# Patient Record
Sex: Female | Born: 1985
Health system: Southern US, Community
[De-identification: ages and names within clinical notes are randomized; demographics above are authoritative.]

## PROBLEM LIST (undated history)

## (undated) DIAGNOSIS — Z87448 Personal history of other diseases of urinary system: Secondary | ICD-10-CM

## (undated) DIAGNOSIS — J45909 Unspecified asthma, uncomplicated: Secondary | ICD-10-CM

## (undated) DIAGNOSIS — Z8619 Personal history of other infectious and parasitic diseases: Secondary | ICD-10-CM

## (undated) DIAGNOSIS — Z872 Personal history of diseases of the skin and subcutaneous tissue: Secondary | ICD-10-CM

## (undated) DIAGNOSIS — Z87898 Personal history of other specified conditions: Secondary | ICD-10-CM

## (undated) DIAGNOSIS — G43709 Chronic migraine without aura, not intractable, without status migrainosus: Secondary | ICD-10-CM

## (undated) DIAGNOSIS — Z8719 Personal history of other diseases of the digestive system: Secondary | ICD-10-CM

## (undated) DIAGNOSIS — Q820 Hereditary lymphedema: Secondary | ICD-10-CM

## (undated) DIAGNOSIS — Z973 Presence of spectacles and contact lenses: Secondary | ICD-10-CM

## (undated) DIAGNOSIS — IMO0002 Reserved for concepts with insufficient information to code with codable children: Secondary | ICD-10-CM

## (undated) DIAGNOSIS — D509 Iron deficiency anemia, unspecified: Secondary | ICD-10-CM

## (undated) DIAGNOSIS — N840 Polyp of corpus uteri: Secondary | ICD-10-CM

## (undated) DIAGNOSIS — D259 Leiomyoma of uterus, unspecified: Secondary | ICD-10-CM

## (undated) DIAGNOSIS — K219 Gastro-esophageal reflux disease without esophagitis: Secondary | ICD-10-CM

## (undated) DIAGNOSIS — Z8742 Personal history of other diseases of the female genital tract: Secondary | ICD-10-CM

## (undated) HISTORY — PX: WISDOM TOOTH EXTRACTION: SHX21

---
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
## 2003-04-26 ENCOUNTER — Emergency Department (HOSPITAL_COMMUNITY): Admission: EM | Admit: 2003-04-26 | Discharge: 2003-04-27 | Payer: Self-pay | Admitting: *Deleted

## 2003-08-21 ENCOUNTER — Emergency Department (HOSPITAL_COMMUNITY): Admission: EM | Admit: 2003-08-21 | Discharge: 2003-08-22 | Payer: Self-pay | Admitting: Emergency Medicine

## 2003-12-13 ENCOUNTER — Emergency Department (HOSPITAL_COMMUNITY): Admission: EM | Admit: 2003-12-13 | Discharge: 2003-12-13 | Payer: Self-pay | Admitting: Emergency Medicine

## 2004-02-18 ENCOUNTER — Ambulatory Visit (HOSPITAL_COMMUNITY): Admission: RE | Admit: 2004-02-18 | Discharge: 2004-02-18 | Payer: Self-pay | Admitting: Family Medicine

## 2004-02-18 ENCOUNTER — Ambulatory Visit: Payer: Self-pay | Admitting: Family Medicine

## 2004-02-18 ENCOUNTER — Inpatient Hospital Stay (HOSPITAL_COMMUNITY): Admission: EM | Admit: 2004-02-18 | Discharge: 2004-02-20 | Payer: Self-pay | Admitting: Family Medicine

## 2004-07-10 ENCOUNTER — Emergency Department (HOSPITAL_COMMUNITY): Admission: EM | Admit: 2004-07-10 | Discharge: 2004-07-10 | Payer: Self-pay | Admitting: Family Medicine

## 2004-12-21 ENCOUNTER — Emergency Department (HOSPITAL_COMMUNITY): Admission: EM | Admit: 2004-12-21 | Discharge: 2004-12-21 | Payer: Self-pay | Admitting: Emergency Medicine

## 2004-12-23 ENCOUNTER — Emergency Department (HOSPITAL_COMMUNITY): Admission: EM | Admit: 2004-12-23 | Discharge: 2004-12-23 | Payer: Self-pay | Admitting: Emergency Medicine

## 2004-12-24 ENCOUNTER — Ambulatory Visit (HOSPITAL_COMMUNITY): Admission: RE | Admit: 2004-12-24 | Discharge: 2004-12-24 | Payer: Self-pay | Admitting: Emergency Medicine

## 2005-01-16 ENCOUNTER — Encounter (INDEPENDENT_AMBULATORY_CARE_PROVIDER_SITE_OTHER): Payer: Self-pay | Admitting: Internal Medicine

## 2005-01-19 ENCOUNTER — Inpatient Hospital Stay (HOSPITAL_COMMUNITY): Admission: AD | Admit: 2005-01-19 | Discharge: 2005-01-19 | Payer: Self-pay | Admitting: *Deleted

## 2005-02-02 ENCOUNTER — Emergency Department (HOSPITAL_COMMUNITY): Admission: EM | Admit: 2005-02-02 | Discharge: 2005-02-02 | Payer: Self-pay | Admitting: *Deleted

## 2005-02-04 ENCOUNTER — Emergency Department (HOSPITAL_COMMUNITY): Admission: EM | Admit: 2005-02-04 | Discharge: 2005-02-04 | Payer: Self-pay | Admitting: Family Medicine

## 2005-02-06 ENCOUNTER — Emergency Department (HOSPITAL_COMMUNITY): Admission: EM | Admit: 2005-02-06 | Discharge: 2005-02-06 | Payer: Self-pay | Admitting: Family Medicine

## 2005-04-03 ENCOUNTER — Emergency Department (HOSPITAL_COMMUNITY): Admission: EM | Admit: 2005-04-03 | Discharge: 2005-04-04 | Payer: Self-pay | Admitting: Emergency Medicine

## 2005-06-19 ENCOUNTER — Emergency Department (HOSPITAL_COMMUNITY): Admission: EM | Admit: 2005-06-19 | Discharge: 2005-06-19 | Payer: Self-pay | Admitting: Emergency Medicine

## 2005-07-16 ENCOUNTER — Ambulatory Visit: Payer: Self-pay | Admitting: Internal Medicine

## 2005-08-05 ENCOUNTER — Ambulatory Visit: Payer: Self-pay | Admitting: Internal Medicine

## 2005-08-05 ENCOUNTER — Ambulatory Visit: Payer: Self-pay | Admitting: *Deleted

## 2005-08-06 ENCOUNTER — Ambulatory Visit (HOSPITAL_COMMUNITY): Admission: RE | Admit: 2005-08-06 | Discharge: 2005-08-06 | Payer: Self-pay | Admitting: Internal Medicine

## 2005-08-15 ENCOUNTER — Ambulatory Visit: Payer: Self-pay | Admitting: Internal Medicine

## 2006-02-18 ENCOUNTER — Ambulatory Visit: Payer: Self-pay | Admitting: Internal Medicine

## 2006-03-18 DIAGNOSIS — L03115 Cellulitis of right lower limb: Secondary | ICD-10-CM

## 2006-03-19 ENCOUNTER — Emergency Department (HOSPITAL_COMMUNITY): Admission: EM | Admit: 2006-03-19 | Discharge: 2006-03-19 | Payer: Self-pay | Admitting: Family Medicine

## 2006-03-19 ENCOUNTER — Encounter: Payer: Self-pay | Admitting: Vascular Surgery

## 2006-03-20 ENCOUNTER — Ambulatory Visit: Payer: Self-pay | Admitting: Internal Medicine

## 2006-03-24 ENCOUNTER — Ambulatory Visit: Payer: Self-pay | Admitting: Internal Medicine

## 2006-06-25 ENCOUNTER — Ambulatory Visit: Payer: Self-pay | Admitting: Internal Medicine

## 2006-07-17 ENCOUNTER — Ambulatory Visit: Payer: Self-pay | Admitting: Internal Medicine

## 2006-07-26 ENCOUNTER — Encounter (INDEPENDENT_AMBULATORY_CARE_PROVIDER_SITE_OTHER): Payer: Self-pay | Admitting: Internal Medicine

## 2006-07-26 DIAGNOSIS — Z77011 Contact with and (suspected) exposure to lead: Secondary | ICD-10-CM | POA: Insufficient documentation

## 2006-07-26 DIAGNOSIS — G5600 Carpal tunnel syndrome, unspecified upper limb: Secondary | ICD-10-CM | POA: Insufficient documentation

## 2006-07-26 DIAGNOSIS — G43909 Migraine, unspecified, not intractable, without status migrainosus: Secondary | ICD-10-CM

## 2006-07-26 DIAGNOSIS — J45909 Unspecified asthma, uncomplicated: Secondary | ICD-10-CM | POA: Insufficient documentation

## 2006-08-06 ENCOUNTER — Ambulatory Visit: Payer: Self-pay | Admitting: Internal Medicine

## 2006-08-15 ENCOUNTER — Ambulatory Visit (HOSPITAL_COMMUNITY): Admission: RE | Admit: 2006-08-15 | Discharge: 2006-08-15 | Payer: Self-pay | Admitting: Internal Medicine

## 2006-08-17 DIAGNOSIS — Z8619 Personal history of other infectious and parasitic diseases: Secondary | ICD-10-CM

## 2006-08-17 HISTORY — DX: Personal history of other infectious and parasitic diseases: Z86.19

## 2006-08-22 ENCOUNTER — Emergency Department (HOSPITAL_COMMUNITY): Admission: EM | Admit: 2006-08-22 | Discharge: 2006-08-22 | Payer: Self-pay | Admitting: Emergency Medicine

## 2006-09-16 ENCOUNTER — Encounter (INDEPENDENT_AMBULATORY_CARE_PROVIDER_SITE_OTHER): Payer: Self-pay | Admitting: *Deleted

## 2006-09-16 ENCOUNTER — Inpatient Hospital Stay (HOSPITAL_COMMUNITY): Admission: EM | Admit: 2006-09-16 | Discharge: 2006-09-28 | Payer: Self-pay | Admitting: Family Medicine

## 2006-09-16 ENCOUNTER — Ambulatory Visit: Payer: Self-pay | Admitting: Vascular Surgery

## 2006-09-20 ENCOUNTER — Encounter (INDEPENDENT_AMBULATORY_CARE_PROVIDER_SITE_OTHER): Payer: Self-pay | Admitting: *Deleted

## 2006-10-08 ENCOUNTER — Ambulatory Visit: Payer: Self-pay | Admitting: Internal Medicine

## 2006-10-09 ENCOUNTER — Encounter (INDEPENDENT_AMBULATORY_CARE_PROVIDER_SITE_OTHER): Payer: Self-pay | Admitting: Internal Medicine

## 2006-10-09 LAB — CONVERTED CEMR LAB
ALT: <8 units/L (ref 0–35)
AST: 11 units/L (ref 0–37)
Basophils Absolute: 0.1 10*3/uL (ref 0.0–0.1)
Basophils Relative: 1 % (ref 0–1)
Calcium: 8.7 mg/dL (ref 8.4–10.5)
Chloride: 108 meq/L (ref 96–112)
Creatinine, Ser: 0.89 mg/dL (ref 0.40–1.20)
MCHC: 30 g/dL (ref 30.0–36.0)
Monocytes Absolute: 0.6 10*3/uL (ref 0.2–0.7)
Neutro Abs: 2.7 10*3/uL (ref 1.7–7.7)
Neutrophils Relative %: 44 % (ref 43–77)
Platelets: 425 10*3/uL — ABNORMAL HIGH (ref 150–400)
Potassium: 4.1 meq/L (ref 3.5–5.3)
RDW: 14.1 % — ABNORMAL HIGH (ref 11.5–14.0)

## 2006-10-13 ENCOUNTER — Ambulatory Visit: Payer: Self-pay | Admitting: Internal Medicine

## 2006-10-13 ENCOUNTER — Other Ambulatory Visit: Admission: RE | Admit: 2006-10-13 | Discharge: 2006-10-13 | Payer: Self-pay | Admitting: Family Medicine

## 2006-11-06 ENCOUNTER — Encounter: Payer: Self-pay | Admitting: Internal Medicine

## 2006-12-03 ENCOUNTER — Encounter (INDEPENDENT_AMBULATORY_CARE_PROVIDER_SITE_OTHER): Payer: Self-pay | Admitting: *Deleted

## 2007-01-19 ENCOUNTER — Telehealth (INDEPENDENT_AMBULATORY_CARE_PROVIDER_SITE_OTHER): Payer: Self-pay | Admitting: Internal Medicine

## 2007-03-28 ENCOUNTER — Encounter (INDEPENDENT_AMBULATORY_CARE_PROVIDER_SITE_OTHER): Payer: Self-pay | Admitting: Internal Medicine

## 2007-03-30 ENCOUNTER — Encounter (INDEPENDENT_AMBULATORY_CARE_PROVIDER_SITE_OTHER): Payer: Self-pay | Admitting: Internal Medicine

## 2007-10-23 ENCOUNTER — Emergency Department (HOSPITAL_COMMUNITY): Admission: EM | Admit: 2007-10-23 | Discharge: 2007-10-24 | Payer: Self-pay | Admitting: Emergency Medicine

## 2008-09-28 IMAGING — CR DG FOOT COMPLETE 3+V*L*
3 series · 3 of 3 positions shown · non-contrast
Comparison: none

HISTORY: Left foot pain and swelling

LEFT FOOT 3 VIEWS:
Mineralization normal.
Joint spaces preserved.
Diffuse soft tissue swelling of left foot, greatest at dorsum.
No fracture, dislocation, or bone destruction.
No radiopaque foreign body or soft tissue gas.

[t foot ap left]
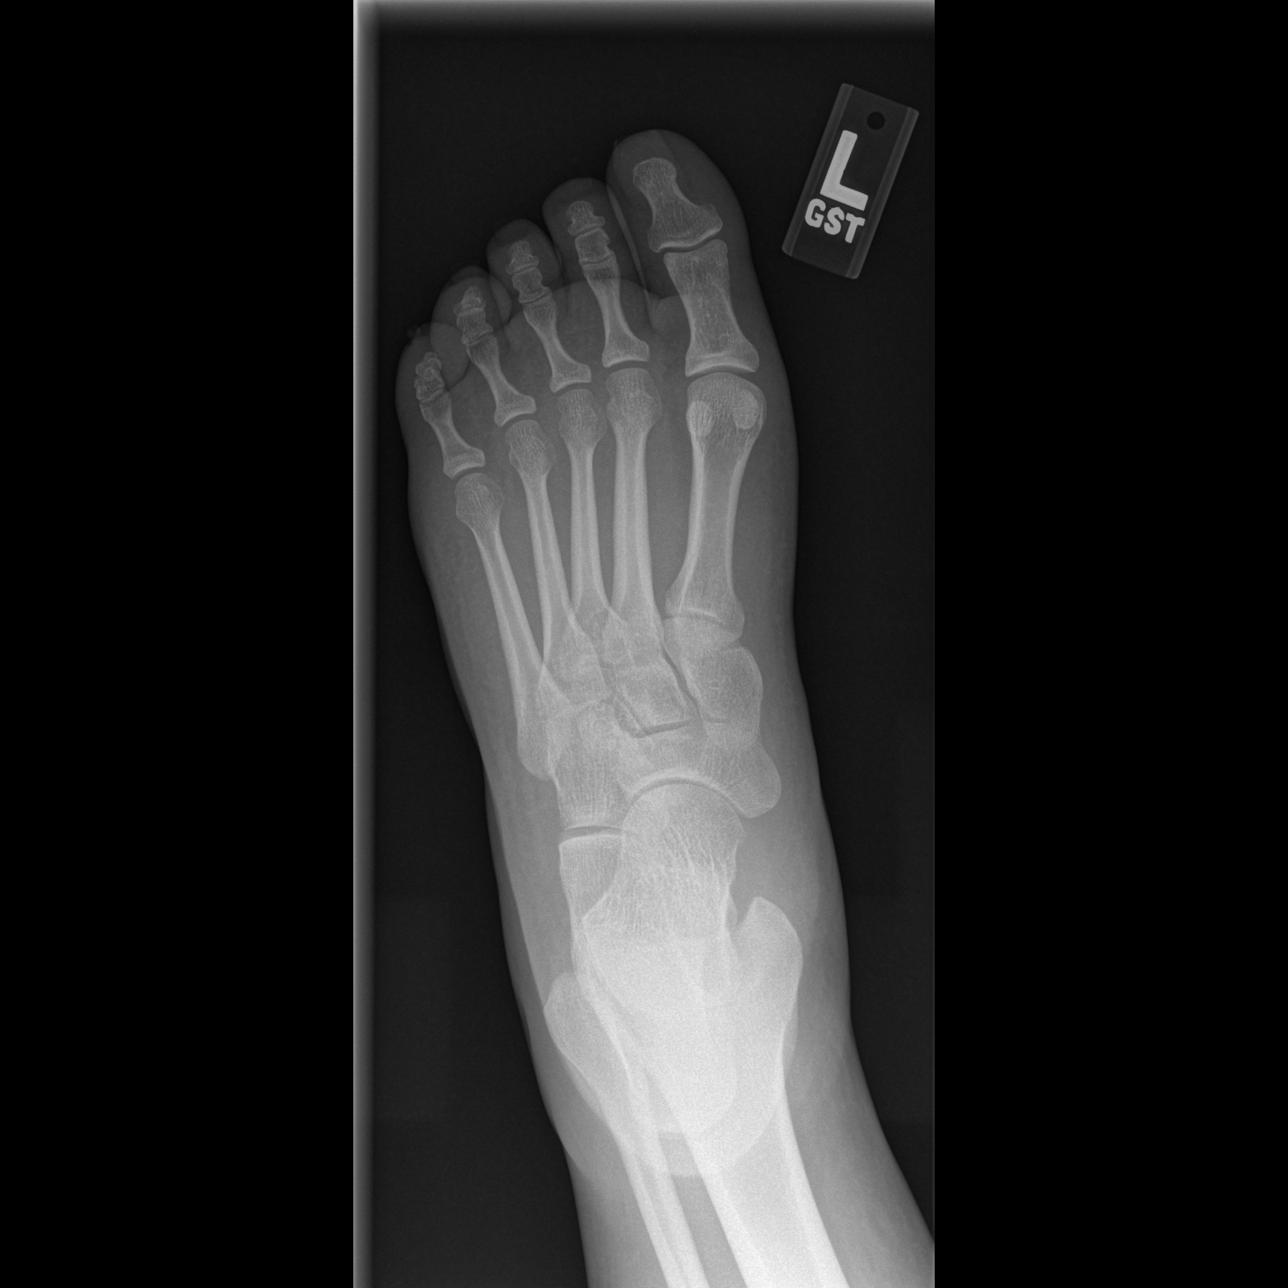

[t foot oblique left]
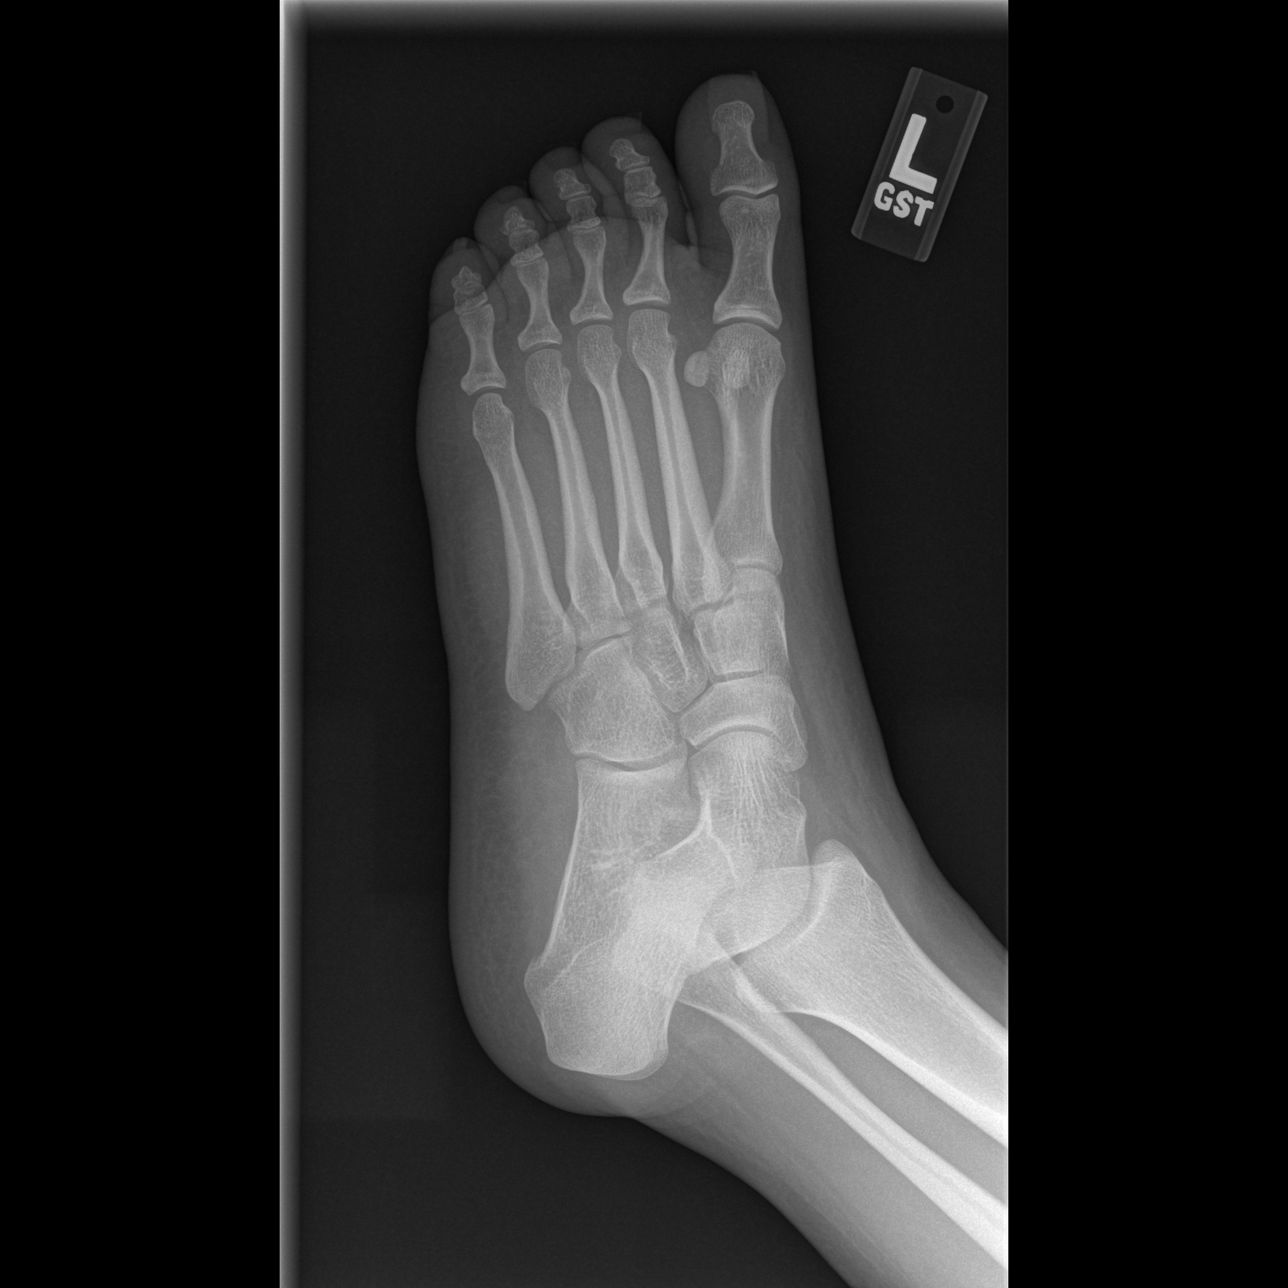

[t foot lat left]
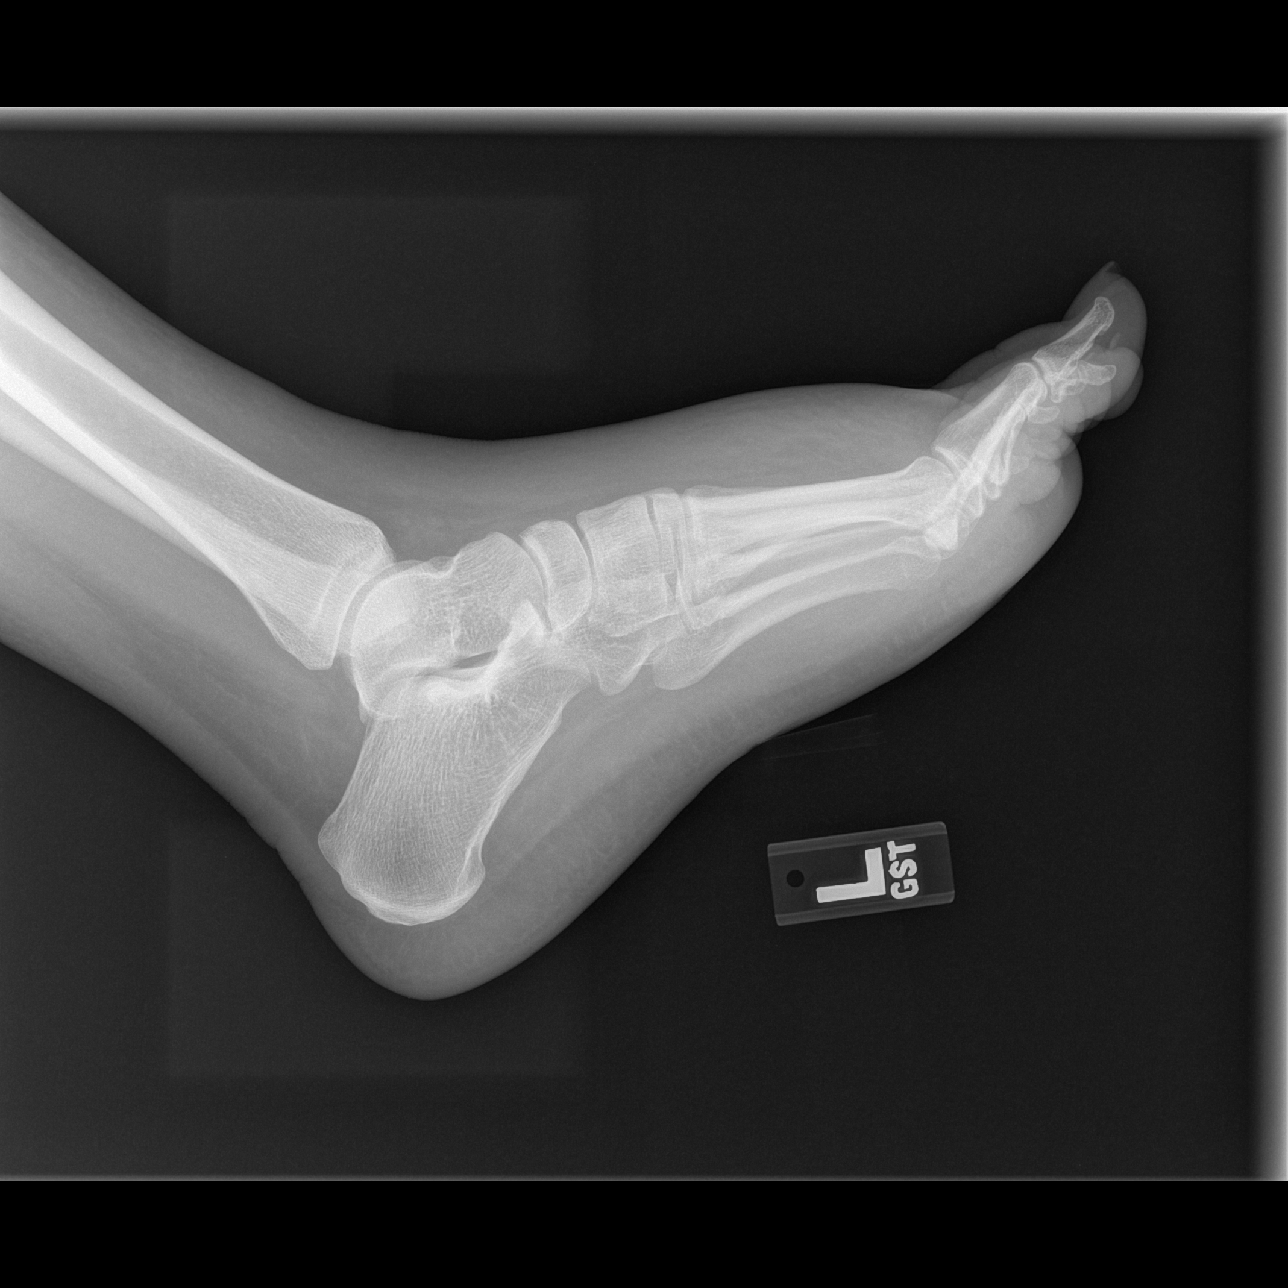

[3 of 3 positions shown; findings below may reference images not displayed]

IMPRESSION: Significant soft tissue swelling without focal bony abnormality.

## 2008-10-24 ENCOUNTER — Emergency Department (HOSPITAL_COMMUNITY): Admission: EM | Admit: 2008-10-24 | Discharge: 2008-10-25 | Payer: Self-pay | Admitting: Emergency Medicine

## 2008-10-30 ENCOUNTER — Emergency Department (HOSPITAL_COMMUNITY): Admission: EM | Admit: 2008-10-30 | Discharge: 2008-10-30 | Payer: Self-pay | Admitting: Emergency Medicine

## 2009-02-25 ENCOUNTER — Emergency Department (HOSPITAL_COMMUNITY): Admission: EM | Admit: 2009-02-25 | Discharge: 2009-02-25 | Payer: Self-pay | Admitting: Emergency Medicine

## 2009-04-26 ENCOUNTER — Emergency Department (HOSPITAL_BASED_OUTPATIENT_CLINIC_OR_DEPARTMENT_OTHER): Admission: EM | Admit: 2009-04-26 | Discharge: 2009-04-27 | Payer: Self-pay | Admitting: Emergency Medicine

## 2009-04-27 ENCOUNTER — Ambulatory Visit: Payer: Self-pay | Admitting: Diagnostic Radiology

## 2009-09-27 ENCOUNTER — Emergency Department (HOSPITAL_BASED_OUTPATIENT_CLINIC_OR_DEPARTMENT_OTHER): Admission: EM | Admit: 2009-09-27 | Discharge: 2009-09-27 | Payer: Self-pay | Admitting: Emergency Medicine

## 2010-03-05 ENCOUNTER — Emergency Department (HOSPITAL_BASED_OUTPATIENT_CLINIC_OR_DEPARTMENT_OTHER)
Admission: EM | Admit: 2010-03-05 | Discharge: 2010-03-05 | Payer: Self-pay | Source: Home / Self Care | Admitting: Emergency Medicine

## 2010-04-21 ENCOUNTER — Inpatient Hospital Stay (HOSPITAL_COMMUNITY)
Admission: AD | Admit: 2010-04-21 | Discharge: 2010-04-21 | Disposition: A | Discharge status: Home / Self Care | Payer: BC Managed Care – PPO | Source: Ambulatory Visit | Attending: Obstetrics and Gynecology | Admitting: Obstetrics and Gynecology

## 2010-04-21 DIAGNOSIS — B9689 Other specified bacterial agents as the cause of diseases classified elsewhere: Secondary | ICD-10-CM | POA: Insufficient documentation

## 2010-04-21 DIAGNOSIS — N760 Acute vaginitis: Secondary | ICD-10-CM | POA: Insufficient documentation

## 2010-04-21 DIAGNOSIS — N938 Other specified abnormal uterine and vaginal bleeding: Secondary | ICD-10-CM | POA: Insufficient documentation

## 2010-04-21 DIAGNOSIS — A499 Bacterial infection, unspecified: Secondary | ICD-10-CM | POA: Insufficient documentation

## 2010-04-21 DIAGNOSIS — N949 Unspecified condition associated with female genital organs and menstrual cycle: Secondary | ICD-10-CM | POA: Insufficient documentation

## 2010-04-21 LAB — CBC
Hemoglobin: 11.5 g/dL — ABNORMAL LOW (ref 12.0–15.0)
MCH: 25.9 pg — ABNORMAL LOW (ref 26.0–34.0)
MCHC: 31.5 g/dL (ref 30.0–36.0)
MCV: 82.2 fL (ref 78.0–100.0)

## 2010-04-21 LAB — WET PREP, GENITAL: Yeast Wet Prep HPF POC: NONE SEEN

## 2010-04-21 LAB — URINALYSIS, ROUTINE W REFLEX MICROSCOPIC
Ketones, ur: NEGATIVE mg/dL
Leukocytes, UA: NEGATIVE
Nitrite: NEGATIVE
Protein, ur: NEGATIVE mg/dL
Urobilinogen, UA: 1 mg/dL (ref 0.0–1.0)
pH: 6.5 (ref 5.0–8.0)

## 2010-04-21 LAB — URINE MICROSCOPIC-ADD ON

## 2010-04-23 LAB — GC/CHLAMYDIA PROBE AMP, GENITAL: GC Probe Amp, Genital: NEGATIVE

## 2010-05-10 ENCOUNTER — Other Ambulatory Visit: Payer: Self-pay | Admitting: Physician Assistant

## 2010-05-10 ENCOUNTER — Encounter: Payer: Self-pay | Admitting: Physician Assistant

## 2010-05-10 ENCOUNTER — Encounter (INDEPENDENT_AMBULATORY_CARE_PROVIDER_SITE_OTHER): Payer: BC Managed Care – PPO | Admitting: Physician Assistant

## 2010-05-10 DIAGNOSIS — N926 Irregular menstruation, unspecified: Secondary | ICD-10-CM

## 2010-05-10 DIAGNOSIS — R102 Pelvic and perineal pain: Secondary | ICD-10-CM

## 2010-05-10 DIAGNOSIS — N939 Abnormal uterine and vaginal bleeding, unspecified: Secondary | ICD-10-CM

## 2010-05-10 LAB — CONVERTED CEMR LAB
Hemoglobin: 11.9 g/dL — ABNORMAL LOW (ref 12.0–15.0)
MCHC: 31.2 g/dL (ref 30.0–36.0)
MCV: 82.5 fL (ref 78.0–100.0)
RDW: 13.7 % (ref 11.5–15.5)

## 2010-05-15 ENCOUNTER — Ambulatory Visit (HOSPITAL_COMMUNITY): Payer: BC Managed Care – PPO

## 2010-05-17 ENCOUNTER — Ambulatory Visit (HOSPITAL_COMMUNITY)
Admission: RE | Admit: 2010-05-17 | Discharge: 2010-05-17 | Disposition: A | Discharge status: Home / Self Care | Payer: BC Managed Care – PPO | Source: Ambulatory Visit | Attending: Physician Assistant | Admitting: Physician Assistant

## 2010-05-17 DIAGNOSIS — R102 Pelvic and perineal pain: Secondary | ICD-10-CM

## 2010-05-17 DIAGNOSIS — N949 Unspecified condition associated with female genital organs and menstrual cycle: Secondary | ICD-10-CM | POA: Insufficient documentation

## 2010-05-31 ENCOUNTER — Ambulatory Visit: Payer: BC Managed Care – PPO | Admitting: Physician Assistant

## 2010-05-31 DIAGNOSIS — N949 Unspecified condition associated with female genital organs and menstrual cycle: Secondary | ICD-10-CM

## 2010-06-08 NOTE — Progress Notes (Signed)
NAME:  Jacqueline Woodard, Jacqueline Woodard NO.:  [id number]  MEDICAL RECORD NO.:  [id number]           PATIENT TYPE:  A  LOCATION:  [facility]                   FACILITY:  [facility]  PHYSICIAN:  Maylon Cos, CNM    DATE OF BIRTH:  10-Dec-1985  DATE OF SERVICE:  05/31/2010                                 CLINIC NOTE  REASON FOR TODAY'S VISIT:  Ultrasound results and followup for previous diagnosis of abnormal uterine bleeding.  HISTORY OF PRESENT ILLNESS:  The patient is a 25 year old nulligravida patient who has been under my care for the past several months for complaints of abnormal uterine bleeding.  At her last visit in February, she was started on oral contraceptive pills, Loestrin 1.5 to control her bleeding.  She returns today and she was also ordered a pelvic ultrasound.  She returns today for the results of this ultrasound and follow up on the effectiveness of her OCPs for abnormal bleeding.  The patient reports that her bleeding has completed resolved.  She is having a regular menstrual withdrawal bleeds on her Loestrin 1.5 and desires to continue.  Furthermore, her pelvic ultrasound was normal with the exception of a questionable clot in the endocervical canal.  This was confirmed by the patient stating that she did in fact passed a large clot within 48 hours following her cultures.  ASSESSMENT: 1. Abnormal uterine bleeding. 2. Overweight.  PLAN:  The patient is given a prescription for refills on her Ortho Tri- Cyclen Lo.  Continue to take them one daily, continue her efforts for weight loss and the patient should follow up in 2-3 months for an annual exam and Pap smear or sooner if need be.          ______________________________ Maylon Cos, CNM    SS/MEDQ  D:  05/31/2010  T:  06/01/2010  Job:  [id number]

## 2010-06-08 NOTE — Progress Notes (Signed)
NAME:  Jacqueline Woodard, Jacqueline Woodard NO.:  [id number]  MEDICAL RECORD NO.:  [id number]           PATIENT TYPE:  A  [facility]ATION:  [facility]                   FACILITY:  [facility]  PHYSICIAN:  Maylon Cos, CNM    DATE OF BIRTH:  [birth year]  DATE OF SERVICE:  05/10/2010                                 CLINIC NOTE  REASON FOR ADMISSION:  The patient is seen in the GYN Clinic at [facility].  The patient presents today for followup from [facility] with diagnosis of abnormal uterine bleeding.  HISTORY OF PRESENT ILLNESS:  The patient is a 25 year old nulligravid patient who had regular monthly cycles up until December 2011.  Her first day of her last menstrual period started on March 02, 2010, and she did not stop with either bleeding or spotting until April 26, 2010.  She was seen in [facility] on April 21, 2010, and at that time, she described that she was bleeding with having to change a pad less than 5 times a day.  She was changing as scantily covered tampon 3-4 times a day and then in the 24 hours prior to her being in [facility], she would only change the tampon twice.  At the time of her visit, the provider noted that she had pink-tinge discharge.  Cultures and wet prep were obtained.  Diagnosis of bacterial vaginosis was made and the patient was treated at that time.  Subsequently that culture results for gonorrhea and Chlamydia have come back negative.  She presents today as follow up.  In the interval, she has been without any bleeding now for approximately 2 weeks.  PHYSICAL EXAMINATION:  VITAL SIGNS:  Within normal limits.  Her blood pressure is 111/78, her pulse is 73, temperature is 98.4, and her weight today is 193.3. GENERAL:  Tifanny is a pleasant 25 year old African American female, who is overweight.  She does appear to be her stated age of 24 and she is in no apparent distress. HEENT:  Grossly normal and there is no palpable enlargement of  her thyroid. ABDOMEN:  Soft and obese, nontender to palpation.  Bimanual exam reveals nonenlarged, nontender uterus with no cervical motion tenderness. Adnexa are not enlarged and nontender.  ASSESSMENT: 1. Abnormal uterine bleeding. 2. Overweight.  PLAN:  I have discussed with the patient that given her previously normal menstrual cycles, this could be an isolated event.  However, we will further evaluate this problem by obtaining a pelvic ultrasound looking for fibroids or polyps as well as assess CBC given the length of her bleeding and a TSH to evaluate if there are any underlying fibroid problems causing her menstrual abnormalities.  The patient is strongly encouraged to help with her weight gain to aid in weight loss, as that could help with her menstrual cycle irregularities.  Shed off has been offered.  She is not currently on any birth control.  She is also being offered oral contraceptive pills to regulate her cycle and she does desire this.  She has been giving two samples of Loestrin 1.5 and we have gone over instructions on proper use of pills and missed pills.  FOLLOWUP:  The patient  should follow up in 3 weeks after her ultrasound to discuss the results of her ultrasound and also her blood work.  Also to assess her next period, which should have occurred by the time we see her again.  She can also return sooner if need be.          ______________________________ Maylon Cos, CNM    SS/MEDQ  D:  05/10/2010  T:  05/11/2010  Job:  [id number]

## 2010-06-19 LAB — WET PREP, GENITAL
Trich, Wet Prep: NONE SEEN
WBC, Wet Prep HPF POC: NONE SEEN

## 2010-06-19 LAB — POCT URINALYSIS DIP (DEVICE)
Glucose, UA: NEGATIVE mg/dL
Hgb urine dipstick: NEGATIVE
Nitrite: NEGATIVE
Protein, ur: NEGATIVE mg/dL
Urobilinogen, UA: 1 mg/dL (ref 0.0–1.0)
pH: 5.5 (ref 5.0–8.0)

## 2010-06-19 LAB — GC/CHLAMYDIA PROBE AMP, GENITAL: GC Probe Amp, Genital: NEGATIVE

## 2010-06-22 ENCOUNTER — Emergency Department (HOSPITAL_BASED_OUTPATIENT_CLINIC_OR_DEPARTMENT_OTHER)
Admission: EM | Admit: 2010-06-22 | Discharge: 2010-06-22 | Disposition: A | Discharge status: Home / Self Care | Payer: BC Managed Care – PPO | Attending: Emergency Medicine | Admitting: Emergency Medicine

## 2010-06-22 DIAGNOSIS — G43909 Migraine, unspecified, not intractable, without status migrainosus: Secondary | ICD-10-CM | POA: Insufficient documentation

## 2010-06-22 DIAGNOSIS — J45909 Unspecified asthma, uncomplicated: Secondary | ICD-10-CM | POA: Insufficient documentation

## 2010-06-23 LAB — BASIC METABOLIC PANEL
Calcium: 8.6 mg/dL (ref 8.4–10.5)
GFR calc Af Amer: >60 mL/min (ref >60)
GFR calc non Af Amer: >60 mL/min (ref >60)
Potassium: 3.8 mEq/L (ref 3.5–5.1)
Sodium: 138 mEq/L (ref 135–145)

## 2010-06-23 LAB — GLUCOSE, CAPILLARY: Glucose-Capillary: 86 mg/dL (ref 70–99)

## 2010-06-23 LAB — POCT I-STAT, CHEM 8
BUN: 10 mg/dL (ref 6–23)
Calcium, Ion: 1.2 mmol/L (ref 1.12–1.32)
Chloride: 108 mEq/L (ref 96–112)
HCT: 38 % (ref 36.0–46.0)
Potassium: 3.9 mEq/L (ref 3.5–5.1)

## 2010-06-23 LAB — DIFFERENTIAL
Basophils Absolute: 0.1 10*3/uL (ref 0.0–0.1)
Eosinophils Relative: 2 % (ref 0–5)
Lymphocytes Relative: 46 % (ref 12–46)
Lymphs Abs: 3.7 10*3/uL (ref 0.7–4.0)
Monocytes Absolute: 0.7 10*3/uL (ref 0.1–1.0)
Neutro Abs: 3.4 10*3/uL (ref 1.7–7.7)

## 2010-06-23 LAB — CBC
HCT: 35.9 % — ABNORMAL LOW (ref 36.0–46.0)
Hemoglobin: 11.9 g/dL — ABNORMAL LOW (ref 12.0–15.0)
Platelets: 241 10*3/uL (ref 150–400)
RDW: 13.6 % (ref 11.5–15.5)
WBC: 8.1 10*3/uL (ref 4.0–10.5)

## 2010-06-23 LAB — RAPID URINE DRUG SCREEN, HOSP PERFORMED: Tetrahydrocannabinol: NOT DETECTED

## 2010-06-23 LAB — POCT PREGNANCY, URINE: Preg Test, Ur: NEGATIVE

## 2010-06-23 LAB — TRICYCLICS SCREEN, URINE: TCA Scrn: NOT DETECTED

## 2010-06-23 LAB — ETHANOL: Alcohol, Ethyl (B): 74 mg/dL — ABNORMAL HIGH (ref 0–10)

## 2010-06-26 ENCOUNTER — Emergency Department (HOSPITAL_BASED_OUTPATIENT_CLINIC_OR_DEPARTMENT_OTHER)
Admission: EM | Admit: 2010-06-26 | Discharge: 2010-06-26 | Disposition: A | Discharge status: Home / Self Care | Payer: BC Managed Care – PPO | Attending: Emergency Medicine | Admitting: Emergency Medicine

## 2010-06-26 DIAGNOSIS — J329 Chronic sinusitis, unspecified: Secondary | ICD-10-CM | POA: Insufficient documentation

## 2010-06-26 DIAGNOSIS — G43909 Migraine, unspecified, not intractable, without status migrainosus: Secondary | ICD-10-CM | POA: Insufficient documentation

## 2010-06-26 DIAGNOSIS — J45909 Unspecified asthma, uncomplicated: Secondary | ICD-10-CM | POA: Insufficient documentation

## 2010-06-26 LAB — RAPID STREP SCREEN (MED CTR MEBANE ONLY): Streptococcus, Group A Screen (Direct): NEGATIVE

## 2010-07-31 NOTE — Discharge Summary (Signed)
NAME:  Jacqueline Woodard, Jacqueline Woodard             ACCOUNT NO.:  [id number]   MEDICAL RECORD NO.:  [id number]          PATIENT TYPE:  INP   LOCATION:  5730                         FACILITY:  [facility]   PHYSICIAN:  Mobolaji B. Bakare, M.D.DATE OF BIRTH:  [birth year]   DATE OF ADMISSION:  09/15/2006  DATE OF DISCHARGE:                               DISCHARGE SUMMARY   PRIMARY CARE PHYSICIAN:  Unassigned.   DATE OF DISCHARGE:  This is an interim dictation.   FINAL DIAGNOSES:  1. Left leg cellulitis.  2. Clostridium difficile colitis.  3. Acute renal failure.  4. Tinea pedis, left foot.  5. Normocytic anemia.  6. Right ovarian cyst.  7. Asthma.  This was quiescent during this hospitalization.  8. Chronic bilateral lymphedema.   PROCEDURES:  1. MRI of the lower extremity done on September 16, 2006, showed soft tissue      swelling along the dorsum of the foot with edema involving      subcutaneous tissue consistent with cellulitis.  There are 2 areas      of focal fluid collection representing microabscesses.  There was      no large fluid collection.  2. CT scan of the abdomen and pelvis done on September 20, 2006, showed      anasarca, no specific fluid in the pelvis.  There was a low-density      about 3.5 mm lesion in the right ovary, which may represent simple      cyst.  A follow-up ultrasound of the pelvis was recommended.  3. Ultrasound of the pelvis showed a 3.0 cm right ovarian cyst.  4. Chest x-ray showed no evidence of acute cardiopulmonary disease.  5. Lower extremity Dopplers were negative for deep venous thrombosis.   CONSULTATIONS:  Orthopedic consult provided by Feliberto Gottron. Turner Daniels, M.D.   BRIEF HISTORY:  Please refer to the admission H&P.  Jacqueline Woodard is a 25-  year-old African-American female with history of chronic bilateral  lymphedema and recurrent cellulitis.  She presented to the emergency  room with left foot pain, fever, and leukocytosis.  She had left lower  extremity swelling and  tenderness on examination, which was consistent  with cellulitis.  She had a lower extremity Doppler done, which was  negative for deep venous thrombosis.  The patient had nausea and  vomiting prior to presentation but there was no diarrhea or abdominal  pain.  At that time the fever was not associated with chills.  She was  admitted for further evaluation and started on IV Rocephin.   HOSPITAL COURSE:  Problem 1.  LEFT LEG CELLULITIS:  The patient's antibiotic was switched  from Rocephin to Zosyn and vancomycin.  She had an MRI of the leg to  rule out deep-seated abscesses.  The results are as noted above.  She  was seen in consultation by Dr. Turner Daniels, who opined that there is no  drainable abscess and recommended continuation of IV antibiotics.  She  completed IV Zosyn for 7 days and will complete IV vancomycin for about  10 days.  IV Zosyn was discontinued when she developed C. difficile  colitis and lower extremity cellulitis was improving at that point.  Currently there is no more redness and swelling in left leg has  subsided.   Problem 2.  CLOSTRIDIUM DIFFICILE COLITIS:  About the third day of  admission the patient developed diarrhea.  About the fourth day of  admission it was noted that after two days of IV antibiotics,  leukocytosis and fever subsided but on the fourth day of admission she  again had low-grade fever and increasing leukocytosis associated with  diarrhea, nausea and vomiting.  We obtained CT scan of the abdomen.  There was no evidence of colitis and no acute finding.  Stool was sent  for culture and C. Difficile.  C. difficile came back positive.  The  patient was started on Flagyl 500 mg t.i.d. and Zosyn was discontinued.  Of note is that she had completed 7 days of treatment with Zosyn and  left leg cellulitis was improving at that point.   Problem 3.  ACUTE RENAL FAILURE:  The patient was admitted with normal  BUN and creatinine, with creatinine less than 1  at the time of  admission.  About the fourth day of admission she had elevated BUN and  creatinine with creatinine of 6.  The patient during the course of  hospitalization had episode of hypotension, nausea, vomiting and  diarrhea.  Additionally, she was on ibuprofen for pain relief of her  left leg cellulitis.  Hence, all this could have contributed to the  acute renal failure.  She was nonoliguric.  Urine output was monitored  closely.  She was given IV hydration and nephrotoxic medications were  discontinued.  BUN and creatinine are improving at the time of  dictation.  I do anticipate normalization of creatinine.  There was no  hydronephrosis on CT scan.  Fractional excretion of sodium was 3.99.   Problem 4.  TINEA PEDIS:  The patient was treated with Lamisil powder.   Problem 5.  NORMOCYTIC ANEMIA:  She does have a history of chronic  anemia.  Anemia panel obtained during this hospitalization indicated low  iron, less than 10.  Ferritin was normal.  However, TIBC was unable to  be calculated.  This is thought to be a combination of iron-deficiency  anemia and hemodilution.  Stool Hemoccult was negative.  The patient was  started on iron sulfate.  She may have menorrhagia.  I have indicated  that she should follow up with a gynecologist upon discharge.   Problem 6.  ASTHMA:  This remained quiescent during this  hospitalization.   Addendum will be made to this dictation at the time of discharge with  discharge medications and laboratory data.      Mobolaji B. Corky Downs, M.D.  Electronically Signed     MBB/MEDQ  D:  09/25/2006  T:  09/25/2006  Job:  [id number]

## 2010-07-31 NOTE — Discharge Summary (Signed)
NAME:  Jacqueline Woodard, Jacqueline Woodard             ACCOUNT NO.:  [id number]   MEDICAL RECORD NO.:  [id number]          PATIENT TYPE:  INP   LOCATION:  5730                         FACILITY:  [facility]   PHYSICIAN:  Mobolaji B. Bakare, M.D.DATE OF BIRTH:  1985-07-26   DATE OF ADMISSION:  09/15/2006  DATE OF DISCHARGE:                               DISCHARGE SUMMARY   PRIMARY CARE PHYSICIAN:  Unassigned.   FINAL DIAGNOSES:  1. Left leg cellulitis.  2. Clostridium difficile colitis.  3. Acute renal failure.  4. Tinea pedis, left foot.  5. Normocytic anemia.  6. Right ovarian cyst.  7. Asthma.  This was quiescent during hospitalization.  8. Chronic lymphadenia.   PROCEDURES:  1. MRI of the lower extremity done on the September 16, 2006, showed soft      tissue swelling along the dorsum of the foot with __________      subcutaneous tissue __________  consistent with cellulitis. There      are two areas of focal fluid collection __________  abscesses.      There was no large __________ collection.  2. CT scan of the abdomen and pelvis done on September 20, 2006, showed      anasarca  __________  3. CT scan without contrast:  Nonspecific  __________  in the pelvis.      Low-density 3.5 mm lesion in the right ovary __________  simple      cyst.  Followup ultrasound of the pelvis recommended.  4. Ultrasound of the pelvis showed __________ ovarian cyst __________      peritoneal fluid.  5. Chest x-ray showed no evidence of acute cardiopulmonary disease.   CONSULTATIONS:  Orthopedic consult provided by Feliberto Gottron. Turner Daniels, M.D.   BRIEF HISTORY:  Please refer to the admission H&P.  [name] is a  25 year old African-American female with __________ left foot pain and  fever __________ She did not __________ . She has a lower extremity  __________ showed DVT.  The patient was __________   Antibiotic was switched to __________ She was seen by orthopedic surgeon  who opined that __________ treatment.  The patient  became afebrile  __________  She completed IV Rocephin 7 days, and she was __________ IV  vancomycin __________   ACUTE RENAL FAILURE:  The patient has developed diarrhea mainly because  of __________  developed temperature, low grade, first postoperative  day.  She __________ pain.  We obtained a CT scan of the __________  There was no evidence of colitis __________ IV contrast.  The CT scan of  the pelvis did show __________ right ovarian cyst which was __________  ultrasound recommended b l   P.o. Flagyl 500 mg b.i.d. which __________   TINEA PEDIS:  The patient was noted to have tinea pedis, and she has  been treated with topical Lamisil.   CHRONIC ANEMIA:  She does have history of chronic anemia, and we  obtained anemia panel which indicated low iron less than 10.  Ferritin  was normal.  TIBC was unable to be calculated.  This is thought to be a  combination of hemodilution and  iron-deficiency anemia.  Stool hemoccult  is negative.  She could have __________ bleed with significant  __________  loss and has been asked to follow up with a gynecologist.   ASTHMA:  Remained quiescent during hospitalization.   Addendum to this dictation will be added at the time of discharge.      Mobolaji B. Corky Downs, M.D.     MBB/MEDQ  D:  09/22/2006  T:  09/22/2006  Job:  [id number]

## 2010-07-31 NOTE — H&P (Signed)
NAME:  Jacqueline Woodard, Jacqueline Woodard NO.:  [id number]   MEDICAL RECORD NO.:  [id number]          PATIENT TYPE:  INP   LOCATION:  5729                         FACILITY:  [facility]   PHYSICIAN:  Hettie Holstein, D.O.    DATE OF BIRTH:  [birth year]   DATE OF ADMISSION:  09/15/2006  DATE OF DISCHARGE:                              HISTORY & PHYSICAL   PRIMARY CARE PHYSICIAN:  [facility].   CHIEF COMPLAINT:  Left foot pain.   HISTORY OF PRESENT ILLNESS:  [name] is a 25 year old morbidly obese  African American female with a previous history of chronic lymphedema of  her right foot for the past couple of years who had been in her usual  state of health until awakening this morning when she developed severe  left foot pain and swelling.  She presented to the emergency department  with high fevers, elevated white count, some nausea and vomiting.  She  did continued to have palpable dorsalis pedis and posterior tibial  pulses.  Her foot was exquisitely tender, otherwise. She was treated in  January for a similar event where she underwent Doppler studies that  revealed no evidence of clot.  She denies any recent trauma.  In the  emergency department she had appearance of tinea pedis on examination.   PAST MEDICAL HISTORY:  Her past medical history is as noted above.  Review of the E-Chart reveals that she has MRI findings for a Chiari  malformation as well as asthma that appears to be controlled according  to the family.   MEDICATIONS:  She is on asthma treatments, inhalers at home.  Otherwise,  no other medications.   ALLERGIES:  NO KNOWN DRUG ALLERGIES.   SOCIAL HISTORY:  She does not drink or smoke.  She works at a [employer].  Her mom is present with her at this time.   FAMILY HISTORY:  Noncontributory.  Prior H&P as dictated by the teaching  service indicated a family history for coronary disease, however.  The  patient's mom states that this was a false alarm and not the  case.  Diabetes runs in the family.   REVIEW OF SYSTEMS:  She has been in her usual state of health.  Otherwise, no other complaints.  Chronic lower extremity edema, with  respect to her right foot.  Otherwise, only nausea, vomiting with her  presenting illness.   PHYSICAL EXAMINATION:  VITAL SIGNS:  In the emergency department her T-  max was 103.3, blood pressure 127/91, heart rate 120s, respirations 22,  O2 saturation 98% on room air.  GENERAL:  The patient appears nontoxic, though she does appear to be  quite uncomfortable and in a considerable amount of pain.  HEENT:  Head  to be normocephalic, atraumatic.  Extraocular muscles intact.  NECK:  Supple, nontender, no palpable thyromegaly or mass.  CARDIOVASCULAR:  Normal S1 and S2.  LUNGS:  Clear bilaterally.  She exhibits normal effort.  There is no  dullness to percussion.  ABDOMEN:  Obese, nontender.  No rebound or guarding.  No suprapubic or  costovertebral angle tenderness.  EXTREMITIES:  Chronic right lower extremity lymphedema as reported by  the family which they state is unchanged.  The left lower extremity is  swollen and diffusely tender.  Difficult to discern a demarcation of  erythema, though her calf on down to her foot is hot to touch and quite  tender.  Her joints are not exquisitely tender to passive range of  motion.  She does have the appearance of the tinea pedis on the plantar  surface of her left foot.  Her interdigital webs were inspected, and no  overt openings or pus drainage.   LABORATORY DATA:  In the emergency department her WBC was 19.1,  hemoglobin 11.7, platelet count 300, MCV of 80.  Sodium 133, potassium  3.1, BUN 12, creatinine 1.0, glucose 176.  Urinalysis was negative, with  the exception of ketones and protein.  ESR was 21.   ASSESSMENT:  1. Cellulitis of the left foot, could be bacterial versus fungal.  2. Tinea pedis.  3. Chronic lymphedema of her right lower extremity.  4.  Hyperglycemia.  5. Obesity.  6. Asthma.  7. Hypokalemia.  8. Proteinuria.   PLAN:  We are going to admit [name] for empiric IV antibiotics.  Blood cultures have been taken.  We will follow her clinical course and  response.  Will treat her tinea pedis topically as well and follow  clinical course.  Administer gentle IV hydration with potassium  supplementation.      Hettie Holstein, D.O.  Electronically Signed     ESS/MEDQ  D:  09/16/2006  T:  09/16/2006  Job:  [id number]

## 2010-07-31 NOTE — Discharge Summary (Signed)
NAMEMarland Kitchen  Jacqueline Woodard, Jacqueline Woodard NO.:  [id number]   MEDICAL RECORD NO.:  [id number]          PATIENT TYPE:  INP   LOCATION:  5730                         FACILITY:  [facility]   PHYSICIAN:  Beckey Rutter, MD  DATE OF BIRTH:  Oct 16, 1985   DATE OF ADMISSION:  09/15/2006  DATE OF DISCHARGE:  09/28/2006                               DISCHARGE SUMMARY   HOSPITAL COURSE IN THE LAST 4 DAYS:  The patient continued to improve in  her renal function.  She had borderline low potassium that was repleted.  Today's creatinine is 2.8.  The patient is eager to leave the hospital  and I think she is stable enough to be released to continue on PO fluids  and to continue on the medications listed below.  Please refer to the  previously dictated discharge summary for the detailed hospital course.   DISCHARGE MEDICATIONS:  1. Flagyl p.o. 500 mg 3 times a day.  2. Flora-A 1 tab daily.  3. Lamisil cream 1% apply b.i.d.  4. Albuterol MDI p.r.n.   DISCHARGE DIAGNOSIS:  Please refer to previously dictated discharge  summary.  Her acute renal failure is improved and continues to improve,  now she is stable for discharge.   DISCHARGE PLAN:  The patient was encouraged to followup with her primary  physician for further assessment and management regarding her acute  renal failure, obesity, asthma and the rest of the medical problems.      Beckey Rutter, MD  Electronically Signed     EME/MEDQ  D:  09/28/2006  T:  09/29/2006  Job:  [id number]

## 2010-08-03 NOTE — Discharge Summary (Signed)
Jacqueline Woodard, AZER NO.:  [id number]   MEDICAL RECORD NO.:  [id number]          PATIENT TYPE:  INP   LOCATION:  5743                         FACILITY:  [facility]   PHYSICIAN:  Leighton Roach McDiarmid, M.D.DATE OF BIRTH:  05-13-1985   DATE OF ADMISSION:  02/18/2004  DATE OF DISCHARGE:  02/20/2004                                 DISCHARGE SUMMARY   PRIMARY CARE PHYSICIAN:  Dr. Pecola Leisure   DISCHARGE DIAGNOSES:  1.  Gastroenteritis, likely viral.  2.  Bacterial vaginosis.  3.  Pneumococcal vaccine given.   DISCHARGE MEDICATIONS:  1.  Flagyl 500 mg one p.o. t.i.d. beginning on February 25, 2004.  2.  Reglan 5 mg one p.o. before meals.   FOLLOWUP:  February 27, 2004 at [facility] at 11 a.m. with Dr.  Pecola Leisure.   PROCEDURES AND DIAGNOSTIC STUDIES:  1.  CT of the abdomen and pelvis.  CT of the abdomen showing small bowel      obstruction versus ileus with probable tiny liver cysts.  CT pelvis:      Small amount of pelvic free fluid.  No convincing CT evidence of      appendicitis.  2.  Chest x-ray:  Poor inspiration with no definitive active process.  3.  Abdominal plain films showing resolution of previously dilated small      bowel loops.   CONSULTS:  None.   ADMISSION HISTORY AND PHYSICAL:  The patient is a 25 year old African-  American female that began having abdominal pain and nausea and vomiting  approximately 2:30 a.m. on February 18, 2004.  She had multiple episodes of  nausea and vomiting with no hematemesis.  Pain was bilateral lower quadrant.  Denies bloody stools or melena.  Denies sick contacts.  Also reports fever  and chills starting later that day.  No dysuria except for after the in-and-  out catheter in the emergency department.  Patient was noted to have an  increased white count at 14.4 and a temperature of 102.3.  She was admitted  for likely viral gastroenteritis.   HOSPITAL COURSE:  #1 - GASTROENTERITIS, LIKELY VIRAL:  The patient  was  admitted, rehydrated with IV fluids, and treated symptomatically with  antiemetics and antipyretics.  During hospitalization the patient's  abdominal pain decreased and her ability to tolerate p.o. increased.  Notably, initial findings on CT with small bowel obstruction versus ileus  was resolved on plain films and at time of discharge patient was stable and  tolerating p.o. and had follow-up arranged with a primary care Hazelyn Kallen.   #2 - BACTERIAL VAGINOSIS:  Patient had moderate clue cells seen on wet prep.  The patient was given a prescription for metronidazole 500 mg one p.o.  t.i.d. x7 days to begin on February 25, 2004.  The patient is to delay  therapy because of possible increase in GI upset.   #3 - PNEUMONIA VACCINE GIVEN:  The patient was inoculated with the  Pneumococcal vaccine.  Patient had a reaction of local soreness Pneumo  vaccine site on the right upper extremity in the region of the deltoid.  Patient  had no signs of a systemic reaction requiring intervention.  The  patient was stable to discharge home with symptomatic treatment consisting  of Tylenol.   DISCHARGE LABORATORIES:  White count 7.1, hemoglobin 10.4, hematocrit 30.6,  platelet count 253.  Sodium 133, potassium 3.1, chloride 106, CO2 20,  glucose 101, BUN 4, creatinine 0.8, calcium 7.5.  GC and Chlamydia both  negative.  RPR nonreactive.  Influenza nasal swabs negative for ANB.  Wet  prep positive only for clue cells.  Urine pregnancy negative.  Lipase 20.  LFTs within normal limits with the exception of albumin 3.4.  Urinalysis  noted for a cloudy appearance with the presence of blood and a small amount  of leukocytes.       GSD/MEDQ  D:  02/20/2004  T:  02/21/2004  Job:  [id number]   cc:   Jocelyn Lamer D. Pecola Leisure, M.D.  [address]  [place]  [place] [address]  Fax: [phone]

## 2010-08-03 NOTE — H&P (Signed)
NAME:  Jacqueline Woodard, Jacqueline Woodard NO.:  [id number]   MEDICAL RECORD NO.:  [id number]          PATIENT TYPE:  INP   LOCATION:  5735                         FACILITY:  [facility]   PHYSICIAN:  Franchot Mimes, MD      DATE OF BIRTH:  November 07, 1985   DATE OF ADMISSION:  02/18/2004  DATE OF DISCHARGE:                                HISTORY & PHYSICAL   CHIEF COMPLAINT:  Abdominal pain, nausea, and vomiting.   HISTORY OF PRESENT ILLNESS:  The patient presented to [facility]  for evaluation of abdominal pain. She states the abdominal pain started  about 2:30 in the morning. She was asleep and then woke up with this pain.  She has had multiple episodes of nausea and vomiting today. The pain  initially was in the bilateral lower quadrant and it is now primarily in the  umbilical region in the left lower quadrant. She has had no hematemesis  today. She denies any recent alcohol or drug abuse. The patient did have  diarrhea times one this morning. She denies bloody stools or melena. She  denies any sick contacts. The patient does report positive fever and chills  today as well as generalized body aches and pains. She does not have any  dysuria or hematuria, however she does state she has had some soreness after  her I&O cath in the emergency room.  She does deny a sore throat. She also  denies any history of sexually transmitted disease or recent unprotected sex  with different partners.   PAST MEDICAL HISTORY:  Asthma.   PAST SURGICAL HISTORY:  Wisdom tooth extraction.   FAMILY HISTORY:  The patient's mother has coronary artery disease. She  states that diabetes runs in her family.   SOCIAL HISTORY:  The patient states she is sexually active with one partner  only. She has no history of sexually transmitted diseases. She denies  tobacco, alcohol, or drug use.   REVIEW OF SYSTEMS:  As above.   MEDICATIONS:  Albuterol MDI as needed for asthma.   ALLERGIES:  NKDA.   PHYSICAL EXAMINATION:  VITAL SIGNS: Orthostatics reveal the patient is  nonorthostatic with actual decrease in heart rate with standing. Her other  vital signs include a temperature that was initially 99, but did raise to  102.3 in the emergency room, heart rate 85 to 119, respirations 20 to 22,  blood pressure 99 to 109 over 55 to 81, saturation 100% on room air.  GENERAL: Awake, alert, and oriented times three, in no apparent distress.  Affect is appropriate, however the patient does appear ill.  HEENT: Dry mucous membranes. No erythema, exudates, or pus.  NECK: No thyromegaly. Supple. No lymphadenopathy.  CHEST: Clear to auscultation bilaterally with no wheezing.  CARDIOVASCULAR: Regular rate.  Normal S1 and S2 with no murmur.  ABDOMEN: Diffusely tender to palpation. Hyperactive bowel sounds. The  abdomen is full, but not tense. There is no rebound or guarding and this is  a nonsurgical abdomen at this point.  EXTREMITIES: No edema. The extremities are warm times four with normal  capillary refill.  SKIN: No rash.  NEUROLOGIC: Cranial nerves II-XII intact to detailed exam. Strength and  sensation are grossly intact.   LABORATORY DATA:  White blood cell count 14.4, hemoglobin 13.1, platelet  count 328,000, neutrophils 91%, ANC 13.2. CMET is within normal limits.  Lipase is 20. Urinary pregnancy test is negative. UA reveals specific  gravity greater than 1.03, ketones 15, negative blood, small LE. Wet prep  reveals positive clue cells and nothing else. GC and chlamydia test are  pending.   Abdominal CT is read as small bowel obstruction versus ileus. There is a  small amount of free fluid in the pelvis, but no inflammatory changes and no  indication of appendicitis.   ASSESSMENT/PLAN:  A 25 year old female with abdominal pain, nausea, and  vomiting likely due to a viral process.  1.  Abdominal pain. The presumptive diagnosis is a viral gastroenteritis,      however due to the  elevated absolute neutrophil count and the high fever      a bacterial process is possible. A urinary source is not likely given      the results of urinalysis. This does not appear to be due to pancreas,      gallbladder, or a GYN abscess given the results of the CT. I will get a      PA and lateral chest x-ray to check for pneumonia given her asthma      history. However, I will hold on blood cultures, antibiotics at this      point. Will also hold off on stool studies unless she has a bloody bowel      movement. I will check for influenza. Will hydrate with normal saline      times two liters and then at 150 cc an hour. Attempt to control nausea      and vomiting with Phenergan and Zofran. Will give Tylenol for fever and      Motrin for general body aches and pains, and then will re-evaluate in      the morning.  2.  Vaginosis. Will give Flagyl starting in the a.m. I discussed this with      the patient.       TV/MEDQ  D:  02/18/2004  T:  02/18/2004  Job:  [id number]

## 2010-10-11 ENCOUNTER — Emergency Department (HOSPITAL_COMMUNITY)
Admission: EM | Admit: 2010-10-11 | Discharge: 2010-10-11 | Disposition: A | Discharge status: Home / Self Care | Payer: BC Managed Care – PPO | Attending: Emergency Medicine | Admitting: Emergency Medicine

## 2010-10-11 ENCOUNTER — Emergency Department (HOSPITAL_COMMUNITY): Payer: BC Managed Care – PPO

## 2010-10-11 DIAGNOSIS — M545 Low back pain, unspecified: Secondary | ICD-10-CM | POA: Insufficient documentation

## 2010-10-11 DIAGNOSIS — J45909 Unspecified asthma, uncomplicated: Secondary | ICD-10-CM | POA: Insufficient documentation

## 2010-10-11 DIAGNOSIS — M25569 Pain in unspecified knee: Secondary | ICD-10-CM | POA: Insufficient documentation

## 2010-10-11 DIAGNOSIS — T07XXXA Unspecified multiple injuries, initial encounter: Secondary | ICD-10-CM | POA: Insufficient documentation

## 2010-10-11 DIAGNOSIS — S139XXA Sprain of joints and ligaments of unspecified parts of neck, initial encounter: Secondary | ICD-10-CM | POA: Insufficient documentation

## 2010-10-11 DIAGNOSIS — M79609 Pain in unspecified limb: Secondary | ICD-10-CM | POA: Insufficient documentation

## 2010-10-11 DIAGNOSIS — S335XXA Sprain of ligaments of lumbar spine, initial encounter: Secondary | ICD-10-CM | POA: Insufficient documentation

## 2010-10-11 DIAGNOSIS — M542 Cervicalgia: Secondary | ICD-10-CM | POA: Insufficient documentation

## 2010-10-14 ENCOUNTER — Encounter: Payer: Self-pay | Admitting: *Deleted

## 2010-10-14 ENCOUNTER — Emergency Department (HOSPITAL_BASED_OUTPATIENT_CLINIC_OR_DEPARTMENT_OTHER)
Admission: EM | Admit: 2010-10-14 | Discharge: 2010-10-15 | Disposition: A | Discharge status: Home / Self Care | Payer: BC Managed Care – PPO | Attending: Emergency Medicine | Admitting: Emergency Medicine

## 2010-10-14 DIAGNOSIS — R112 Nausea with vomiting, unspecified: Secondary | ICD-10-CM | POA: Insufficient documentation

## 2010-10-14 DIAGNOSIS — E860 Dehydration: Secondary | ICD-10-CM | POA: Insufficient documentation

## 2010-10-14 LAB — URINALYSIS, ROUTINE W REFLEX MICROSCOPIC
Ketones, ur: NEGATIVE mg/dL
Leukocytes, UA: NEGATIVE
Nitrite: NEGATIVE
Protein, ur: NEGATIVE mg/dL
Urobilinogen, UA: 1 mg/dL (ref 0.0–1.0)

## 2010-10-14 LAB — CBC
MCH: 26.4 pg (ref 26.0–34.0)
MCHC: 32.1 g/dL (ref 30.0–36.0)
Platelets: 268 10*3/uL (ref 150–400)

## 2010-10-14 LAB — PREGNANCY, URINE: Preg Test, Ur: NEGATIVE

## 2010-10-14 MED ORDER — SODIUM CHLORIDE 0.9 % IV SOLN
Freq: Once | INTRAVENOUS | Status: AC
  Administered 2010-10-14: via INTRAVENOUS

## 2010-10-14 MED ORDER — KETOROLAC TROMETHAMINE 30 MG/ML IJ SOLN
30.0000 mg | Freq: Once | INTRAMUSCULAR | Status: AC
  Administered 2010-10-14: 30 mg via INTRAVENOUS
  Filled 2010-10-14: qty 1

## 2010-10-14 MED ORDER — ONDANSETRON HCL 4 MG/2ML IJ SOLN
4.0000 mg | Freq: Once | INTRAMUSCULAR | Status: AC
  Administered 2010-10-14: 4 mg via INTRAVENOUS
  Filled 2010-10-14: qty 2

## 2010-10-14 NOTE — ED Provider Notes (Addendum)
History     Chief Complaint  Patient presents with  . Emesis   HPI Comments: Patient states had run out of cymbalta, insurance does not want to pay for it.  Stopped about one week ago, since then has had nausea, headaches and vomiting.  Has been unable to keep much down.  Patient is a 25 y.o. female presenting with vomiting. The history is provided by the patient.  Emesis  This is a new problem. The current episode started more than 2 days ago. The problem occurs 2 to 4 times per day. The problem has been gradually worsening. The emesis has an appearance of stomach contents. There has been no fever. Associated symptoms include headaches. Pertinent negatives include no chills, no diarrhea, no fever and no sweats.    Past Medical History  Diagnosis Date  . Migraine     History reviewed. No pertinent past surgical history.  No family history on file.  History  Substance Use Topics  . Smoking status: Never Smoker   . Smokeless tobacco: Not on file  . Alcohol Use: No    OB History    Grav Para Term Preterm Abortions TAB SAB Ect Mult Living                  Review of Systems  Constitutional: Negative for fever and chills.  Gastrointestinal: Positive for nausea and vomiting. Negative for diarrhea.  Neurological: Positive for headaches.  All other systems reviewed and are negative.    Physical Exam  BP 109/79  Pulse 66  Temp(Src) 97.9 F (36.6 C) (Oral)  Resp 20  SpO2 100%  LMP 08/31/2010  Physical Exam  Constitutional: She is oriented to person, place, and time. She appears well-developed and well-nourished. No distress.  HENT:  Head: Normocephalic and atraumatic.  Mouth/Throat: Oropharynx is clear and moist.  Eyes: Pupils are equal, round, and reactive to light.  Neck: Normal range of motion. Neck supple.  Cardiovascular: Normal rate and regular rhythm.  Exam reveals no gallop and no friction rub.   No murmur heard. Pulmonary/Chest: Effort normal and breath  sounds normal. No respiratory distress.  Abdominal: Soft. Bowel sounds are normal. There is no tenderness.  Musculoskeletal: Normal range of motion.  Neurological: She is alert and oriented to person, place, and time.  Skin: Skin is warm and dry. She is not diaphoretic.  Psychiatric: She has a normal mood and affect.    ED Course  Procedures  MDM Labs okay.  I suspect that patient's symptoms are related to the abrupt cessation of SSRI therapy.  She should discuss with her doctor whether to continue this or not.        Geoffery Lyons, MD 10/15/10 0454  Geoffery Lyons, MD 10/28/10 2055

## 2010-10-14 NOTE — ED Notes (Signed)
Vomiting x 1 week. Headache. Hx of migraines but this headache does not feel like a migraine.

## 2010-10-15 LAB — COMPREHENSIVE METABOLIC PANEL
ALT: <5 U/L (ref 0–35)
AST: 16 U/L (ref 0–37)
Calcium: 9.2 mg/dL (ref 8.4–10.5)
Creatinine, Ser: 0.7 mg/dL (ref 0.50–1.10)
GFR calc Af Amer: >60 mL/min (ref >60)
Sodium: 139 mEq/L (ref 135–145)
Total Protein: 7.1 g/dL (ref 6.0–8.3)

## 2010-10-15 MED ORDER — PROMETHAZINE HCL 25 MG PO TABS: 25.0000 mg | ORAL_TABLET | Freq: Four times a day (QID) | ORAL | Status: DC | PRN

## 2010-10-15 MED ORDER — ONDANSETRON 8 MG PO TBDP
ORAL_TABLET | ORAL | Status: AC
  Administered 2010-10-15: 8 mg
  Filled 2010-10-15: qty 1

## 2010-11-26 ENCOUNTER — Other Ambulatory Visit: Payer: Self-pay | Admitting: Gastroenterology

## 2010-11-26 DIAGNOSIS — R112 Nausea with vomiting, unspecified: Secondary | ICD-10-CM

## 2010-11-26 DIAGNOSIS — R1013 Epigastric pain: Secondary | ICD-10-CM

## 2010-11-28 ENCOUNTER — Ambulatory Visit
Admission: RE | Admit: 2010-11-28 | Discharge: 2010-11-28 | Disposition: A | Discharge status: Home / Self Care | Payer: BC Managed Care – PPO | Source: Ambulatory Visit | Attending: Gastroenterology | Admitting: Gastroenterology

## 2010-11-28 DIAGNOSIS — R112 Nausea with vomiting, unspecified: Secondary | ICD-10-CM

## 2010-11-28 DIAGNOSIS — R1013 Epigastric pain: Secondary | ICD-10-CM

## 2010-12-26 DIAGNOSIS — K5900 Constipation, unspecified: Secondary | ICD-10-CM | POA: Insufficient documentation

## 2010-12-26 DIAGNOSIS — R112 Nausea with vomiting, unspecified: Secondary | ICD-10-CM | POA: Insufficient documentation

## 2010-12-27 ENCOUNTER — Emergency Department (HOSPITAL_BASED_OUTPATIENT_CLINIC_OR_DEPARTMENT_OTHER)
Admission: EM | Admit: 2010-12-27 | Discharge: 2010-12-27 | Disposition: A | Discharge status: Home / Self Care | Payer: BC Managed Care – PPO | Attending: Emergency Medicine | Admitting: Emergency Medicine

## 2010-12-27 ENCOUNTER — Encounter (HOSPITAL_BASED_OUTPATIENT_CLINIC_OR_DEPARTMENT_OTHER): Payer: Self-pay | Admitting: *Deleted

## 2010-12-27 DIAGNOSIS — R112 Nausea with vomiting, unspecified: Secondary | ICD-10-CM

## 2010-12-27 LAB — URINALYSIS, ROUTINE W REFLEX MICROSCOPIC
Bilirubin Urine: NEGATIVE
Hgb urine dipstick: NEGATIVE
Nitrite: NEGATIVE
Specific Gravity, Urine: 1.029 (ref 1.005–1.030)
pH: 6 (ref 5.0–8.0)

## 2010-12-27 LAB — URINE MICROSCOPIC-ADD ON

## 2010-12-27 LAB — LIPASE, BLOOD: Lipase: 17 U/L (ref 11–59)

## 2010-12-27 LAB — COMPREHENSIVE METABOLIC PANEL
AST: 22 U/L (ref 0–37)
Albumin: 4 g/dL (ref 3.5–5.2)
BUN: 10 mg/dL (ref 6–23)
Calcium: 9.3 mg/dL (ref 8.4–10.5)
Creatinine, Ser: 0.7 mg/dL (ref 0.50–1.10)
Total Bilirubin: 0.5 mg/dL (ref 0.3–1.2)
Total Protein: 8.1 g/dL (ref 6.0–8.3)

## 2010-12-27 LAB — DIFFERENTIAL
Basophils Absolute: 0 10*3/uL (ref 0.0–0.1)
Basophils Relative: 0 % (ref 0–1)
Eosinophils Absolute: 0 10*3/uL (ref 0.0–0.7)
Eosinophils Relative: 1 % (ref 0–5)
Monocytes Absolute: 0.6 10*3/uL (ref 0.1–1.0)
Monocytes Relative: 6 % (ref 3–12)
Neutro Abs: 5.8 10*3/uL (ref 1.7–7.7)

## 2010-12-27 LAB — CBC
HCT: 38.4 % (ref 36.0–46.0)
Hemoglobin: 12.4 g/dL (ref 12.0–15.0)
MCH: 26.3 pg (ref 26.0–34.0)
MCHC: 32.3 g/dL (ref 30.0–36.0)
RDW: 13.3 % (ref 11.5–15.5)

## 2010-12-27 LAB — PREGNANCY, URINE: Preg Test, Ur: NEGATIVE

## 2010-12-27 MED ORDER — ONDANSETRON 8 MG PO TBDP
8.0000 mg | ORAL_TABLET | Freq: Once | ORAL | Status: AC
  Administered 2010-12-27: 8 mg via ORAL

## 2010-12-27 MED ORDER — ONDANSETRON 8 MG PO TBDP: 8.0000 mg | ORAL_TABLET | Freq: Three times a day (TID) | ORAL | Status: AC | PRN

## 2010-12-27 MED ORDER — SODIUM CHLORIDE 0.9 % IV BOLUS (SEPSIS)
1000.0000 mL | Freq: Once | INTRAVENOUS | Status: AC
  Administered 2010-12-27: 1000 mL via INTRAVENOUS

## 2010-12-27 MED ORDER — POLYETHYLENE GLYCOL 3350 17 GM/SCOOP PO POWD: 17.0000 g | Freq: Every day | ORAL | Status: AC

## 2010-12-27 MED ORDER — ONDANSETRON HCL 8 MG PO TABS
8.0000 mg | ORAL_TABLET | Freq: Once | ORAL | Status: DC
  Filled 2010-12-27: qty 1

## 2010-12-27 MED ORDER — ONDANSETRON 8 MG PO TBDP
ORAL_TABLET | ORAL | Status: AC
  Filled 2010-12-27: qty 1

## 2010-12-27 MED ORDER — ONDANSETRON HCL 4 MG/2ML IJ SOLN
4.0000 mg | Freq: Once | INTRAMUSCULAR | Status: AC
  Administered 2010-12-27: 4 mg via INTRAVENOUS
  Filled 2010-12-27: qty 2

## 2010-12-27 NOTE — ED Provider Notes (Signed)
History     CSN: [id number] Arrival date & time: 12/27/2010 12:14 AM  Chief Complaint  Patient presents with  . Nausea    HPI Jacqueline A Johnsonis a 25 y.o. female who presents today with chief complaint of nausea and vomiting. Patient has been seen for this several days ago. She had only had nausea until today. She had one episode of green emesis. There was no blood in this. Patient said the episode was severe and lasted 15 minutes. She did feel better afterwards. Previous evaluation for patient's nausea she was having upper abdominal pain. At that time she had an ultrasound that was negative. Today patient complains of very mild left lower quadrant pain that she rates as a 2/10. Nothing has seemed to make this better or worse. She did note that she has not had a bowel movement in 2 days. She has no history of abdominal surgeries. Patient said that she is more concerned about her nausea that she is about the constipation. She has no known sick contacts. Patient has continued to drink liquids but only a limited amount.  She denies any fevers. There are no other associated or modifying factors. Past Medical History  Diagnosis Date  . Migraine   . Asthma     History reviewed. No pertinent past surgical history.  History reviewed. No pertinent family history.  History  Substance Use Topics  . Smoking status: Never Smoker   . Smokeless tobacco: Not on file  . Alcohol Use: No    OB History    Grav Para Term Preterm Abortions TAB SAB Ect Mult Living                  Review of Systems  Constitutional: Negative.   HENT: Negative.   Eyes: Negative.   Respiratory: Negative.   Cardiovascular: Negative.   Gastrointestinal: Positive for nausea, vomiting, abdominal pain and constipation.  Genitourinary: Negative.   Musculoskeletal: Negative.   Neurological: Negative.   Hematological: Negative.   Psychiatric/Behavioral: Negative.   All other systems reviewed and are  negative.    Allergies  Shellfish allergy  Home Medications   Current Outpatient Rx  Name Route Sig Dispense Refill  . VENLAFAXINE HCL 100 MG PO TABS Oral Take 100 mg by mouth 2 (two) times daily.      . ALBUTEROL 90 MCG/ACT IN AERS Inhalation Inhale 2 puffs into the lungs once as needed. For asthma     . DULOXETINE HCL 60 MG PO CPEP Oral Take 60 mg by mouth daily.      . CYMBALTA PO Oral Take by mouth.      Marland Kitchen HYDROCODONE-ACETAMINOPHEN 5-325 MG PO TABS Oral Take 1-2 tablets by mouth every 4 (four) hours as needed. For pain     . ONDANSETRON 8 MG PO TBDP Oral Take 1 tablet (8 mg total) by mouth every 8 (eight) hours as needed for nausea. 20 tablet 0  . POLYETHYLENE GLYCOL 3350 PO POWD Oral Take 17 g by mouth daily. 255 g 0    BP 143/75  Pulse 87  Temp(Src) 97.7 F (36.5 C) (Oral)  Resp 18  Ht 5\' 1"  (1.549 m)  Wt 193 lb (87.544 kg)  BMI 36.47 kg/m2  SpO2 100%  LMP 11/29/2010  Physical Exam  Nursing note and vitals reviewed. Constitutional: She is oriented to person, place, and time. She appears well-developed and well-nourished. No distress.  HENT:  Head: Normocephalic and atraumatic.  Eyes: Conjunctivae and EOM are normal. Pupils are  equal, round, and reactive to light.  Neck: Normal range of motion.  Cardiovascular: Normal rate and regular rhythm.  Exam reveals no gallop and no friction rub.   No murmur heard. Pulmonary/Chest: Effort normal and breath sounds normal. No respiratory distress. She has no wheezes. She has no rales.  Abdominal: Soft. Bowel sounds are normal. She exhibits no distension. There is tenderness. There is no rebound and no guarding.       Patient with minimal left lower quadrant tenderness on deep palpation  Musculoskeletal: Normal range of motion. She exhibits no edema and no tenderness.  Neurological: She is alert and oriented to person, place, and time. No cranial nerve deficit. She exhibits normal muscle tone. Coordination normal.  Skin: Skin is  warm and dry. No rash noted.  Psychiatric: She has a normal mood and affect.    ED Course  Procedures  Labs Reviewed  URINALYSIS, ROUTINE W REFLEX MICROSCOPIC - Abnormal; Notable for the following:    Appearance TURBID (*)    Ketones, ur 15 (*)    Leukocytes, UA TRACE (*)    All other components within normal limits  URINE MICROSCOPIC-ADD ON - Abnormal; Notable for the following:    Squamous Epithelial / LPF MANY (*)    Bacteria, UA MANY (*)    All other components within normal limits  COMPREHENSIVE METABOLIC PANEL - Abnormal; Notable for the following:    Potassium 5.2 (*) HEMOLYZED SPECIMEN, RESULTS MAY BE AFFECTED   Alkaline Phosphatase 131 (*)    All other components within normal limits  PREGNANCY, URINE  CBC  DIFFERENTIAL  LIPASE, BLOOD   No results found.   1. Nausea & vomiting   2. Constipation       MDM  Patient was evaluated by myself. She had a contaminated urine that did show ketones but was not concerning for urinary tract infection. Pregnancy test was also negative. The patient was given Zofran oral dissolving tablet prior to my arrival. She was not actively vomiting. Patient received 1 L of normal saline IV bolus for me. She did have laboratory workup for abdominal pain which was remarkable only for a K. of 5.2 which was due to a hemolyzed specimen as well as normal lipase and liver function as well as a normal CBC. Patient was feeling much better following therapy. We did discuss that as she had had a slight cough and pneumonia was a consideration. Patient did not desire chest x-ray today as she thought this is less likely. Also we discussed constipation and if the patient to start taking MiraLAX on a daily basis. She did not have signs of obstruction today. Patient had been able to tolerate oral intake with no vomiting while here in the ED. She was comfortable with plan for discharge home with a prescription for Zofran as well as MiraLAX. Patient was given  precautions for reasons to return was discharged home in good condition.        Cyndra Numbers, MD 12/27/10 [phone]

## 2010-12-27 NOTE — ED Notes (Signed)
C/o nausea, chills, and no BM in 2 days

## 2011-01-01 LAB — RENAL FUNCTION PANEL
CO2: 21
Chloride: 106
GFR calc Af Amer: 10 — ABNORMAL LOW
GFR calc non Af Amer: 9 — ABNORMAL LOW
Glucose, Bld: 117 — ABNORMAL HIGH
Sodium: 133 — ABNORMAL LOW

## 2011-01-01 LAB — CBC
HCT: 26.4 — ABNORMAL LOW
HCT: 26.5 — ABNORMAL LOW
HCT: 27.4 — ABNORMAL LOW
HCT: 28.6 — ABNORMAL LOW
HCT: 30.1 — ABNORMAL LOW
HCT: 31.8 — ABNORMAL LOW
HCT: 34 — ABNORMAL LOW
Hemoglobin: 10.5 — ABNORMAL LOW
Hemoglobin: 10.7 — ABNORMAL LOW
Hemoglobin: 11.1 — ABNORMAL LOW
Hemoglobin: 11.3 — ABNORMAL LOW
Hemoglobin: 11.8 — ABNORMAL LOW
Hemoglobin: 8.6 — ABNORMAL LOW
MCHC: 32.5
MCHC: 32.8
MCHC: 32.9
MCV: 79.4
MCV: 80.6
MCV: 81.5
Platelets: 246
Platelets: 332
Platelets: 341
Platelets: 370
Platelets: 375
RBC: 3.24 — ABNORMAL LOW
RBC: 3.92
RBC: 4.18
RBC: 4.26
RBC: 4.44
RDW: 13.6
RDW: 13.7
RDW: 13.9
RDW: 14.2 — ABNORMAL HIGH
RDW: 14.2 — ABNORMAL HIGH
RDW: 14.3 — ABNORMAL HIGH
RDW: 14.4 — ABNORMAL HIGH
WBC: 12.6 — ABNORMAL HIGH
WBC: 12.7 — ABNORMAL HIGH
WBC: 13.8 — ABNORMAL HIGH
WBC: 13.8 — ABNORMAL HIGH
WBC: 14.4 — ABNORMAL HIGH
WBC: 19.3 — ABNORMAL HIGH
WBC: 9.8

## 2011-01-01 LAB — OCCULT BLOOD X 1 CARD TO LAB, STOOL
Fecal Occult Bld: NEGATIVE
Fecal Occult Bld: NEGATIVE

## 2011-01-01 LAB — URINALYSIS, ROUTINE W REFLEX MICROSCOPIC
Bilirubin Urine: NEGATIVE
Glucose, UA: NEGATIVE
Ketones, ur: NEGATIVE
Leukocytes, UA: NEGATIVE
Nitrite: NEGATIVE
Protein, ur: NEGATIVE
Protein, ur: NEGATIVE
Urobilinogen, UA: 0.2
Urobilinogen, UA: 1

## 2011-01-01 LAB — RETICULOCYTES: Retic Count, Absolute: 46.4

## 2011-01-01 LAB — BASIC METABOLIC PANEL
BUN: 16
BUN: 18
BUN: 24 — ABNORMAL HIGH
BUN: 29 — ABNORMAL HIGH
BUN: 29 — ABNORMAL HIGH
BUN: 35 — ABNORMAL HIGH
CO2: 17 — ABNORMAL LOW
CO2: 21
Calcium: 7.4 — ABNORMAL LOW
Calcium: 7.5 — ABNORMAL LOW
Calcium: 8 — ABNORMAL LOW
Calcium: 8 — ABNORMAL LOW
Calcium: 8.3 — ABNORMAL LOW
Chloride: 100
Chloride: 103
Chloride: 104
Chloride: 109
Chloride: 111
Creatinine, Ser: 0.82
Creatinine, Ser: 3.18 — ABNORMAL HIGH
Creatinine, Ser: 5.82 — ABNORMAL HIGH
GFR calc Af Amer: 11 — ABNORMAL LOW
GFR calc Af Amer: 11 — ABNORMAL LOW
GFR calc Af Amer: 26 — ABNORMAL LOW
GFR calc Af Amer: >60
GFR calc non Af Amer: 10 — ABNORMAL LOW
GFR calc non Af Amer: 12 — ABNORMAL LOW
GFR calc non Af Amer: 15 — ABNORMAL LOW
GFR calc non Af Amer: 18 — ABNORMAL LOW
GFR calc non Af Amer: 21 — ABNORMAL LOW
GFR calc non Af Amer: 9 — ABNORMAL LOW
GFR calc non Af Amer: 9 — ABNORMAL LOW
GFR calc non Af Amer: 9 — ABNORMAL LOW
Glucose, Bld: 112 — ABNORMAL HIGH
Glucose, Bld: 69 — ABNORMAL LOW
Glucose, Bld: 77
Glucose, Bld: 83
Glucose, Bld: 93
Glucose, Bld: 99
Potassium: 3.2 — ABNORMAL LOW
Potassium: 3.4 — ABNORMAL LOW
Potassium: 3.9
Potassium: 3.9
Potassium: 4
Potassium: 4.3
Potassium: 5.1
Sodium: 132 — ABNORMAL LOW
Sodium: 135
Sodium: 138
Sodium: 138
Sodium: 138

## 2011-01-01 LAB — HEMOGLOBIN A1C: Hgb A1c MFr Bld: 5.6

## 2011-01-01 LAB — CLOSTRIDIUM DIFFICILE EIA
C difficile Toxins A+B, EIA: NEGATIVE
C difficile Toxins A+B, EIA: NEGATIVE
C difficile Toxins A+B, EIA: NEGATIVE

## 2011-01-01 LAB — FECAL LACTOFERRIN, QUANT: Fecal Lactoferrin: NEGATIVE

## 2011-01-01 LAB — DIFFERENTIAL
Basophils Absolute: 0
Basophils Relative: 0
Lymphocytes Relative: 22
Monocytes Absolute: 1.1 — ABNORMAL HIGH
Neutro Abs: 6.4
Neutrophils Relative %: 65

## 2011-01-01 LAB — URINE CULTURE: Colony Count: >=100000

## 2011-01-01 LAB — FOLATE: Folate: 4.6

## 2011-01-01 LAB — SODIUM, URINE, RANDOM: Sodium, Ur: 67

## 2011-01-01 LAB — IRON AND TIBC: Iron: <10 — ABNORMAL LOW

## 2011-01-01 LAB — TSH: TSH: 3.073

## 2011-01-01 LAB — CREATININE, URINE, RANDOM: Creatinine, Urine: 86.1

## 2011-01-01 LAB — STOOL CULTURE

## 2011-01-01 LAB — CULTURE, BLOOD (ROUTINE X 2): Culture: NO GROWTH

## 2011-01-02 LAB — COMPREHENSIVE METABOLIC PANEL
ALT: 8
AST: 20
CO2: 23
Chloride: 102
GFR calc Af Amer: >60
GFR calc non Af Amer: >60
Potassium: 3.1 — ABNORMAL LOW
Sodium: 133 — ABNORMAL LOW
Total Bilirubin: 1

## 2011-01-02 LAB — URINE CULTURE: Colony Count: >=100000

## 2011-01-02 LAB — URINALYSIS, ROUTINE W REFLEX MICROSCOPIC
Nitrite: NEGATIVE
Protein, ur: 30 — AB
Specific Gravity, Urine: 1.042 — ABNORMAL HIGH
Urobilinogen, UA: 0.2

## 2011-01-02 LAB — DIFFERENTIAL
Basophils Absolute: 0
Eosinophils Absolute: 0
Eosinophils Relative: 0

## 2011-01-02 LAB — URINE MICROSCOPIC-ADD ON

## 2011-01-02 LAB — CULTURE, BLOOD (ROUTINE X 2)
Culture: NO GROWTH
Culture: NO GROWTH

## 2011-01-02 LAB — CBC
RBC: 4.43
WBC: 19.1 — ABNORMAL HIGH

## 2011-01-02 LAB — SEDIMENTATION RATE: Sed Rate: 21

## 2011-01-11 ENCOUNTER — Other Ambulatory Visit: Payer: Self-pay | Admitting: Gastroenterology

## 2011-01-23 ENCOUNTER — Other Ambulatory Visit (HOSPITAL_COMMUNITY): Payer: Self-pay | Admitting: Gastroenterology

## 2011-01-23 DIAGNOSIS — R1013 Epigastric pain: Secondary | ICD-10-CM

## 2011-02-15 ENCOUNTER — Encounter (HOSPITAL_COMMUNITY)
Admission: RE | Admit: 2011-02-15 | Discharge: 2011-02-15 | Disposition: A | Discharge status: Home / Self Care | Payer: BC Managed Care – PPO | Source: Ambulatory Visit | Attending: Gastroenterology | Admitting: Gastroenterology

## 2011-02-15 DIAGNOSIS — R112 Nausea with vomiting, unspecified: Secondary | ICD-10-CM | POA: Insufficient documentation

## 2011-02-15 DIAGNOSIS — R1013 Epigastric pain: Secondary | ICD-10-CM | POA: Insufficient documentation

## 2011-02-15 DIAGNOSIS — E119 Type 2 diabetes mellitus without complications: Secondary | ICD-10-CM | POA: Insufficient documentation

## 2011-02-15 MED ORDER — TECHNETIUM TC 99M MEBROFENIN IV KIT
5.2000 | PACK | Freq: Once | INTRAVENOUS | Status: AC | PRN
  Administered 2011-02-15: 5 via INTRAVENOUS

## 2011-02-15 MED ORDER — SINCALIDE 5 MCG IJ SOLR: 0.0200 ug/kg | Freq: Once | INTRAMUSCULAR | Status: DC

## 2011-07-12 ENCOUNTER — Ambulatory Visit
Admission: RE | Admit: 2011-07-12 | Discharge: 2011-07-12 | Disposition: A | Discharge status: Home / Self Care | Payer: BC Managed Care – PPO | Source: Ambulatory Visit | Attending: Internal Medicine | Admitting: Internal Medicine

## 2011-07-12 ENCOUNTER — Other Ambulatory Visit: Payer: Self-pay | Admitting: Internal Medicine

## 2011-07-12 DIAGNOSIS — M79606 Pain in leg, unspecified: Secondary | ICD-10-CM

## 2011-09-03 ENCOUNTER — Ambulatory Visit (INDEPENDENT_AMBULATORY_CARE_PROVIDER_SITE_OTHER): Payer: BC Managed Care – PPO | Admitting: Emergency Medicine

## 2011-09-03 VITALS — BP 106/72 | HR 81 | Temp 98.1°F | Resp 18 | Ht 62.0 in | Wt 206.8 lb

## 2011-09-03 DIAGNOSIS — I890 Lymphedema, not elsewhere classified: Secondary | ICD-10-CM

## 2011-09-03 NOTE — Progress Notes (Signed)
   Patient Name: Jacqueline Woodard Date of Birth: 1986-01-08 Medical Record Number: [id number] Gender: female Date of Encounter: 09/03/2011  History of Present Illness:  Jacqueline Woodard is a 26 y.o. very pleasant female patient who presents with the following:  History of lymphedema since age 36 and has suddenly worsened over past month and become so severe in left leg that she cannot put her toes on the ground due to swelling in her foot.  Denies risk for DVT  Patient Active Problem List  Diagnosis  . MIGRAINE HEADACHE  . CARPAL TUNNEL SYNDROME, BILATERAL  . ASTHMA  . CELLULITIS  . HX, PERSONAL, EXPOSURE TO LEAD   Past Medical History  Diagnosis Date  . Migraine   . Asthma    No past surgical history on file. History  Substance Use Topics  . Smoking status: Never Smoker   . Smokeless tobacco: Never Used  . Alcohol Use: Yes     social   No family history on file. Allergies  Allergen Reactions  . Shellfish Allergy Itching and Swelling    Medication list has been reviewed and updated.  Prior to Admission medications   Medication Sig Start Date End Date Taking? Authorizing Provider  albuterol (PROVENTIL,VENTOLIN) 90 MCG/ACT inhaler Inhale 2 puffs into the lungs once as needed. For asthma    Yes Historical Provider, MD  esomeprazole (NEXIUM) 40 MG capsule Take 40 mg by mouth daily before breakfast.   Yes Historical Provider, MD  promethazine (PHENERGAN) 25 MG tablet Take 25 mg by mouth every 6 (six) hours as needed.   Yes Historical Provider, MD  DULoxetine (CYMBALTA) 60 MG capsule Take 60 mg by mouth daily.      Historical Provider, MD  DULoxetine HCl (CYMBALTA PO) Take by mouth.      Historical Provider, MD  HYDROcodone-acetaminophen (NORCO) 5-325 MG per tablet Take 1-2 tablets by mouth every 4 (four) hours as needed. For pain     Historical Provider, MD  promethazine (PHENERGAN) 25 MG tablet Take 1 tablet (25 mg total) by mouth every 6 (six) hours as needed for  nausea. 10/15/10 10/22/10  Geoffery Lyons, MD  venlafaxine (EFFEXOR) 100 MG tablet Take 100 mg by mouth 2 (two) times daily.      Historical Provider, MD    Review of Systems:  As per HPI, otherwise negative.    Physical Examination: Filed Vitals:   09/03/11 1901  BP: 106/72  Pulse: 81  Temp: 98.1 F (36.7 C)  Resp: 18   Filed Vitals:   09/03/11 1901  Height: 5\' 2"  (1.575 m)  Weight: 206 lb 12.8 oz (93.804 kg)   Body mass index is 37.82 kg/(m^2). Ideal Body Weight: Weight in (lb) to have BMI = 25: 136.4   GEN: WDWN, NAD, Non-toxic, A & O x 3 HEENT: Atraumatic, Normocephalic. Neck supple. No masses, No LAD. Ears and Nose: No external deformity. CV: RRR, No M/G/R. No JVD. No thrill. No extra heart sounds. PULM: CTA B, no wheezes, crackles, rhonchi. No retractions. No resp. distress. No accessory muscle use. ABD: S, NT, ND, +BS. No rebound. No HSM. EXTR: No c/c/ marked edema both lower extremities NEURO Normal gait.  PSYCH: Normally interactive. Conversant. Not depressed or anxious appearing.  Calm demeanor.   Assessment and Plan: Lymphedema  R/o DVT Refer vascular   Carmelina Dane, MD

## 2011-09-04 ENCOUNTER — Ambulatory Visit
Admission: RE | Admit: 2011-09-04 | Discharge: 2011-09-04 | Disposition: A | Discharge status: Home / Self Care | Payer: BC Managed Care – PPO | Source: Ambulatory Visit | Attending: Emergency Medicine | Admitting: Emergency Medicine

## 2011-09-04 DIAGNOSIS — I890 Lymphedema, not elsewhere classified: Secondary | ICD-10-CM

## 2011-09-05 ENCOUNTER — Other Ambulatory Visit: Payer: Self-pay

## 2011-09-05 DIAGNOSIS — R600 Localized edema: Secondary | ICD-10-CM

## 2011-10-18 ENCOUNTER — Encounter: Payer: Self-pay | Admitting: Surgery

## 2011-10-21 ENCOUNTER — Ambulatory Visit (INDEPENDENT_AMBULATORY_CARE_PROVIDER_SITE_OTHER): Payer: BC Managed Care – PPO | Admitting: Surgery

## 2011-10-21 ENCOUNTER — Encounter: Payer: Self-pay | Admitting: Surgery

## 2011-10-21 ENCOUNTER — Encounter (INDEPENDENT_AMBULATORY_CARE_PROVIDER_SITE_OTHER): Payer: BC Managed Care – PPO | Admitting: *Deleted

## 2011-10-21 VITALS — BP 134/89 | HR 71 | Resp 16 | Ht 62.0 in | Wt 204.0 lb

## 2011-10-21 DIAGNOSIS — R600 Localized edema: Secondary | ICD-10-CM

## 2011-10-21 DIAGNOSIS — M7989 Other specified soft tissue disorders: Secondary | ICD-10-CM

## 2011-10-21 DIAGNOSIS — M79609 Pain in unspecified limb: Secondary | ICD-10-CM

## 2011-10-21 DIAGNOSIS — M79606 Pain in leg, unspecified: Secondary | ICD-10-CM | POA: Insufficient documentation

## 2011-10-21 DIAGNOSIS — G8929 Other chronic pain: Secondary | ICD-10-CM | POA: Insufficient documentation

## 2011-10-21 DIAGNOSIS — R609 Edema, unspecified: Secondary | ICD-10-CM

## 2011-10-21 NOTE — Progress Notes (Signed)
Vascular and Vein Specialist of    Patient name: Jacqueline Woodard MRN: [id number] DOB: 1985/10/31 Sex: female   Referred by: Urgent care  Reason for referral:  Chief Complaint  Patient presents with  . New Evaluation    bil leg edema/Dr. Phillips Odor - pt c/o pain in legs but mostly right, also numbness and tingling    HISTORY OF PRESENT ILLNESS: The patient comes in today with complaints of bilateral leg swelling, right greater than left. She states that this is been going on for many years but has gotten worse recently. Her symptoms are aggravated by the heat and with prolonged standing. She also describes that her feet begin tingling with significant swelling. She states that she does not have any varicose veins. She has never had a deep vein thrombosis. She has no history of trauma. There is no family history of venous disorders. She has never had children. She does not smoke.  Past Medical History  Diagnosis Date  . Migraine   . Asthma     History reviewed. No pertinent past surgical history.  History   Social History  . Marital Status: Single    Spouse Name: N/A    Number of Children: N/A  . Years of Education: N/A   Occupational History  . Not on file.   Social History Main Topics  . Smoking status: Never Smoker   . Smokeless tobacco: Never Used  . Alcohol Use: Yes     social  . Drug Use: No  . Sexually Active: Not on file   Other Topics Concern  . Not on file   Social History Narrative  . No narrative on file    History reviewed. No pertinent family history.  Allergies as of 10/21/2011 - Review Complete 10/21/2011  Allergen Reaction Noted  . Shellfish allergy Itching and Swelling 10/14/2010    Current Outpatient Prescriptions on File Prior to Visit  Medication Sig Dispense Refill  . esomeprazole (NEXIUM) 40 MG capsule Take 40 mg by mouth daily before breakfast.      . albuterol (PROVENTIL,VENTOLIN) 90 MCG/ACT inhaler Inhale 2 puffs  into the lungs once as needed. For asthma       . DULoxetine (CYMBALTA) 60 MG capsule Take 60 mg by mouth daily.        . DULoxetine HCl (CYMBALTA PO) Take by mouth.        Marland Kitchen HYDROcodone-acetaminophen (NORCO) 5-325 MG per tablet Take 1-2 tablets by mouth every 4 (four) hours as needed. For pain       . promethazine (PHENERGAN) 25 MG tablet Take 1 tablet (25 mg total) by mouth every 6 (six) hours as needed for nausea.  10 tablet  0  . promethazine (PHENERGAN) 25 MG tablet Take 25 mg by mouth every 6 (six) hours as needed.      . venlafaxine (EFFEXOR) 100 MG tablet Take 100 mg by mouth 2 (two) times daily.           REVIEW OF SYSTEMS: Cardiovascular: No chest pain, chest pressure, palpitations, orthopnea, or dyspnea on exertion. No claudication or rest pain,  No history of DVT or phlebitis. Positive for leg swelling Pulmonary: No productive cough,  or wheezing. Positive for asthma Neurologic: No weakness, paresthesias, aphasia, or amaurosis. No dizziness. Hematologic: No bleeding problems or clotting disorders. Musculoskeletal: No joint pain or joint swelling. Gastrointestinal: No blood in stool or hematemesis Genitourinary: No dysuria or hematuria. Psychiatric:: No history of major depression. Integumentary: No rashes or ulcers.  Constitutional: No fever or chills.  PHYSICAL EXAMINATION: General: The patient appears their stated age.  Vital signs are BP 134/89  Pulse 71  Resp 16  Ht 5\' 2"  (1.575 m)  Wt 204 lb (92.534 kg)  BMI 37.31 kg/m2  SpO2 100% HEENT:  No gross abnormalities Pulmonary: Respirations are non-labored Abdomen: Soft and non-tender  Musculoskeletal: There are no major deformities.   Neurologic: No focal weakness or paresthesias are detected, Skin: There are no ulcer or rashes noted. Psychiatric: The patient has normal affect. Cardiovascular: There is a regular rate and rhythm without significant murmur appreciated. 2+ right lower extremity edema, 1-2+ left lower  extremity edema. No skin changes. No ulceration. No varicosities.  Diagnostic Studies: Ultrasound was performed today which reveals right leg reflux at the saphenofemoral junction and in the greater saphenous vein at the knee. There is also deep vein reflux. On the left there is reflux at the saphenofemoral junction.    Assessment:  Bilateral leg swelling Plan: By ultrasound today the patient does have evidence of venous insufficiency. It appears to be in both the deep and superficial system. Based on the size of the saphenous vein I am not sure that she is going to be a good candidate for laser ablation. I am putting her in 20-30 mm compression stockings today. I told her that she'll need to continue to wear these most likely lifelong. I am also going to give her a trial of Lasix to see if this helps. She does not have a primary care physician and so I'm trying to schedule her appointment with a local physician. I will have her come back to see Dr. early in 3 months to confirm whether or not he would consider laser ablation in this setting.     Jorge Ny, M.D. [facility] of [place] Office: [phone] Pager:  [phone]

## 2012-01-20 ENCOUNTER — Ambulatory Visit: Payer: BC Managed Care – PPO | Admitting: Surgery

## 2012-01-31 ENCOUNTER — Encounter: Payer: Self-pay | Admitting: Surgery

## 2012-02-03 ENCOUNTER — Ambulatory Visit: Payer: BC Managed Care – PPO | Admitting: Surgery

## 2012-02-07 ENCOUNTER — Encounter: Payer: Self-pay | Admitting: Surgery

## 2012-02-10 ENCOUNTER — Ambulatory Visit (INDEPENDENT_AMBULATORY_CARE_PROVIDER_SITE_OTHER): Payer: BC Managed Care – PPO | Admitting: Surgery

## 2012-02-10 ENCOUNTER — Encounter: Payer: Self-pay | Admitting: Surgery

## 2012-02-10 VITALS — BP 116/74 | HR 86 | Resp 16 | Ht 62.0 in | Wt 210.0 lb

## 2012-02-10 DIAGNOSIS — M79609 Pain in unspecified limb: Secondary | ICD-10-CM

## 2012-02-10 DIAGNOSIS — R609 Edema, unspecified: Secondary | ICD-10-CM

## 2012-02-11 ENCOUNTER — Ambulatory Visit (INDEPENDENT_AMBULATORY_CARE_PROVIDER_SITE_OTHER): Payer: BC Managed Care – PPO | Admitting: Vascular Surgery

## 2012-02-11 ENCOUNTER — Encounter: Payer: Self-pay | Admitting: Vascular Surgery

## 2012-02-11 VITALS — BP 114/78 | HR 82 | Resp 18 | Ht 61.0 in | Wt 210.0 lb

## 2012-02-11 DIAGNOSIS — I83893 Varicose veins of bilateral lower extremities with other complications: Secondary | ICD-10-CM | POA: Insufficient documentation

## 2012-02-11 DIAGNOSIS — I890 Lymphedema, not elsewhere classified: Secondary | ICD-10-CM | POA: Insufficient documentation

## 2012-02-11 NOTE — Progress Notes (Signed)
Problems with Activities of Daily Living Secondary to Leg Pain  1. Ms. Levier states that her job requires prolonged standing (she is a Production designer, theatre/television/film and works 9 hr. Shifts) and that is very difficult due to leg pain.  2. Ms. Dell states that prolonged sitting when driving long distances is difficult due to leg pain and swelling.     Failure of  Conservative Therapy:  1. Worn 20-30 mm Hg thigh high compression hose >3 months with no relief of symptoms.  2. Frequently elevates legs-no relief of symptoms  3. Taken Ibuprofen 600 Mg TID with no relief of symptoms.  Patient is here today for continued discussion of her right leg swelling. She has worn her thigh high compression. She does not note any improvement in this.  I reimaged her saphenous vein was SonoSite. This is not particularly dilated and I cannot demonstrate any reflux. I did review her prior formal duplex study from August of 2013. This shows some mild reflux at the saphenofemoral junction and common femoral vein. This is bilateral.  On physical exam she does have swelling that extends onto the dorsum of her foot and toes on the right much more so than on the left. This does appear to be more consistent with lymphedema from a clinical standpoint and fluid overload or venous swelling.  I discussed this at length with the patient. I do not see any correctable cause of venous hypertension. This is quite mild at the common femoral vein and saphenofemoral junction. I would not recommend any treatment of this since I do not feel this is causing her problem. I have explained the critical importance of continued compression. I have suggested that she try knee-high compression as a better able to tolerate and thigh which is rolling down on her. We have also referred to lymphedema clinic for further evaluation. She will see [place] on an as-needed basis

## 2012-02-12 ENCOUNTER — Telehealth: Payer: Self-pay | Admitting: Vascular Surgery

## 2012-02-12 NOTE — Telephone Encounter (Signed)
Spoke with Rebecka Apley - pt on wait list for lymphedema specialist - they will notify pt of this - kf

## 2012-02-12 NOTE — Telephone Encounter (Signed)
Spoke with Rebecka Apley - pt on waiting list for Lymphedema specialist - they will contact pt with this info - kf

## 2012-05-01 NOTE — Progress Notes (Signed)
Pt did not show

## 2012-08-19 ENCOUNTER — Ambulatory Visit (INDEPENDENT_AMBULATORY_CARE_PROVIDER_SITE_OTHER): Payer: BC Managed Care – PPO | Admitting: Internal Medicine

## 2012-08-19 ENCOUNTER — Encounter: Payer: Self-pay | Admitting: Internal Medicine

## 2012-08-19 VITALS — BP 138/80 | HR 97 | Ht 62.0 in | Wt 215.4 lb

## 2012-08-19 DIAGNOSIS — R609 Edema, unspecified: Secondary | ICD-10-CM

## 2012-08-19 DIAGNOSIS — L0291 Cutaneous abscess, unspecified: Secondary | ICD-10-CM

## 2012-08-19 DIAGNOSIS — I739 Peripheral vascular disease, unspecified: Secondary | ICD-10-CM

## 2012-08-19 DIAGNOSIS — R600 Localized edema: Secondary | ICD-10-CM

## 2012-08-19 DIAGNOSIS — M79609 Pain in unspecified limb: Secondary | ICD-10-CM

## 2012-08-19 DIAGNOSIS — I83893 Varicose veins of bilateral lower extremities with other complications: Secondary | ICD-10-CM

## 2012-08-19 DIAGNOSIS — I890 Lymphedema, not elsewhere classified: Secondary | ICD-10-CM

## 2012-08-19 NOTE — Patient Instructions (Addendum)
Your physician wants you to follow-up in 2 weeks after test  You will receive a reminder letter in the mail two months in advance. If you don't receive a letter, please call our office to schedule the follow-up appointment.    Your physician has requested that you have a lower  extremity venous duplex. This test is an ultrasound of the veins in the legs  It looks at venous blood flow that carries blood from the heart to the legs  Allow one hour for a Lower Venous exam. Allow thirty minutes for an Upper Venous exam. There are no restrictions or special instructions.

## 2012-08-20 ENCOUNTER — Encounter: Payer: Self-pay | Admitting: Internal Medicine

## 2012-08-20 NOTE — Progress Notes (Signed)
OFFICE NOTE  Chief Complaint:  New patient visit for lower extremity swelling and pain  Primary Care Physician: No primary provider on file.  HPI:  Jacqueline Woodard is a 27 year old female who presents for evaluation of right leg pain. She's been followed by Dr. Fanny Woodard, for problems with flat feet. She's had an issue with the right lower extremity swelling for several months two-year period she was referred to see Dr. early with pain and vascular surgery. She underwent multiple venous studies in the past to rule out DVT due to assymetric swelling. She also underwent a Doppler by Dr. early in the office to look for venous reflux. This is reportedly negative or very mildly positive with reflux at the saphenofemoral junction. His diagnosis was that this is more consistent with lymphedema, without any significant provokable reflux. And that the patient would not benefit from venous closure.  She has been wearing stockings for greater than 3 months with minimal improvement in her swelling. In seeing her today as a second opinion.  PMHx:  Past Medical History  Diagnosis Date  . Migraine   . Asthma     History reviewed. No pertinent past surgical history.  FAMHx:  Family History  Problem Relation Age of Onset  . Thyroid disease Mother   . Other Father     SOCHx:   reports that she has never smoked. She has never used smokeless tobacco. She reports that  drinks alcohol. She reports that she does not use illicit drugs.  ALLERGIES:  Allergies  Allergen Reactions  . Shellfish Allergy Itching and Swelling    Throat and eyes start itching and swelling    ROS: A comprehensive review of systems was negative except for: Constitutional: positive for fatigue Musculoskeletal: positive for Leg pain, itching, heaviness and swelling mostly on the right  HOME MEDS: Current Outpatient Prescriptions  Medication Sig Dispense Refill  . albuterol (PROVENTIL,VENTOLIN) 90 MCG/ACT inhaler Inhale 2 puffs  into the lungs once as needed. For asthma       . esomeprazole (NEXIUM) 40 MG capsule Take 40 mg by mouth daily before breakfast.       No current facility-administered medications for this visit.    LABS/IMAGING: No results found for this or any previous visit (from the past 48 hour(s)). No results found.  VITALS: BP 138/80  Pulse 97  Ht 5\' 2"  (1.575 m)  Wt 215 lb 6.4 oz (97.705 kg)  BMI 39.39 kg/m2  EXAM: Extremities: There are no notable varicose veins, no significant spider veins, and diffuse edema more at the right ankle and lower leg. The left leg demonstrates mild edema and there is a tattoo on the lateral aspect of left leg Pulses: 2+ and symmetric Skin: The skin appears tense and mildly discolored over the right ankle CEAP 3 vascular disease  EKG: deferred  ASSESSMENT: 1. CEAP 3 vascular disease, without clear signs of varicose veins.  2. Possible lymphedema  PLAN: 1.   [name] continues to have swelling despite the stocking wear. She is on her feet for long periods of time during the day. She does not have significant relief when elevating her legs. In addition she has episodes where her leg swells for no good reason but, but then has days where her legs are not very swollen. This does have a description that is more consistent with a process of lymphedema. Nevertheless I would like her to undergo complete venous Dopplers to rule out any significant venous insufficiency. We cannot demonstrate  significant venous insufficiency or dilated veins, then unfortunately there will be few management options other than stocking wire, analgesics, and salt restriction.  We'll see her back in a few weeks to review the results of her ultrasound.  Jacqueline Nose, MD, [facility] Attending Cardiologist [facility]  Jacqueline Woodard C 08/20/2012, 8:37 AM

## 2012-08-28 ENCOUNTER — Ambulatory Visit (HOSPITAL_COMMUNITY)
Admission: RE | Admit: 2012-08-28 | Discharge: 2012-08-28 | Disposition: A | Discharge status: Home / Self Care | Payer: BC Managed Care – PPO | Source: Ambulatory Visit | Attending: Cardiovascular Disease | Admitting: Cardiovascular Disease

## 2012-08-28 DIAGNOSIS — I739 Peripheral vascular disease, unspecified: Secondary | ICD-10-CM

## 2012-08-28 DIAGNOSIS — R609 Edema, unspecified: Secondary | ICD-10-CM

## 2012-08-28 DIAGNOSIS — E669 Obesity, unspecified: Secondary | ICD-10-CM | POA: Insufficient documentation

## 2012-08-28 DIAGNOSIS — M7989 Other specified soft tissue disorders: Secondary | ICD-10-CM | POA: Insufficient documentation

## 2012-08-28 DIAGNOSIS — M79609 Pain in unspecified limb: Secondary | ICD-10-CM | POA: Insufficient documentation

## 2012-08-28 NOTE — Progress Notes (Signed)
Venous Duplex Imaging - Complete Jacqueline Woodard

## 2012-09-09 ENCOUNTER — Encounter: Payer: Self-pay | Admitting: Internal Medicine

## 2012-09-09 ENCOUNTER — Telehealth: Payer: Self-pay | Admitting: *Deleted

## 2012-09-09 ENCOUNTER — Ambulatory Visit (INDEPENDENT_AMBULATORY_CARE_PROVIDER_SITE_OTHER): Payer: BC Managed Care – PPO | Admitting: Internal Medicine

## 2012-09-09 VITALS — BP 102/78 | HR 84 | Ht 61.0 in | Wt 214.0 lb

## 2012-09-09 DIAGNOSIS — I890 Lymphedema, not elsewhere classified: Secondary | ICD-10-CM

## 2012-09-09 MED ORDER — HYDROCHLOROTHIAZIDE 12.5 MG PO CAPS: 12.5000 mg | ORAL_CAPSULE | Freq: Every day | ORAL | Status: DC | PRN

## 2012-09-09 NOTE — Patient Instructions (Addendum)
Dr Rennis Golden has sent in a prescription for Hydrochlorothiazide 12.5mg  as needed for swelling.  You can take this up to one tablet a day.  Dr Rennis Golden will see you back on an as needed basis.

## 2012-09-09 NOTE — Telephone Encounter (Signed)
Message copied by Chauncey Reading on Wed Sep 09, 2012  1:07 PM ------      Message from: Chrystie Nose      Created: Fri Sep 04, 2012  6:32 PM       No varicose veins noted. Normal study.            Dr. Rennis Golden ------

## 2012-09-09 NOTE — Telephone Encounter (Signed)
Result letter drafted and mailed to pt.  

## 2012-09-09 NOTE — Progress Notes (Signed)
OFFICE NOTE  Chief Complaint:  New patient visit for lower extremity swelling and pain  Primary Care Physician: Dorrene German, MD  HPI:  Jacqueline Woodard is a 27 year old female who presents for evaluation of right leg pain. She's been followed by Dr. Fanny Dance, for problems with flat feet. She's had an issue with the right lower extremity swelling for several months two-year period she was referred to see Dr. Arbie Cookey with pain and vascular surgery. She underwent multiple venous studies in the past to rule out DVT due to assymetric swelling. She also underwent a Doppler by Dr. Arbie Cookey in the office to look for venous reflux. This is reportedly negative or very mildly positive with reflux at the saphenofemoral junction. His diagnosis was that this is more consistent with lymphedema, without any significant provokable reflux. And that the patient would not benefit from venous closure.  She has been wearing stockings for greater than 3 months with minimal improvement in her swelling. I saw her last week for a second opinion and we obtained repeat dopplers.  The ultrasounds demonstrate no varicose veins.  PMHx:  Past Medical History  Diagnosis Date  . Migraine   . Asthma     History reviewed. No pertinent past surgical history.  FAMHx:  Family History  Problem Relation Age of Onset  . Thyroid disease Mother   . Other Father     SOCHx:   reports that she has never smoked. She has never used smokeless tobacco. She reports that  drinks alcohol. She reports that she does not use illicit drugs.  ALLERGIES:  Allergies  Allergen Reactions  . Shellfish Allergy Itching and Swelling    Throat and eyes start itching and swelling    ROS: A comprehensive review of systems was negative except for: Constitutional: positive for fatigue Musculoskeletal: positive for Leg pain, itching, heaviness and swelling mostly on the right  HOME MEDS: Current Outpatient Prescriptions  Medication Sig Dispense  Refill  . albuterol (PROVENTIL,VENTOLIN) 90 MCG/ACT inhaler Inhale 2 puffs into the lungs once as needed. For asthma       . esomeprazole (NEXIUM) 40 MG capsule Take 40 mg by mouth daily before breakfast.      . hydrochlorothiazide (MICROZIDE) 12.5 MG capsule Take 1 capsule (12.5 mg total) by mouth daily as needed.  30 capsule  1   No current facility-administered medications for this visit.    LABS/IMAGING: No results found for this or any previous visit (from the past 48 hour(s)). No results found.  VITALS: BP 102/78  Pulse 84  Ht 5\' 1"  (1.549 m)  Wt 214 lb (97.07 kg)  BMI 40.46 kg/m2  EXAM: Extremities: There are no notable varicose veins, no significant spider veins, and diffuse edema more at the right ankle and lower leg. The left leg demonstrates mild edema and there is a tattoo on the lateral aspect of left leg Pulses: 2+ and symmetric Skin: The skin appears tense and mildly discolored over the right ankle CEAP 3 vascular disease  EKG: deferred  ASSESSMENT: 1. CEAP 3 vascular disease, without clear signs of varicose veins.  2. Possible lymphedema  PLAN: 1.   Doppler ultrasound did not indicate venous reflux. I agree with Dr. Bosie Helper diagnosis, she most likely has lymphedema. I have fitted her and recommended wearing bilateral compression stockings. Especially when she is on her feet. In addition we've given her prescription for low dose diuretic which he can take as needed. Followup will be as needed. Thank you again for  the kind referral.  Chrystie Nose, MD, [facility] Attending Cardiologist [facility]  HILTY,Kenneth C 09/09/2012, 2:04 PM

## 2013-01-05 ENCOUNTER — Encounter (HOSPITAL_COMMUNITY): Payer: Self-pay | Admitting: Emergency Medicine

## 2013-01-05 ENCOUNTER — Emergency Department (HOSPITAL_COMMUNITY)
Admission: EM | Admit: 2013-01-05 | Discharge: 2013-01-05 | Disposition: A | Discharge status: Home / Self Care | Payer: BC Managed Care – PPO | Attending: Emergency Medicine | Admitting: Emergency Medicine

## 2013-01-05 DIAGNOSIS — Z8679 Personal history of other diseases of the circulatory system: Secondary | ICD-10-CM | POA: Insufficient documentation

## 2013-01-05 DIAGNOSIS — J45909 Unspecified asthma, uncomplicated: Secondary | ICD-10-CM | POA: Insufficient documentation

## 2013-01-05 DIAGNOSIS — R110 Nausea: Secondary | ICD-10-CM

## 2013-01-05 DIAGNOSIS — Z3202 Encounter for pregnancy test, result negative: Secondary | ICD-10-CM | POA: Insufficient documentation

## 2013-01-05 DIAGNOSIS — Z79899 Other long term (current) drug therapy: Secondary | ICD-10-CM | POA: Insufficient documentation

## 2013-01-05 LAB — URINALYSIS, ROUTINE W REFLEX MICROSCOPIC
Bilirubin Urine: NEGATIVE
Ketones, ur: NEGATIVE mg/dL
Leukocytes, UA: NEGATIVE
Nitrite: NEGATIVE
Protein, ur: NEGATIVE mg/dL
Urobilinogen, UA: 0.2 mg/dL (ref 0.0–1.0)

## 2013-01-05 LAB — CBC WITH DIFFERENTIAL/PLATELET
Eosinophils Absolute: 0.1 10*3/uL (ref 0.0–0.7)
Hemoglobin: 12.1 g/dL (ref 12.0–15.0)
Lymphocytes Relative: 33 % (ref 12–46)
Lymphs Abs: 4.4 10*3/uL — ABNORMAL HIGH (ref 0.7–4.0)
MCH: 25.9 pg — ABNORMAL LOW (ref 26.0–34.0)
MCV: 81.8 fL (ref 78.0–100.0)
Monocytes Relative: 9 % (ref 3–12)
Neutrophils Relative %: 57 % (ref 43–77)
RBC: 4.67 MIL/uL (ref 3.87–5.11)
WBC: 13.5 10*3/uL — ABNORMAL HIGH (ref 4.0–10.5)

## 2013-01-05 LAB — COMPREHENSIVE METABOLIC PANEL
ALT: <5 U/L (ref 0–35)
Alkaline Phosphatase: 117 U/L (ref 39–117)
BUN: 11 mg/dL (ref 6–23)
CO2: 24 mEq/L (ref 19–32)
GFR calc Af Amer: >90 mL/min (ref >90)
GFR calc non Af Amer: >90 mL/min (ref >90)
Glucose, Bld: 81 mg/dL (ref 70–99)
Potassium: 3.9 mEq/L (ref 3.5–5.1)
Total Bilirubin: 0.2 mg/dL — ABNORMAL LOW (ref 0.3–1.2)
Total Protein: 7.5 g/dL (ref 6.0–8.3)

## 2013-01-05 MED ORDER — GI COCKTAIL ~~LOC~~
30.0000 mL | Freq: Once | ORAL | Status: AC
  Administered 2013-01-05: 30 mL via ORAL
  Filled 2013-01-05: qty 30

## 2013-01-05 MED ORDER — ONDANSETRON HCL 4 MG/2ML IJ SOLN
4.0000 mg | Freq: Once | INTRAMUSCULAR | Status: AC
  Administered 2013-01-05: 4 mg via INTRAVENOUS
  Filled 2013-01-05: qty 2

## 2013-01-05 MED ORDER — SUCRALFATE 1 GM/10ML PO SUSP: 1.0000 g | Freq: Four times a day (QID) | ORAL | Status: DC

## 2013-01-05 MED ORDER — SODIUM CHLORIDE 0.9 % IV BOLUS (SEPSIS)
1000.0000 mL | Freq: Once | INTRAVENOUS | Status: AC
  Administered 2013-01-05: 1000 mL via INTRAVENOUS

## 2013-01-05 NOTE — ED Provider Notes (Signed)
CSN: [id number]     Arrival date & time 01/05/13  1131 History   First MD Initiated Contact with Patient 01/05/13 1152     Chief Complaint  Patient presents with  . Nausea   (Consider location/radiation/quality/duration/timing/severity/associated sxs/prior Treatment) HPI Patient presents with nausea. Symptoms began subacutely approximately 4 days ago.  Since onset symptoms have been persistent. There is no associated focal pain.  There has been no vomiting.  Patient notes increased frequency of stool, but no diarrhea. No fevers, no chills, no chest pain, dyspnea. No new activities, no pneumatized. Patient continues to take Nexium, with no change in her condition.  Past Medical History  Diagnosis Date  . Migraine   . Asthma    History reviewed. No pertinent past surgical history. Family History  Problem Relation Age of Onset  . Thyroid disease Mother   . Other Father    History  Substance Use Topics  . Smoking status: Never Smoker   . Smokeless tobacco: Never Used  . Alcohol Use: Yes     Comment: social   OB History   Grav Para Term Preterm Abortions TAB SAB Ect Mult Living                 Review of Systems  Constitutional:       Per HPI, otherwise negative  HENT:       Per HPI, otherwise negative  Respiratory:       Per HPI, otherwise negative  Cardiovascular:       Per HPI, otherwise negative  Gastrointestinal: Positive for nausea. Negative for vomiting and diarrhea.  Endocrine:       Negative aside from HPI  Genitourinary:       Neg aside from HPI   Musculoskeletal:       Per HPI, otherwise negative  Skin: Negative.   Neurological: Negative for syncope.    Allergies  Shellfish allergy  Home Medications   Current Outpatient Rx  Name  Route  Sig  Dispense  Refill  . albuterol (PROVENTIL,VENTOLIN) 90 MCG/ACT inhaler   Inhalation   Inhale 2 puffs into the lungs once as needed. For asthma          . esomeprazole (NEXIUM) 40 MG capsule   Oral  Take 40 mg by mouth daily before breakfast.         . hydrochlorothiazide (MICROZIDE) 12.5 MG capsule   Oral   Take 1 capsule (12.5 mg total) by mouth daily as needed.   30 capsule   1    BP 113/75  Pulse 82  Temp(Src) 97.8 F (36.6 C)  Resp 20  SpO2 98%  LMP 12/02/2012 Physical Exam  Nursing note and vitals reviewed. Constitutional: She is oriented to person, place, and time. She appears well-developed and well-nourished. No distress.  HENT:  Head: Normocephalic and atraumatic.  Eyes: Conjunctivae and EOM are normal.  Cardiovascular: Normal rate and regular rhythm.   Pulmonary/Chest: Effort normal and breath sounds normal. No stridor. No respiratory distress.  Abdominal: She exhibits no distension. There is no tenderness.  Musculoskeletal: She exhibits no edema.  Neurological: She is alert and oriented to person, place, and time. No cranial nerve deficit.  Skin: Skin is warm and dry.  Psychiatric: She has a normal mood and affect.    ED Course  Procedures (including critical care time) Labs Review Labs Reviewed  CBC WITH DIFFERENTIAL  COMPREHENSIVE METABOLIC PANEL  URINALYSIS, ROUTINE W REFLEX MICROSCOPIC   Imaging Review No results found.  EKG Interpretation   None      3:22 PM Patient eating. No new complaints, no pain. Refill a discussion on possible causes of her nausea.  She has a physician with whom she can followup. She now adds that on endoscopy greater than one year ago, she had identified ulcerations MDM  This generally well-appearing young female with history of heartburn now presents with no nausea, but no pain.  On exam she is awake and alert, he medically unremarkable.  There is mild leukocytosis, but otherwise labs are reassuring, for the already low suspicion of ongoing hepatobiliary dysfunction or pancreatic dysfunction.  The patient improved here with fluids, medication, and was hungry.  With no evidence of distress, notable lab findings again  mild leukocytosis, she was appropriate for discharge with outpatient followup.    Gerhard Munch, MD 01/05/13 1524

## 2013-01-05 NOTE — ED Notes (Signed)
Reports having nausea x 3-4 days. Denies vomiting, diarrhea or abd pain.

## 2013-01-05 NOTE — ED Notes (Signed)
Nausea x 3-4 days no vomiting has had  More stool/ bm but has not been loose has had stronger smell she staes has been taking her nexium but has not helped  Denies dysuria fever or rash or abd pain

## 2013-01-05 NOTE — ED Notes (Signed)
Pt assisted up to [facility]

## 2013-01-05 NOTE — ED Notes (Signed)
Pt states that her stomach is starting to hurt and requested crackers because she had not eaten was given grahams

## 2013-01-05 NOTE — ED Notes (Signed)
Unable to obtain e-signature due to signature pad not working.  Pt verbalized understanding of d/c and f/u.  No additional questions by pt regarding d/c.  Pt ambulatory at discharge. VSS.

## 2013-02-23 ENCOUNTER — Encounter (HOSPITAL_BASED_OUTPATIENT_CLINIC_OR_DEPARTMENT_OTHER): Payer: Self-pay | Admitting: *Deleted

## 2013-03-01 ENCOUNTER — Encounter (HOSPITAL_BASED_OUTPATIENT_CLINIC_OR_DEPARTMENT_OTHER): Payer: Self-pay | Admitting: Anesthesiology

## 2013-03-01 ENCOUNTER — Ambulatory Visit (HOSPITAL_BASED_OUTPATIENT_CLINIC_OR_DEPARTMENT_OTHER): Payer: BC Managed Care – PPO | Admitting: Anesthesiology

## 2013-03-01 ENCOUNTER — Encounter (HOSPITAL_BASED_OUTPATIENT_CLINIC_OR_DEPARTMENT_OTHER)
Admission: RE | Disposition: A | Discharge status: Home / Self Care | Payer: Self-pay | Source: Ambulatory Visit | Attending: Specialist

## 2013-03-01 ENCOUNTER — Ambulatory Visit (HOSPITAL_BASED_OUTPATIENT_CLINIC_OR_DEPARTMENT_OTHER)
Admission: RE | Admit: 2013-03-01 | Discharge: 2013-03-02 | Disposition: A | Discharge status: Home / Self Care | Payer: BC Managed Care – PPO | Source: Ambulatory Visit | Attending: Specialist | Admitting: Specialist

## 2013-03-01 ENCOUNTER — Encounter (HOSPITAL_BASED_OUTPATIENT_CLINIC_OR_DEPARTMENT_OTHER): Payer: BC Managed Care – PPO | Admitting: Anesthesiology

## 2013-03-01 DIAGNOSIS — N62 Hypertrophy of breast: Secondary | ICD-10-CM | POA: Insufficient documentation

## 2013-03-01 HISTORY — PX: BREAST REDUCTION SURGERY: SHX8

## 2013-03-01 HISTORY — DX: Gastro-esophageal reflux disease without esophagitis: K21.9

## 2013-03-01 HISTORY — DX: Personal history of other diseases of the digestive system: Z87.19

## 2013-03-01 LAB — POCT HEMOGLOBIN-HEMACUE: Hemoglobin: 11 g/dL — ABNORMAL LOW (ref 12.0–15.0)

## 2013-03-01 SURGERY — MAMMOPLASTY, REDUCTION
Anesthesia: General | Site: Breast | Laterality: Bilateral

## 2013-03-01 MED ORDER — MORPHINE SULFATE 10 MG/ML IJ SOLN
INTRAMUSCULAR | Status: AC
  Filled 2013-03-01: qty 1

## 2013-03-01 MED ORDER — HYDROMORPHONE HCL PF 1 MG/ML IJ SOLN
INTRAMUSCULAR | Status: AC
  Filled 2013-03-01: qty 1

## 2013-03-01 MED ORDER — CEFAZOLIN SODIUM-DEXTROSE 2-3 GM-% IV SOLR
2.0000 g | INTRAVENOUS | Status: AC
  Administered 2013-03-01: 2 g via INTRAVENOUS

## 2013-03-01 MED ORDER — ONDANSETRON HCL 4 MG/2ML IJ SOLN
INTRAMUSCULAR | Status: DC | PRN
  Administered 2013-03-01: 4 mg via INTRAVENOUS

## 2013-03-01 MED ORDER — PROPOFOL 10 MG/ML IV BOLUS
INTRAVENOUS | Status: DC | PRN
  Administered 2013-03-01: 200 mg via INTRAVENOUS
  Administered 2013-03-01 (×2): 50 mg via INTRAVENOUS

## 2013-03-01 MED ORDER — ONDANSETRON HCL 4 MG/2ML IJ SOLN: 4.0000 mg | Freq: Four times a day (QID) | INTRAMUSCULAR | Status: DC | PRN

## 2013-03-01 MED ORDER — HYDROCODONE-ACETAMINOPHEN 5-325 MG PO TABS
1.0000 | ORAL_TABLET | ORAL | Status: DC | PRN
  Administered 2013-03-02 (×2): 2 via ORAL

## 2013-03-01 MED ORDER — SODIUM CHLORIDE 0.9 % IV SOLN
25.0000 mg | Freq: Four times a day (QID) | INTRAVENOUS | Status: AC | PRN
  Administered 2013-03-01: 25 mg via INTRAVENOUS

## 2013-03-01 MED ORDER — MIDAZOLAM HCL 2 MG/2ML IJ SOLN
INTRAMUSCULAR | Status: AC
  Filled 2013-03-01: qty 2

## 2013-03-01 MED ORDER — MORPHINE SULFATE 2 MG/ML IJ SOLN
2.0000 mg | INTRAMUSCULAR | Status: DC | PRN
  Administered 2013-03-01: 2 mg via INTRAVENOUS

## 2013-03-01 MED ORDER — CEFAZOLIN SODIUM 1 G IJ SOLR
INTRAMUSCULAR | Status: AC
  Filled 2013-03-01: qty 10

## 2013-03-01 MED ORDER — FENTANYL CITRATE 0.05 MG/ML IJ SOLN
INTRAMUSCULAR | Status: DC | PRN
  Administered 2013-03-01: 100 ug via INTRAVENOUS

## 2013-03-01 MED ORDER — SODIUM CHLORIDE 0.9 % IV SOLN
INTRAVENOUS | Status: DC | PRN
  Administered 2013-03-01: 08:00:00 via INTRAMUSCULAR

## 2013-03-01 MED ORDER — FENTANYL CITRATE 0.05 MG/ML IJ SOLN
50.0000 ug | INTRAMUSCULAR | Status: DC | PRN
Start: 2013-03-01 — End: 2013-03-01

## 2013-03-01 MED ORDER — ALBUTEROL SULFATE (5 MG/ML) 0.5% IN NEBU
INHALATION_SOLUTION | RESPIRATORY_TRACT | Status: DC | PRN
  Administered 2013-03-01: 2.5 mg via RESPIRATORY_TRACT

## 2013-03-01 MED ORDER — DEXTROSE IN LACTATED RINGERS 5 % IV SOLN: INTRAVENOUS | Status: DC

## 2013-03-01 MED ORDER — MIDAZOLAM HCL 2 MG/ML PO SYRP: 12.0000 mg | ORAL_SOLUTION | Freq: Once | ORAL | Status: DC | PRN

## 2013-03-01 MED ORDER — ALBUTEROL 90 MCG/ACT IN AERS: 2.0000 | INHALATION_SPRAY | RESPIRATORY_TRACT | Status: DC | PRN

## 2013-03-01 MED ORDER — MORPHINE SULFATE 2 MG/ML IJ SOLN
INTRAMUSCULAR | Status: AC
  Filled 2013-03-01: qty 1

## 2013-03-01 MED ORDER — PHENYLEPHRINE HCL 10 MG/ML IJ SOLN
10.0000 mg | INTRAVENOUS | Status: DC | PRN
  Administered 2013-03-01: 50 ug/min via INTRAVENOUS

## 2013-03-01 MED ORDER — ONDANSETRON HCL 4 MG PO TABS: 4.0000 mg | ORAL_TABLET | Freq: Four times a day (QID) | ORAL | Status: DC | PRN

## 2013-03-01 MED ORDER — LIDOCAINE-EPINEPHRINE 0.5 %-1:200000 IJ SOLN
INTRAMUSCULAR | Status: AC
  Filled 2013-03-01: qty 2

## 2013-03-01 MED ORDER — LIDOCAINE HCL (CARDIAC) 20 MG/ML IV SOLN
INTRAVENOUS | Status: DC | PRN
  Administered 2013-03-01: 60 mg via INTRAVENOUS

## 2013-03-01 MED ORDER — CEFAZOLIN SODIUM-DEXTROSE 2-3 GM-% IV SOLR
INTRAVENOUS | Status: AC
  Filled 2013-03-01: qty 50

## 2013-03-01 MED ORDER — LACTATED RINGERS IV SOLN
INTRAVENOUS | Status: DC
  Administered 2013-03-01 (×2): via INTRAVENOUS

## 2013-03-01 MED ORDER — DEXAMETHASONE SODIUM PHOSPHATE 4 MG/ML IJ SOLN
INTRAMUSCULAR | Status: DC | PRN
  Administered 2013-03-01: 10 mg via INTRAVENOUS

## 2013-03-01 MED ORDER — FENTANYL CITRATE 0.05 MG/ML IJ SOLN
INTRAMUSCULAR | Status: AC
  Filled 2013-03-01: qty 2

## 2013-03-01 MED ORDER — HYDROMORPHONE HCL PF 1 MG/ML IJ SOLN
0.2500 mg | INTRAMUSCULAR | Status: DC | PRN
  Administered 2013-03-01 (×3): 0.5 mg via INTRAVENOUS

## 2013-03-01 MED ORDER — HYDROCODONE-ACETAMINOPHEN 5-325 MG PO TABS
ORAL_TABLET | ORAL | Status: AC
  Filled 2013-03-01: qty 2

## 2013-03-01 MED ORDER — MIDAZOLAM HCL 2 MG/2ML IJ SOLN: 1.0000 mg | INTRAMUSCULAR | Status: DC | PRN

## 2013-03-01 MED ORDER — MIDAZOLAM HCL 5 MG/5ML IJ SOLN
INTRAMUSCULAR | Status: DC | PRN
  Administered 2013-03-01: 2 mg via INTRAVENOUS

## 2013-03-01 MED ORDER — CEFAZOLIN SODIUM 1-5 GM-% IV SOLN
1.0000 g | Freq: Three times a day (TID) | INTRAVENOUS | Status: DC
  Administered 2013-03-01: 1 g via INTRAVENOUS

## 2013-03-01 MED ORDER — SUCCINYLCHOLINE CHLORIDE 20 MG/ML IJ SOLN
INTRAMUSCULAR | Status: DC | PRN
  Administered 2013-03-01: 100 mg via INTRAVENOUS

## 2013-03-01 MED ORDER — MORPHINE SULFATE 10 MG/ML IJ SOLN
INTRAMUSCULAR | Status: DC | PRN
  Administered 2013-03-01: 2 mg via INTRAVENOUS
  Administered 2013-03-01: 1 mg via INTRAVENOUS
  Administered 2013-03-01: 2 mg via INTRAVENOUS
  Administered 2013-03-01: 1 mg via INTRAVENOUS

## 2013-03-01 SURGICAL SUPPLY — 60 items
BAG DECANTER FOR FLEXI CONT (MISCELLANEOUS) ×2 IMPLANT
BENZOIN TINCTURE PRP APPL 2/3 (GAUZE/BANDAGES/DRESSINGS) ×4 IMPLANT
BINDER BREAST 3XL (BIND) ×4 IMPLANT
BINDER BREAST XLRG (GAUZE/BANDAGES/DRESSINGS) IMPLANT
BINDER BREAST XXLRG (GAUZE/BANDAGES/DRESSINGS) IMPLANT
BLADE KNIFE  20 PERSONNA (BLADE) ×2
BLADE KNIFE 20 PERSONNA (BLADE) ×2 IMPLANT
BLADE KNIFE PERSONA 10 (BLADE) ×6 IMPLANT
BLADE KNIFE PERSONA 15 (BLADE) ×2 IMPLANT
CANISTER SUCT 1200ML W/VALVE (MISCELLANEOUS) ×2 IMPLANT
COVER MAYO STAND STRL (DRAPES) ×2 IMPLANT
COVER TABLE BACK 60X90 (DRAPES) ×2 IMPLANT
DECANTER SPIKE VIAL GLASS SM (MISCELLANEOUS) ×4 IMPLANT
DRAIN CHANNEL 10F 3/8 F FF (DRAIN) ×4 IMPLANT
DRAPE LAPAROSCOPIC ABDOMINAL (DRAPES) ×2 IMPLANT
DRAPE UTILITY XL STRL (DRAPES) ×2 IMPLANT
ELECT REM PT RETURN 9FT ADLT (ELECTROSURGICAL) ×2
ELECTRODE REM PT RTRN 9FT ADLT (ELECTROSURGICAL) ×1 IMPLANT
EVACUATOR SILICONE 100CC (DRAIN) ×4 IMPLANT
FILTER 7/8 IN (FILTER) IMPLANT
GAUZE XEROFORM 5X9 LF (GAUZE/BANDAGES/DRESSINGS) ×4 IMPLANT
GLOVE BIO SURGEON STRL SZ 6.5 (GLOVE) ×2 IMPLANT
GLOVE BIOGEL M STRL SZ7.5 (GLOVE) ×2 IMPLANT
GLOVE BIOGEL PI IND STRL 8 (GLOVE) ×1 IMPLANT
GLOVE BIOGEL PI INDICATOR 8 (GLOVE) ×1
GLOVE ECLIPSE 7.0 STRL STRAW (GLOVE) ×2 IMPLANT
GOWN PREVENTION PLUS XXLARGE (GOWN DISPOSABLE) ×4 IMPLANT
IV NS 500ML (IV SOLUTION) ×1
IV NS 500ML BAXH (IV SOLUTION) ×1 IMPLANT
NEEDLE SPNL 18GX3.5 QUINCKE PK (NEEDLE) ×2 IMPLANT
NS IRRIG 1000ML POUR BTL (IV SOLUTION) IMPLANT
PACK BASIN DAY SURGERY FS (CUSTOM PROCEDURE TRAY) ×2 IMPLANT
PAD ABD 8X10 STRL (GAUZE/BANDAGES/DRESSINGS) ×8 IMPLANT
PEN SKIN MARKING BROAD TIP (MISCELLANEOUS) ×2 IMPLANT
PILLOW FOAM RUBBER ADULT (PILLOWS) ×2 IMPLANT
PIN SAFETY STERILE (MISCELLANEOUS) ×2 IMPLANT
SHEETING SILICONE GEL EPI DERM (MISCELLANEOUS) IMPLANT
SLEEVE SCD COMPRESS KNEE MED (MISCELLANEOUS) ×2 IMPLANT
SPECIMEN JAR MEDIUM (MISCELLANEOUS) IMPLANT
SPECIMEN JAR X LARGE (MISCELLANEOUS) ×4 IMPLANT
SPONGE GAUZE 4X4 12PLY (GAUZE/BANDAGES/DRESSINGS) ×4 IMPLANT
SPONGE LAP 18X18 X RAY DECT (DISPOSABLE) ×8 IMPLANT
STRIP SUTURE WOUND CLOSURE 1/2 (SUTURE) ×10 IMPLANT
SUT MNCRL AB 3-0 PS2 18 (SUTURE) ×16 IMPLANT
SUT MON AB 2-0 CT1 36 (SUTURE) IMPLANT
SUT MON AB 5-0 PS2 18 (SUTURE) ×4 IMPLANT
SUT PROLENE 2 0 CT2 30 (SUTURE) ×4 IMPLANT
SUT PROLENE 3 0 PS 2 (SUTURE) ×12 IMPLANT
SYR 50ML LL SCALE MARK (SYRINGE) ×4 IMPLANT
SYR CONTROL 10ML LL (SYRINGE) IMPLANT
TAPE HYPAFIX 6X30 (GAUZE/BANDAGES/DRESSINGS) ×2 IMPLANT
TAPE MEASURE 72IN RETRACT (INSTRUMENTS)
TAPE MEASURE LINEN 72IN RETRCT (INSTRUMENTS) IMPLANT
TAPE PAPER MEDFIX 1IN X 10YD (GAUZE/BANDAGES/DRESSINGS) ×2 IMPLANT
TOWEL OR 17X24 6PK STRL BLUE (TOWEL DISPOSABLE) ×4 IMPLANT
TOWEL OR NON WOVEN STRL DISP B (DISPOSABLE) ×2 IMPLANT
TUBE CONNECTING 20X1/4 (TUBING) ×2 IMPLANT
UNDERPAD 30X30 INCONTINENT (UNDERPADS AND DIAPERS) ×6 IMPLANT
VAC PENCILS W/TUBING CLEAR (MISCELLANEOUS) ×2 IMPLANT
YANKAUER SUCT BULB TIP NO VENT (SUCTIONS) ×2 IMPLANT

## 2013-03-01 NOTE — Anesthesia Preprocedure Evaluation (Signed)
Anesthesia Evaluation  Patient identified by MRN, date of birth, ID band Patient awake    Reviewed: Allergy & Precautions, H&P , NPO status , Patient's Chart, lab work & pertinent test results  Airway Mallampati: II      Dental   Pulmonary asthma ,          Cardiovascular negative cardio ROS      Neuro/Psych    GI/Hepatic GERD-  ,  Endo/Other  negative endocrine ROS  Renal/GU negative Renal ROS     Musculoskeletal   Abdominal   Peds  Hematology   Anesthesia Other Findings   Reproductive/Obstetrics                           Anesthesia Physical Anesthesia Plan  ASA: III  Anesthesia Plan: General   Post-op Pain Management:    Induction: Intravenous  Airway Management Planned: Oral ETT  Additional Equipment:   Intra-op Plan:   Post-operative Plan: Extubation in OR  Informed Consent: I have reviewed the patients History and Physical, chart, labs and discussed the procedure including the risks, benefits and alternatives for the proposed anesthesia with the patient or authorized representative who has indicated his/her understanding and acceptance.   Dental advisory given  Plan Discussed with: CRNA and Anesthesiologist  Anesthesia Plan Comments:         Anesthesia Quick Evaluation

## 2013-03-01 NOTE — Anesthesia Postprocedure Evaluation (Signed)
  Anesthesia Post-op Note  Patient: Jacqueline Woodard  Procedure(s) Performed: Procedure(s): BILATERAL MAMMARY REDUCTION  (BREAST) (Bilateral)  Patient Location: PACU  Anesthesia Type:General  Level of Consciousness: awake  Airway and Oxygen Therapy: Patient Spontanous Breathing  Post-op Pain: mild  Post-op Assessment: Post-op Vital signs reviewed  Post-op Vital Signs: Reviewed  Complications: No apparent anesthesia complications

## 2013-03-01 NOTE — Brief Op Note (Signed)
03/01/2013  10:41 AM  PATIENT:  Jacqueline Woodard  27 y.o. female  PRE-OPERATIVE DIAGNOSIS:  BILATERAL MACROMASTIA  POST-OPERATIVE DIAGNOSIS:  BILATERAL MACROMASTIA  PROCEDURE:  Procedure(s): BILATERAL MAMMARY REDUCTION  (BREAST) (Bilateral)  SURGEON:  Surgeon(s) and Role:    * Louisa Second, MD - Primary  PHYSICIAN ASSISTANT:   ASSISTANTS: none   ANESTHESIA:   general  EBL:  Total I/O In: 2000 [I.V.:2000] Out: -   BLOOD ADMINISTERED:none  DRAINS: (right and left lateral chest areas) Jackson-Pratt drain(s) with closed bulb suction in the right and left lateral chest areas   LOCAL MEDICATIONS USED:  LIDOCAINE   SPECIMEN:  Excision  DISPOSITION OF SPECIMEN:  PATHOLOGY  COUNTS:  YES  TOURNIQUET:  * No tourniquets in log *  DICTATION: .Other Dictation: Dictation Number [id number]  PLAN OF CARE: Discharge to home after PACU  PATIENT DISPOSITION:  PACU - hemodynamically stable.   Delay start of Pharmacological VTE agent (>24hrs) due to surgical blood loss or risk of bleeding: yes

## 2013-03-01 NOTE — Anesthesia Procedure Notes (Signed)
Procedure Name: Intubation Performed by: York Grice Pre-anesthesia Checklist: Patient identified, Emergency Drugs available, Suction available, Patient being monitored and Timeout performed Patient Re-evaluated:Patient Re-evaluated prior to inductionOxygen Delivery Method: Circle system utilized Preoxygenation: Pre-oxygenation with 100% oxygen Intubation Type: IV induction Ventilation: Mask ventilation without difficulty Laryngoscope Size: Miller and 2 Grade View: Grade II Tube type: Oral Tube size: 7.0 mm Number of attempts: 1 Airway Equipment and Method: Stylet Placement Confirmation: ETT inserted through vocal cords under direct vision,  breath sounds checked- equal and bilateral and positive ETCO2 Secured at: 23 cm Tube secured with: Tape Dental Injury: Teeth and Oropharynx as per pre-operative assessment

## 2013-03-01 NOTE — Transfer of Care (Signed)
Immediate Anesthesia Transfer of Care Note  Patient: Jacqueline Woodard  Procedure(s) Performed: Procedure(s): BILATERAL MAMMARY REDUCTION  (BREAST) (Bilateral)  Patient Location: PACU  Anesthesia Type:General  Level of Consciousness: awake, alert  and oriented  Airway & Oxygen Therapy: Patient Spontanous Breathing and Patient connected to face mask oxygen  Post-op Assessment: Report given to PACU RN and Post -op Vital signs reviewed and stable  Post vital signs: Reviewed and stable  Complications: No apparent anesthesia complications

## 2013-03-01 NOTE — H&P (Signed)
Jacqueline Woodard is an 27 y.o. female.   Chief Complaint: Increased macromastia HPI: Increased back and shoulder pain sec to large pendulous breasts and accessory breast tissue. fAILED CONSERVATIVE TREATMENT  Past Medical History  Diagnosis Date  . Migraine   . GERD (gastroesophageal reflux disease)   . History of gastric ulcer   . Lymphedema of leg     right - no current med.  . Macromastia 02/2013  . Asthma     no inhaler use in months    Past Surgical History  Procedure Laterality Date  . Wisdom tooth extraction      Family History  Problem Relation Age of Onset  . Thyroid disease Mother    Social History:  reports that she has never smoked. She has never used smokeless tobacco. She reports that she does not drink alcohol or use illicit drugs.  Allergies:  Allergies  Allergen Reactions  . Shellfish Allergy Swelling    SWELLING OF FACE AND THROAT    Medications Prior to Admission  Medication Sig Dispense Refill  . esomeprazole (NEXIUM) 40 MG capsule Take 40 mg by mouth daily before breakfast.      . albuterol (PROVENTIL,VENTOLIN) 90 MCG/ACT inhaler Inhale 2 puffs into the lungs once as needed. For asthma         No results found for this or any previous visit (from the past 48 hour(s)). No results found.  Review of Systems  Constitutional: Negative.   Eyes: Negative.   Respiratory: Negative.   Cardiovascular: Negative.   Gastrointestinal: Positive for heartburn.  Genitourinary: Negative.   Musculoskeletal: Positive for back pain and neck pain.  Skin: Positive for rash.  Neurological: Positive for headaches.  Endo/Heme/Allergies: Negative.   Psychiatric/Behavioral: Negative.     Blood pressure 113/79, pulse 87, temperature 98.4 F (36.9 C), temperature source Oral, resp. rate 18, height 5\' 1"  (1.549 m), weight 96.616 kg (213 lb), last menstrual period 02/10/2013, SpO2 100.00%. Physical Exam   Assessment/Plan sEVERE SEVERE MACROMASTIA AND ACCESSORY  BREAST TISSUE for bilateral breast reductions and excision of accessory breast tissue  Jacqueline Woodard 03/01/2013, 7:16 AM

## 2013-03-02 ENCOUNTER — Encounter (HOSPITAL_BASED_OUTPATIENT_CLINIC_OR_DEPARTMENT_OTHER): Payer: Self-pay | Admitting: Specialist

## 2013-03-02 MED ORDER — HYDROCODONE-ACETAMINOPHEN 5-325 MG PO TABS
ORAL_TABLET | ORAL | Status: AC
  Filled 2013-03-02: qty 2

## 2013-03-02 NOTE — Op Note (Signed)
NAME:  Jacqueline Woodard, Jacqueline Woodard             ACCOUNT NO.:  [id number]  MEDICAL RECORD NO.:  [id number]  LOCATION:                               FACILITY:  [facility]  PHYSICIAN:  Brooke Payes L. Shariya Gaster, M.D.DATE OF BIRTH:  10/02/1985  DATE OF PROCEDURE:  03/02/2013 DATE OF DISCHARGE:  03/02/2013                              OPERATIVE REPORT   This 27-year-old lady has severe, severe macromastia and accessory breast tissue, causing increased back, shoulder pain and rashes and intertriginous changes throughout the year.  PROCEDURE DONE:  Bilateral breast reductions, as well as removal and reduction of severe accessory breast tissue.  ANESTHESIA:  General.  Preoperatively, the patient sat up and drawn for the reduction mammoplasty using the inferior pedicle technique, re-marking the nipple- areolar complex from over 37-38 cm to 23.  She underwent general anesthesia, intubated orally.  Prep was done to the chest and breast areas in routine fashion using Hibiclens soap and solution and walled off with sterile towels and drapes so as to make a sterile field.  The areas were injected with 4% Xylocaine with epinephrine 1:400,000 concentration, a total of 250 mL.  The wounds were scored with #15 blade and the skin of the inferior pedicle was de-epithelialized with #15 blades and 20 blades.  Next, a keyhole area was also debulked and out laterally, more excessive breast tissue was removed as well as large amounts of accessory breast tissue bilaterally.  Hemostasis was maintained with the Bovie anticoagulation after fascia being trimmed appropriately.  They were rotated and stayed with 3-0 Prolene sutures. Subcutaneous closure was done with 3-0 Monocryl x2 layers and then a running subcuticular stitch of 3-0 Monocryl and 5-0 Monocryl throughout the inverted T.  The wounds were drained with a #10 fully fluted Blake drains which were placed in the depths of wound and brought out through the lateral-most  portion of the incision and secured with 3-0 Prolene. Steri-Strips and soft dressing were applied including Xeroform, 4x4s, ABDs, Hypafix tape.  Estimated blood loss less than 250 mL. Complications, none.  She was then taken to recovery in excellent condition.     Jaxn Chiquito L. Keyana Guevara, M.D.     GLT/MEDQ  D:  03/01/2013  T:  03/02/2013  Job:  [id number] 

## 2013-03-02 NOTE — Progress Notes (Signed)
Pt came to [facility] from PACU w/N & V.  Has received Zofran without results. Nausea continues.  Vomiting with movement.  Dr Shon Hough called.

## 2013-03-02 NOTE — Progress Notes (Signed)
Edema 2+ of left foot unchanged since beginning of shift; slight increase of edema 0.5+ above left knee. Left foot still remains at 1+. Some "puffiness"noted by pt's mother on left hand. No tenderness. IV running well. In infiltration. All areas reviewed with mother.

## 2013-03-02 NOTE — Op Note (Deleted)
NAMESARANDA, LEGRANDE NO.:  [id number]  MEDICAL RECORD NO.:  [id number]  LOCATION:                               FACILITY:  [facility]  PHYSICIAN:  Earvin Hansen L. Laurian Edrington, M.D.DATE OF BIRTH:  12/28/1985  DATE OF PROCEDURE:  03/02/2013 DATE OF DISCHARGE:  03/02/2013                              OPERATIVE REPORT   This 27 year old lady has severe, severe macromastia and accessory breast tissue, causing increased back, shoulder pain and rashes and intertriginous changes throughout the year.  PROCEDURE DONE:  Bilateral breast reductions, as well as removal and reduction of severe accessory breast tissue.  ANESTHESIA:  General.  Preoperatively, the patient sat up and drawn for the reduction mammoplasty using the inferior pedicle technique, re-marking the nipple- areolar complex from over 37-38 cm to 23.  She underwent general anesthesia, intubated orally.  Prep was done to the chest and breast areas in routine fashion using Hibiclens soap and solution and walled off with sterile towels and drapes so as to make a sterile field.  The areas were injected with 4% Xylocaine with epinephrine 1:400,000 concentration, a total of 250 mL.  The wounds were scored with #15 blade and the skin of the inferior pedicle was de-epithelialized with #15 blades and 20 blades.  Next, a keyhole area was also debulked and out laterally, more excessive breast tissue was removed as well as large amounts of accessory breast tissue bilaterally.  Hemostasis was maintained with the Bovie anticoagulation after fascia being trimmed appropriately.  They were rotated and stayed with 3-0 Prolene sutures. Subcutaneous closure was done with 3-0 Monocryl x2 layers and then a running subcuticular stitch of 3-0 Monocryl and 5-0 Monocryl throughout the inverted T.  The wounds were drained with a #10 fully fluted Blake drains which were placed in the depths of wound and brought out through the lateral-most  portion of the incision and secured with 3-0 Prolene. Steri-Strips and soft dressing were applied including Xeroform, 4x4s, ABDs, Hypafix tape.  Estimated blood loss less than 250 mL. Complications, none.  She was then taken to recovery in excellent condition.     Yaakov Guthrie. Shon Hough, M.D.     Cathie Hoops  D:  03/01/2013  T:  03/02/2013  Job:  [id number]

## 2013-03-02 NOTE — Progress Notes (Signed)
Pt has 2+ edema left foot; unchanged from 1st shift; slight swelling above left knee. 1+edema right foot.

## 2013-03-13 ENCOUNTER — Emergency Department (HOSPITAL_COMMUNITY)
Admission: EM | Admit: 2013-03-13 | Discharge: 2013-03-13 | Disposition: A | Discharge status: Home / Self Care | Payer: BC Managed Care – PPO | Attending: Emergency Medicine | Admitting: Emergency Medicine

## 2013-03-13 ENCOUNTER — Encounter (HOSPITAL_COMMUNITY): Payer: Self-pay | Admitting: Emergency Medicine

## 2013-03-13 ENCOUNTER — Emergency Department (HOSPITAL_COMMUNITY): Payer: BC Managed Care – PPO

## 2013-03-13 DIAGNOSIS — Y838 Other surgical procedures as the cause of abnormal reaction of the patient, or of later complication, without mention of misadventure at the time of the procedure: Secondary | ICD-10-CM | POA: Insufficient documentation

## 2013-03-13 DIAGNOSIS — Z792 Long term (current) use of antibiotics: Secondary | ICD-10-CM | POA: Insufficient documentation

## 2013-03-13 DIAGNOSIS — Z8739 Personal history of other diseases of the musculoskeletal system and connective tissue: Secondary | ICD-10-CM | POA: Insufficient documentation

## 2013-03-13 DIAGNOSIS — N61 Mastitis without abscess: Secondary | ICD-10-CM | POA: Insufficient documentation

## 2013-03-13 DIAGNOSIS — T8140XA Infection following a procedure, unspecified, initial encounter: Secondary | ICD-10-CM | POA: Insufficient documentation

## 2013-03-13 DIAGNOSIS — N611 Abscess of the breast and nipple: Secondary | ICD-10-CM

## 2013-03-13 DIAGNOSIS — K219 Gastro-esophageal reflux disease without esophagitis: Secondary | ICD-10-CM | POA: Insufficient documentation

## 2013-03-13 DIAGNOSIS — Z8711 Personal history of peptic ulcer disease: Secondary | ICD-10-CM | POA: Insufficient documentation

## 2013-03-13 DIAGNOSIS — Z79899 Other long term (current) drug therapy: Secondary | ICD-10-CM | POA: Insufficient documentation

## 2013-03-13 DIAGNOSIS — Z8742 Personal history of other diseases of the female genital tract: Secondary | ICD-10-CM | POA: Insufficient documentation

## 2013-03-13 DIAGNOSIS — Z8679 Personal history of other diseases of the circulatory system: Secondary | ICD-10-CM | POA: Insufficient documentation

## 2013-03-13 DIAGNOSIS — J45909 Unspecified asthma, uncomplicated: Secondary | ICD-10-CM | POA: Insufficient documentation

## 2013-03-13 LAB — COMPREHENSIVE METABOLIC PANEL
ALT: 5 U/L (ref 0–35)
Alkaline Phosphatase: 111 U/L (ref 39–117)
BUN: 10 mg/dL (ref 6–23)
CO2: 25 mEq/L (ref 19–32)
Chloride: 102 mEq/L (ref 96–112)
GFR calc Af Amer: >90 mL/min (ref >90)
GFR calc non Af Amer: >90 mL/min (ref >90)
Glucose, Bld: 131 mg/dL — ABNORMAL HIGH (ref 70–99)
Potassium: 4 mEq/L (ref 3.5–5.1)
Sodium: 136 mEq/L (ref 135–145)
Total Bilirubin: 0.1 mg/dL — ABNORMAL LOW (ref 0.3–1.2)
Total Protein: 7.1 g/dL (ref 6.0–8.3)

## 2013-03-13 LAB — CBC WITH DIFFERENTIAL/PLATELET
Eosinophils Absolute: 0.3 10*3/uL (ref 0.0–0.7)
Hemoglobin: 9.5 g/dL — ABNORMAL LOW (ref 12.0–15.0)
Lymphocytes Relative: 36 % (ref 12–46)
Lymphs Abs: 3.9 10*3/uL (ref 0.7–4.0)
MCH: 24.6 pg — ABNORMAL LOW (ref 26.0–34.0)
MCHC: 30.8 g/dL (ref 30.0–36.0)
Monocytes Relative: 9 % (ref 3–12)
Neutro Abs: 5.6 10*3/uL (ref 1.7–7.7)
Neutrophils Relative %: 52 % (ref 43–77)
Platelets: 462 10*3/uL — ABNORMAL HIGH (ref 150–400)
RBC: 3.86 MIL/uL — ABNORMAL LOW (ref 3.87–5.11)
WBC: 10.7 10*3/uL — ABNORMAL HIGH (ref 4.0–10.5)

## 2013-03-13 MED ORDER — IOHEXOL 300 MG/ML  SOLN
80.0000 mL | Freq: Once | INTRAMUSCULAR | Status: AC | PRN
  Administered 2013-03-13: 80 mL via INTRAVENOUS

## 2013-03-13 MED ORDER — SULFAMETHOXAZOLE-TRIMETHOPRIM 800-160 MG PO TABS: 1.0000 | ORAL_TABLET | Freq: Two times a day (BID) | ORAL | Status: DC

## 2013-03-13 MED ORDER — HYDROCODONE-ACETAMINOPHEN 5-325 MG PO TABS: 2.0000 | ORAL_TABLET | ORAL | Status: DC | PRN

## 2013-03-13 MED ORDER — SULFAMETHOXAZOLE-TMP DS 800-160 MG PO TABS
1.0000 | ORAL_TABLET | Freq: Once | ORAL | Status: AC
  Administered 2013-03-13: 1 via ORAL
  Filled 2013-03-13: qty 1

## 2013-03-13 MED ORDER — MORPHINE SULFATE 2 MG/ML IJ SOLN
2.0000 mg | Freq: Once | INTRAMUSCULAR | Status: AC
  Administered 2013-03-13: 2 mg via INTRAVENOUS
  Filled 2013-03-13: qty 1

## 2013-03-13 NOTE — ED Notes (Signed)
Pt. reports drainage with odor at surgical incision  / low grade fever for several days after breast reduction surgery ( Dr. Shon Hough ) last Dec. 15 , 2014 .

## 2013-03-13 NOTE — ED Notes (Signed)
Pt had breast reduction surgery on 03/01/13 by Dr. Shon Hough. Had first post op appt on 03/08/13 and breasts were properly healing. Pt noticed foul odor eminating from right breast. Pt removed tape from bottom of breast yesterday and noted purulent exudate and foul odor from the wound bed. Pt reports taking abx post discharge for 5 days. Came to the ER for further evaluation secondary to right breast infection.

## 2013-03-13 NOTE — ED Provider Notes (Signed)
CSN: [id number]     Arrival date & time 03/13/13  0000 History   First MD Initiated Contact with Patient 03/13/13 [id number]     Chief Complaint  Patient presents with  . Post-op Problem   (Consider location/radiation/quality/duration/timing/severity/associated sxs/prior Treatment) HPI Comments: 27 year old female who presents approximately 12 days after having breast reduction surgery. The patient states that approximately 5 days ago she had her first followup visit. She had finished her postoperative antibiotics and was doing well. Since that time she has had a slight dehiscence of the right breast incision and is now having a foul odor and purulent drainage from this wound. She is not having fevers nausea vomiting abdominal pain and has no rashes surrounding the breast. She has not noted any asymmetry or swelling of the breasts.  The history is provided by the patient and a relative.    Past Medical History  Diagnosis Date  . Migraine   . GERD (gastroesophageal reflux disease)   . History of gastric ulcer   . Lymphedema of leg     right - no current med.  . Macromastia 02/2013  . Asthma     no inhaler use in months   Past Surgical History  Procedure Laterality Date  . Wisdom tooth extraction    . Breast reduction surgery Bilateral 03/01/2013    Procedure: BILATERAL MAMMARY REDUCTION  (BREAST);  Surgeon: Louisa Second, MD;  Location: [facility];  Service: Plastics;  Laterality: Bilateral;   Family History  Problem Relation Age of Onset  . Thyroid disease Mother    History  Substance Use Topics  . Smoking status: Never Smoker   . Smokeless tobacco: Never Used  . Alcohol Use: No   OB History   Grav Para Term Preterm Abortions TAB SAB Ect Mult Living                 Review of Systems  All other systems reviewed and are negative.    Allergies  Shellfish allergy  Home Medications   Current Outpatient Rx  Name  Route  Sig  Dispense  Refill  .  esomeprazole (NEXIUM) 40 MG capsule   Oral   Take 40 mg by mouth daily at 12 noon.         Marland Kitchen albuterol (PROVENTIL,VENTOLIN) 90 MCG/ACT inhaler   Inhalation   Inhale 2 puffs into the lungs once as needed. For asthma          . HYDROcodone-acetaminophen (NORCO/VICODIN) 5-325 MG per tablet   Oral   Take 2 tablets by mouth every 4 (four) hours as needed.   10 tablet   0   . sulfamethoxazole-trimethoprim (SEPTRA DS) 800-160 MG per tablet   Oral   Take 1 tablet by mouth every 12 (twelve) hours.   20 tablet   0    BP 130/79  Pulse 94  Temp(Src) 97.7 F (36.5 C) (Oral)  Resp 18  SpO2 100%  LMP 02/06/2013 Physical Exam  Nursing note and vitals reviewed. Constitutional: She appears well-developed and well-nourished. No distress.  HENT:  Head: Normocephalic and atraumatic.  Mouth/Throat: Oropharynx is clear and moist. No oropharyngeal exudate.  Eyes: Conjunctivae and EOM are normal. Pupils are equal, round, and reactive to light. Right eye exhibits no discharge. Left eye exhibits no discharge. No scleral icterus.  Neck: Normal range of motion. Neck supple. No JVD present. No thyromegaly present.  Cardiovascular: Normal rate, regular rhythm, normal heart sounds and intact distal pulses.  Exam reveals no  gallop and no friction rub.   No murmur heard. Pulmonary/Chest: Effort normal and breath sounds normal. No respiratory distress. She has no wheezes. She has no rales.  Chaperone present for breast exam. Right breast and left breasts appear symmetrical, there is no redness or significant abnormal swelling postoperatively however the right breast has a 2 cm opening in the inframammary area along the surgical site with foul-smelling odor and a scant amount of drainage. This was probed approximately 1 cm with a Q-tip very easily, no significant drainage or bleeding present. Patient has minimal tenderness with this.  Abdominal: Soft. Bowel sounds are normal. She exhibits no distension and  no mass. There is no tenderness.  Musculoskeletal: Normal range of motion. She exhibits no edema and no tenderness.  Lymphadenopathy:    She has no cervical adenopathy.  Neurological: She is alert. Coordination normal.  Skin: Skin is warm and dry. No rash noted. No erythema.  Psychiatric: She has a normal mood and affect. Her behavior is normal.    ED Course  Procedures (including critical care time) Labs Review Labs Reviewed  CBC WITH DIFFERENTIAL - Abnormal; Notable for the following:    WBC 10.7 (*)    RBC 3.86 (*)    Hemoglobin 9.5 (*)    HCT 30.8 (*)    MCH 24.6 (*)    Platelets 462 (*)    All other components within normal limits  COMPREHENSIVE METABOLIC PANEL - Abnormal; Notable for the following:    Glucose, Bld 131 (*)    Albumin 2.8 (*)    Total Bilirubin 0.1 (*)    All other components within normal limits  WOUND CULTURE   Imaging Review Ct Chest W Contrast  03/13/2013   CLINICAL DATA:  Status post recent breast reduction surgery, with worsening chest pain.  EXAM: CT CHEST WITH CONTRAST  TECHNIQUE: Multidetector CT imaging of the chest was performed during intravenous contrast administration.  CONTRAST:  80mL OMNIPAQUE IOHEXOL 300 MG/ML  SOLN  COMPARISON:  Chest radiograph performed 04/27/2009  FINDINGS: There is a mildly mosaic pattern of parenchymal attenuation within the lungs. The lungs are otherwise clear. Pulmonary vasculature is somewhat prominent. No focal consolidation, pleural effusion or pneumothorax is seen. No masses are identified.  The mediastinum is unremarkable in appearance. No mediastinal lymphadenopathy is seen. No pericardial effusion is identified. The great vessels are grossly unremarkable in appearance the visualized portions of the thyroid gland are unremarkable. No axillary lymphadenopathy is appreciated.  The breasts are incompletely assessed on this study. A small 2.3 x 1.7 cm collection of simple appearing fluid is noted at the inferior right  axilla, with associated small pocket of air. Scattered soft tissue stranding and trace fluid are seen tracking about the breasts bilaterally, with scattered tiny foci of air seen bilaterally. No defined peripherally enhancing collection of fluid is seen to suggest abscess.  The visualized portions of the liver and spleen are unremarkable. The visualized portions of the pancreas, adrenal glands and both kidneys are within normal limits.  No acute osseous abnormalities are identified.  IMPRESSION: 1. Breasts incompletely assessed on this study. Small 2.3 x 1.7 cm collection of simple appearing fluid at the inferior right axilla, with associated small pocket of air. No definite evidence of abscess. Scattered soft tissue stranding and trace fluid tracking about the breasts bilaterally, with scattered tiny foci of air seen bilaterally. 2. Mildly mosaic pattern of parenchymal attenuation within the lungs. Lungs otherwise clear.   Electronically Signed   By: Leotis Shames  Chang M.D.   On: 03/13/2013 04:39    EKG Interpretation   None       MDM   1. Abscess of right breast    Postoperatively the patient has no fever, no significant leukocytosis but does have an open wound with drainage. I would be concerned about applying ultrasound jelly to open wounds, CT scan would also give good visualization of the breast tissue and possible abscess, otherwise patient does not appear acutely ill.  CT scan shows no signs of acute abscess in requiring drainage, antibiotic started, patient informed of results and encouraged to followup on Monday with surgeon, understanding expressed. Patient appears stable, hemodynamically stable and afebrile.   Meds given in ED:  Medications  sulfamethoxazole-trimethoprim (BACTRIM DS) 800-160 MG per tablet 1 tablet (not administered)  iohexol (OMNIPAQUE) 300 MG/ML solution 80 mL (80 mLs Intravenous Contrast Given 03/13/13 0339)  morphine 2 MG/ML injection 2 mg (2 mg Intravenous Given  03/13/13 0440)    New Prescriptions   HYDROCODONE-ACETAMINOPHEN (NORCO/VICODIN) 5-325 MG PER TABLET    Take 2 tablets by mouth every 4 (four) hours as needed.   SULFAMETHOXAZOLE-TRIMETHOPRIM (SEPTRA DS) 800-160 MG PER TABLET    Take 1 tablet by mouth every 12 (twelve) hours.      Vida Roller, MD 03/13/13 [phone]

## 2013-03-15 LAB — WOUND CULTURE

## 2013-03-16 ENCOUNTER — Telehealth (HOSPITAL_COMMUNITY): Payer: Self-pay | Admitting: Emergency Medicine

## 2013-03-16 NOTE — ED Notes (Signed)
Post ED Visit - Positive Culture Follow-up  Culture report reviewed by antimicrobial stewardship pharmacist: []  Wes Dulaney, Pharm.D., BCPS []  Celedonio Miyamoto, Pharm.D., BCPS []  Georgina Pillion, Pharm.D., BCPS []  [place], [address].D., BCPS, AAHIVP []  Estella Husk, Pharm.D., BCPS, AAHIVP [x]  Harland German, Pharm.D., BCPS  Positive wound culture Treated with Sulfa-Trimeth, organism sensitive to the same and no further patient follow-up is required at this time.  Marcelle Overlie, Jenel Lucks 03/16/2013, 11:45 AM

## 2013-03-18 DIAGNOSIS — Z8711 Personal history of peptic ulcer disease: Secondary | ICD-10-CM

## 2013-03-18 HISTORY — DX: Personal history of peptic ulcer disease: Z87.11

## 2013-03-18 HISTORY — PX: BREAST SURGERY: SHX581

## 2013-12-05 ENCOUNTER — Inpatient Hospital Stay (HOSPITAL_COMMUNITY)
Admission: EM | Admit: 2013-12-05 | Discharge: 2013-12-10 | DRG: 602 | Disposition: A | Discharge status: Home / Self Care | Payer: BC Managed Care – PPO | Attending: Internal Medicine | Admitting: Internal Medicine

## 2013-12-05 ENCOUNTER — Encounter (HOSPITAL_COMMUNITY): Payer: Self-pay | Admitting: Emergency Medicine

## 2013-12-05 ENCOUNTER — Emergency Department (HOSPITAL_COMMUNITY): Payer: BC Managed Care – PPO

## 2013-12-05 DIAGNOSIS — L0390 Cellulitis, unspecified: Secondary | ICD-10-CM

## 2013-12-05 DIAGNOSIS — R1110 Vomiting, unspecified: Secondary | ICD-10-CM

## 2013-12-05 DIAGNOSIS — A419 Sepsis, unspecified organism: Secondary | ICD-10-CM

## 2013-12-05 DIAGNOSIS — N179 Acute kidney failure, unspecified: Secondary | ICD-10-CM | POA: Diagnosis present

## 2013-12-05 DIAGNOSIS — K219 Gastro-esophageal reflux disease without esophagitis: Secondary | ICD-10-CM | POA: Diagnosis present

## 2013-12-05 DIAGNOSIS — L03119 Cellulitis of unspecified part of limb: Secondary | ICD-10-CM

## 2013-12-05 DIAGNOSIS — I890 Lymphedema, not elsewhere classified: Secondary | ICD-10-CM

## 2013-12-05 DIAGNOSIS — D509 Iron deficiency anemia, unspecified: Secondary | ICD-10-CM

## 2013-12-05 DIAGNOSIS — L0291 Cutaneous abscess, unspecified: Secondary | ICD-10-CM

## 2013-12-05 DIAGNOSIS — L03115 Cellulitis of right lower limb: Secondary | ICD-10-CM

## 2013-12-05 DIAGNOSIS — J45909 Unspecified asthma, uncomplicated: Secondary | ICD-10-CM | POA: Diagnosis present

## 2013-12-05 DIAGNOSIS — L02419 Cutaneous abscess of limb, unspecified: Principal | ICD-10-CM

## 2013-12-05 DIAGNOSIS — R609 Edema, unspecified: Secondary | ICD-10-CM

## 2013-12-05 DIAGNOSIS — G43909 Migraine, unspecified, not intractable, without status migrainosus: Secondary | ICD-10-CM

## 2013-12-05 DIAGNOSIS — R1115 Cyclical vomiting syndrome unrelated to migraine: Secondary | ICD-10-CM | POA: Diagnosis present

## 2013-12-05 LAB — COMPREHENSIVE METABOLIC PANEL
ALT: 5 U/L (ref 0–35)
AST: 25 U/L (ref 0–37)
Albumin: 3.1 g/dL — ABNORMAL LOW (ref 3.5–5.2)
Alkaline Phosphatase: 120 U/L — ABNORMAL HIGH (ref 39–117)
Anion gap: 15 (ref 5–15)
BILIRUBIN TOTAL: 0.6 mg/dL (ref 0.3–1.2)
BUN: 18 mg/dL (ref 6–23)
CALCIUM: 8.4 mg/dL (ref 8.4–10.5)
CHLORIDE: 99 meq/L (ref 96–112)
CO2: 22 mEq/L (ref 19–32)
Creatinine, Ser: 1.19 mg/dL — ABNORMAL HIGH (ref 0.50–1.10)
GFR calc Af Amer: 71 mL/min — ABNORMAL LOW (ref >90)
GFR calc non Af Amer: 61 mL/min — ABNORMAL LOW (ref >90)
Glucose, Bld: 71 mg/dL (ref 70–99)
Potassium: 3.6 mEq/L — ABNORMAL LOW (ref 3.7–5.3)
Sodium: 136 mEq/L — ABNORMAL LOW (ref 137–147)
Total Protein: 7.6 g/dL (ref 6.0–8.3)

## 2013-12-05 LAB — URINALYSIS, ROUTINE W REFLEX MICROSCOPIC
Bilirubin Urine: NEGATIVE
Bilirubin Urine: NEGATIVE
GLUCOSE, UA: NEGATIVE mg/dL
GLUCOSE, UA: NEGATIVE mg/dL
Ketones, ur: >80 mg/dL — AB
Ketones, ur: >80 mg/dL — AB
LEUKOCYTES UA: NEGATIVE
Leukocytes, UA: NEGATIVE
Nitrite: NEGATIVE
Nitrite: NEGATIVE
Protein, ur: 100 mg/dL — AB
Protein, ur: 100 mg/dL — AB
SPECIFIC GRAVITY, URINE: 1.027 (ref 1.005–1.030)
Specific Gravity, Urine: 1.027 (ref 1.005–1.030)
UROBILINOGEN UA: 1 mg/dL (ref 0.0–1.0)
Urobilinogen, UA: 1 mg/dL (ref 0.0–1.0)
pH: 6 (ref 5.0–8.0)
pH: 6 (ref 5.0–8.0)

## 2013-12-05 LAB — CBC WITH DIFFERENTIAL/PLATELET
BASOS ABS: 0 10*3/uL (ref 0.0–0.1)
BASOS PCT: 0 % (ref 0–1)
Eosinophils Absolute: 0 10*3/uL (ref 0.0–0.7)
Eosinophils Relative: 0 % (ref 0–5)
HCT: 34.5 % — ABNORMAL LOW (ref 36.0–46.0)
Hemoglobin: 11 g/dL — ABNORMAL LOW (ref 12.0–15.0)
Lymphocytes Relative: 11 % — ABNORMAL LOW (ref 12–46)
Lymphs Abs: 1.9 10*3/uL (ref 0.7–4.0)
MCH: 24.9 pg — AB (ref 26.0–34.0)
MCHC: 31.9 g/dL (ref 30.0–36.0)
MCV: 78.1 fL (ref 78.0–100.0)
MONO ABS: 0.8 10*3/uL (ref 0.1–1.0)
Monocytes Relative: 5 % (ref 3–12)
NEUTROS ABS: 15 10*3/uL — AB (ref 1.7–7.7)
Neutrophils Relative %: 84 % — ABNORMAL HIGH (ref 43–77)
Platelets: 238 10*3/uL (ref 150–400)
RBC: 4.42 MIL/uL (ref 3.87–5.11)
RDW: 14 % (ref 11.5–15.5)
WBC: 17.7 10*3/uL — ABNORMAL HIGH (ref 4.0–10.5)

## 2013-12-05 LAB — URINE MICROSCOPIC-ADD ON

## 2013-12-05 LAB — I-STAT CG4 LACTIC ACID, ED: LACTIC ACID, VENOUS: 1.67 mmol/L (ref 0.5–2.2)

## 2013-12-05 LAB — CK: Total CK: 621 U/L — ABNORMAL HIGH (ref 7–177)

## 2013-12-05 LAB — RETICULOCYTES
RBC.: 4.09 MIL/uL (ref 3.87–5.11)
RETIC CT PCT: 0.9 % (ref 0.4–3.1)
Retic Count, Absolute: 36.8 10*3/uL (ref 19.0–186.0)

## 2013-12-05 LAB — LIPASE, BLOOD: LIPASE: 13 U/L (ref 11–59)

## 2013-12-05 LAB — PREGNANCY, URINE: Preg Test, Ur: NEGATIVE

## 2013-12-05 MED ORDER — HYDROCODONE-ACETAMINOPHEN 5-325 MG PO TABS
1.0000 | ORAL_TABLET | ORAL | Status: DC | PRN
  Administered 2013-12-05 – 2013-12-06 (×3): 2 via ORAL
  Administered 2013-12-08: 1 via ORAL
  Filled 2013-12-05 (×3): qty 2
  Filled 2013-12-05: qty 1
  Filled 2013-12-05 (×2): qty 2

## 2013-12-05 MED ORDER — PIPERACILLIN-TAZOBACTAM 3.375 G IVPB 30 MIN
3.3750 g | Freq: Once | INTRAVENOUS | Status: AC
  Administered 2013-12-05: 3.375 g via INTRAVENOUS
  Filled 2013-12-05: qty 50

## 2013-12-05 MED ORDER — ACETAMINOPHEN 650 MG RE SUPP: 650.0000 mg | Freq: Four times a day (QID) | RECTAL | Status: DC | PRN

## 2013-12-05 MED ORDER — SODIUM CHLORIDE 0.9 % IV BOLUS (SEPSIS)
2000.0000 mL | Freq: Once | INTRAVENOUS | Status: AC
  Administered 2013-12-05: 2000 mL via INTRAVENOUS

## 2013-12-05 MED ORDER — SODIUM CHLORIDE 0.9 % IV SOLN
INTRAVENOUS | Status: DC
  Administered 2013-12-05 – 2013-12-09 (×8): via INTRAVENOUS
  Filled 2013-12-05 (×13): qty 1000

## 2013-12-05 MED ORDER — VANCOMYCIN HCL IN DEXTROSE 1-5 GM/200ML-% IV SOLN
1000.0000 mg | Freq: Once | INTRAVENOUS | Status: AC
  Administered 2013-12-05: 1000 mg via INTRAVENOUS
  Filled 2013-12-05: qty 200

## 2013-12-05 MED ORDER — INFLUENZA VAC SPLIT QUAD 0.5 ML IM SUSY
0.5000 mL | PREFILLED_SYRINGE | INTRAMUSCULAR | Status: AC
  Administered 2013-12-06: 0.5 mL via INTRAMUSCULAR
  Filled 2013-12-05 (×2): qty 0.5

## 2013-12-05 MED ORDER — ENOXAPARIN SODIUM 40 MG/0.4ML ~~LOC~~ SOLN
40.0000 mg | SUBCUTANEOUS | Status: DC
  Administered 2013-12-05 – 2013-12-07 (×3): 40 mg via SUBCUTANEOUS
  Filled 2013-12-05 (×5): qty 0.4

## 2013-12-05 MED ORDER — VANCOMYCIN HCL 10 G IV SOLR
1250.0000 mg | Freq: Two times a day (BID) | INTRAVENOUS | Status: DC
  Administered 2013-12-06 – 2013-12-07 (×4): 1250 mg via INTRAVENOUS
  Filled 2013-12-05 (×6): qty 1250

## 2013-12-05 MED ORDER — PIPERACILLIN-TAZOBACTAM 3.375 G IVPB
3.3750 g | Freq: Three times a day (TID) | INTRAVENOUS | Status: DC
  Administered 2013-12-05 – 2013-12-06 (×3): 3.375 g via INTRAVENOUS
  Filled 2013-12-05 (×4): qty 50

## 2013-12-05 MED ORDER — ONDANSETRON HCL 4 MG/2ML IJ SOLN
4.0000 mg | Freq: Four times a day (QID) | INTRAMUSCULAR | Status: DC | PRN
  Administered 2013-12-05 – 2013-12-08 (×4): 4 mg via INTRAVENOUS
  Filled 2013-12-05 (×4): qty 2

## 2013-12-05 MED ORDER — SODIUM CHLORIDE 0.9 % IJ SOLN
3.0000 mL | Freq: Two times a day (BID) | INTRAMUSCULAR | Status: DC
  Administered 2013-12-08 – 2013-12-09 (×2): 3 mL via INTRAVENOUS

## 2013-12-05 MED ORDER — ONDANSETRON HCL 4 MG PO TABS
4.0000 mg | ORAL_TABLET | Freq: Four times a day (QID) | ORAL | Status: DC | PRN
  Administered 2013-12-06: 4 mg via ORAL
  Filled 2013-12-05: qty 1

## 2013-12-05 MED ORDER — FENTANYL CITRATE 0.05 MG/ML IJ SOLN
50.0000 ug | Freq: Once | INTRAMUSCULAR | Status: AC
  Administered 2013-12-05: 50 ug via INTRAVENOUS
  Filled 2013-12-05: qty 2

## 2013-12-05 MED ORDER — VANCOMYCIN HCL 500 MG IV SOLR
500.0000 mg | Freq: Once | INTRAVENOUS | Status: AC
  Administered 2013-12-05: 500 mg via INTRAVENOUS
  Filled 2013-12-05: qty 500

## 2013-12-05 MED ORDER — ACETAMINOPHEN 325 MG PO TABS
650.0000 mg | ORAL_TABLET | Freq: Four times a day (QID) | ORAL | Status: DC | PRN
  Administered 2013-12-06: 650 mg via ORAL

## 2013-12-05 MED ORDER — ACETAMINOPHEN 325 MG PO TABS
650.0000 mg | ORAL_TABLET | Freq: Four times a day (QID) | ORAL | Status: DC | PRN
  Administered 2013-12-05: 650 mg via ORAL
  Filled 2013-12-05 (×2): qty 2

## 2013-12-05 NOTE — H&P (Signed)
History and Physical  Jacqueline Woodard OZH:[id number] DOB: Dec 14, 1985 DOA: 12/05/2013   PCP: No PCP Per Patient   Chief Complaint: leg pain, n/v  HPI:  28 year old female with a history of asthma and GERD presents with two-day history of fevers, nausea, vomiting, diarrhea. The patient states that she had temperature up to 104.30F at home. She had 2 episodes of emesis on the day of admission without any blood. She also had 4 loose stools on the day of admission without any hematochezia or melena. The patient denies any recent travels or recent antibiotics. She denies any undercooked or raw foods. In emergency department, the patient presented with suprapubic and epigastric abdominal pain. The patient was given fentanyl 50 mcg IV x1 with complete resolution of her abdominal pain. At the time of my interview and exam, the patient denied any abdominal discomfort whatsoever. In addition, the patient complains of right lower extremity pain, edema, erythema that began over 24 hours prior to admission. The patient has had a history of right lower extremity lymphedema in the past. She has not been on any antibiotics recently. She denies any recent injuries or trauma. She denies any headache, visual disturbance, chest pain, shortness breath, coughing, hemoptysis, synovitis, or other unusual rashes.  In the emergency department, the patient was noted to have fever 101.26F with tachycardia up to 138. The CBC was 17.7. BMP shows a creatinine of 1.19. Otherwise her electrolytes and hepatic enzymes were unremarkable. Lipase was 13. Xray of right lower extremity was negative except for soft tissue edema. Lactic Acid was 1.6. Vancomycin and Zosyn were ordered  Assessment/Plan: Sepsis -Secondary to the right lower extremity cellulitis -Present at the time of admission - Continue vancomycin IV -IV fluids -Blood cultures have been obtained -UA and urine culture Abdominal pain/nausea and vomiting -Abdominal  exam was completely benign without pain on my examination -Urine pregnancy test -Urinalysis and urine culture -C. difficile PCR Cellulitis right lower extremity -Venous duplex right lower extremity -Check CPK Microcytic anemia -Check iron studies/anemia panel GERD -continue PPI       Past Medical History  Diagnosis Date  . Migraine   . GERD (gastroesophageal reflux disease)   . History of gastric ulcer   . Lymphedema of leg     right - no current med.  . Macromastia 02/2013  . Asthma     no inhaler use in months   Past Surgical History  Procedure Laterality Date  . Wisdom tooth extraction    . Breast reduction surgery Bilateral 03/01/2013    Procedure: BILATERAL MAMMARY REDUCTION  (BREAST);  Surgeon: Louisa Second, MD;  Location: [facility];  Service: Plastics;  Laterality: Bilateral;   Social History:  reports that she has never smoked. She has never used smokeless tobacco. She reports that she does not drink alcohol or use illicit drugs.   Family History  Problem Relation Age of Onset  . Thyroid disease Mother      Allergies  Allergen Reactions  . Shellfish Allergy Swelling    SWELLING OF FACE AND THROAT      Prior to Admission medications   Medication Sig Start Date End Date Taking? Authorizing Provider  acetaminophen (TYLENOL) 500 MG tablet Take 1,000 mg by mouth every 6 (six) hours as needed for fever.   Yes Historical Provider, MD  esomeprazole (NEXIUM) 40 MG capsule Take 40 mg by mouth daily at 12 noon.   Yes Historical Provider, MD    Review of  Systems:  Constitutional:  No weight loss, night sweats,   Head&Eyes: No headache.  No vision loss.  No eye pain or scotoma ENT:  No Difficulty swallowing,Tooth/dental problems,Sore throat,  No ear ache, post nasal drip,  Cardio-vascular:  No chest pain, Orthopnea, PND,  dizziness, palpitations  GI:  No hematochezia, melena, heartburn, indigestion, Resp:  No shortness of breath  with exertion or at rest. No cough. No coughing up of blood .No wheezing.No chest wall deformity  Skin:  no rash or lesions.  GU:  no dysuria, change in color of urine, no urgency or frequency. No flank pain.  Musculoskeletal:   No decreased range of motion. No back pain.  Psych:  No change in mood or affect. No depression or anxiety. Neurologic: No headache, no dysesthesia, no focal weakness, no vision loss. No syncope  Physical Exam: Filed Vitals:   12/05/13 1417  BP: 100/81  Pulse: 138  Temp: 101.1 F (38.4 C)  TempSrc: Oral  Resp: 20  SpO2: 100%   General:  A&O x 3, NAD, nontoxic, pleasant/cooperative Head/Eye: No conjunctival hemorrhage, no icterus, [place]/AT, No nystagmus ENT:  No icterus,  No thrush, good dentition, no pharyngeal exudate Neck:  No masses, no lymphadenpathy, no bruits CV:  RRR, no rub, no gallop, no S3 Lung:  CTAB, good air movement, no wheeze, no rhonchi Abdomen: soft/NT, +BS, nondistended, no peritoneal signs Ext: No cyanosis, No rashes, No petechiae, 2+ RLE edema with erythema extending from the patient's dorsal foot and streaking up the pretibial area and calf daily popliteal fossa.   Labs on Admission:  Basic Metabolic Panel:  Recent Labs Lab 12/05/13 1457  NA 136*  K 3.6*  CL 99  CO2 22  GLUCOSE 71  BUN 18  CREATININE 1.19*  CALCIUM 8.4   Liver Function Tests:  Recent Labs Lab 12/05/13 1457  AST 25  ALT 5  ALKPHOS 120*  BILITOT 0.6  PROT 7.6  ALBUMIN 3.1*    Recent Labs Lab 12/05/13 1434  LIPASE 13   No results found for this basename: AMMONIA,  in the last 168 hours CBC:  Recent Labs Lab 12/05/13 1457  WBC 17.7*  NEUTROABS 15.0*  HGB 11.0*  HCT 34.5*  MCV 78.1  PLT 238   Cardiac Enzymes: No results found for this basename: CKTOTAL, CKMB, CKMBINDEX, TROPONINI,  in the last 168 hours BNP: No components found with this basename: POCBNP,  CBG: No results found for this basename: GLUCAP,  in the last 168  hours  Radiological Exams on Admission: Dg Tibia/fibula Right Port  12/05/2013   CLINICAL DATA:  Fever.  Diffuse swelling of the lower leg.  EXAM: PORTABLE RIGHT TIBIA AND FIBULA - 2 VIEW  COMPARISON:  10/11/2010  FINDINGS: Bones of the lower leg appear normal. There is nonspecific soft tissue edema diffusely. No radiopaque foreign object or other specific finding.  IMPRESSION: No bony abnormality.  Nonspecific diffuse soft tissue edema.   Electronically Signed   By: Paulina Fusi M.D.   On: 12/05/2013 15:38        Time spent:60 minutes Code Status:   FULL Family Communication:   Family updated at bedside   Ayeisha Lindenberger, DO  [facility] Pager [phone]  If 7PM-7AM, please contact night-coverage [email or web address] Password [facility] 12/05/2013, 4:27 PM

## 2013-12-05 NOTE — ED Notes (Signed)
Attempted to give report to [facility], however they currently have a rapid response situation, so they will call back once it's resolved.

## 2013-12-05 NOTE — Progress Notes (Signed)
ANTIBIOTIC CONSULT NOTE - INITIAL  Pharmacy Consult for Vancomycin and Zosyn Indication: Cellulitis, rule out sepsis  Allergies  Allergen Reactions  . Shellfish Allergy Swelling    SWELLING OF FACE AND THROAT    Patient Measurements: Height: 5\' 2"  (157.5 cm) Weight: 202 lb (91.627 kg) IBW/kg (Calculated) : 50.1  Vital Signs: Temp: 101.1 F (38.4 C) (09/20 1417) Temp src: Oral (09/20 1417) BP: 100/81 mmHg (09/20 1417) Pulse Rate: 109 (09/20 1530) Intake/Output from previous day:   Intake/Output from this shift:    Labs:  Recent Labs  12/05/13 1457  WBC 17.7*  HGB 11.0*  PLT 238  CREATININE 1.19*   Estimated Creatinine Clearance: 73.4 ml/min (by C-G formula based on Cr of 1.19). No results found for this basename: VANCOTROUGH, VANCOPEAK, VANCORANDOM, GENTTROUGH, GENTPEAK, GENTRANDOM, TOBRATROUGH, TOBRAPEAK, TOBRARND, AMIKACINPEAK, AMIKACINTROU, AMIKACIN,  in the last 72 hours   Microbiology: No results found for this or any previous visit (from the past 720 hour(s)).  Medical History: Past Medical History  Diagnosis Date  . Migraine   . GERD (gastroesophageal reflux disease)   . History of gastric ulcer   . Lymphedema of leg     right - no current med.  . Macromastia 02/2013  . Asthma     no inhaler use in months    Medications:   (Not in a hospital admission) Assessment: 27yo F w/ abdominal pain, N/V/D, fever, leukocytosis, and RLE cellulitis/pain. Hx lymphedema. Pharmacy is asked to dose Vanc and Zosyn for cellulitis and possible sepsis. First doses given in the ED.  9/20 >> Zosyn >> 9/20 >> Vanc >>    Tmax: 101.1 on admission WBCs: elevated Renal: SCr 1.2, CrCl 73CG  Goal of Therapy:  Vancomycin trough level 15-20 mcg/ml Appropriate antibiotic dosing for renal function; eradication of infection  Plan:   Vancomycin 1g + 500mg  IV in the ED then start 1250mg  IV q12h. Zosyn 3.375g IV Q8H infused over 4hrs.  Measure Vanc trough at steady  state.  Follow up renal fxn and culture results.  Romeo Rabon, PharmD, pager [phone]. 12/05/2013,4:52 PM.

## 2013-12-05 NOTE — ED Notes (Signed)
Pt. Aware of giving a urine sample. Will collect when pt. urinates. Nurse was notified.

## 2013-12-05 NOTE — ED Provider Notes (Signed)
CSN: [id number]     Arrival date & time 12/05/13  1405 History   First MD Initiated Contact with Patient 12/05/13 1427     Chief Complaint  Patient presents with  . Abdominal Pain  . Leg Pain  . Fever  . Emesis  . Diarrhea     (Consider location/radiation/quality/duration/timing/severity/associated sxs/prior Treatment) HPI 28 year old female presents with 3 days of abdominal pain, nausea, vomiting, diarrhea, and fever. She also has increased right leg pain and swelling. She typically has swelling in her right leg due to lymphedema but states it feels a little more swollen and it feels like someone is creeping her bone. Her highest temperature was 104.8. The patient states most of her bowel pain is epigastric/left upper quadrant and suprapubic. Denies any cough, shortness of breath, or dysuria. The pain is currently severe. She has had similar symptoms when she had a "leg infection" in the right leg in the past.  Past Medical History  Diagnosis Date  . Migraine   . GERD (gastroesophageal reflux disease)   . History of gastric ulcer   . Lymphedema of leg     right - no current med.  . Macromastia 02/2013  . Asthma     no inhaler use in months   Past Surgical History  Procedure Laterality Date  . Wisdom tooth extraction    . Breast reduction surgery Bilateral 03/01/2013    Procedure: BILATERAL MAMMARY REDUCTION  (BREAST);  Surgeon: Cristine Polio, MD;  Location: [place];  Service: Plastics;  Laterality: Bilateral;   Family History  Problem Relation Age of Onset  . Thyroid disease Mother    History  Substance Use Topics  . Smoking status: Never Smoker   . Smokeless tobacco: Never Used  . Alcohol Use: No   OB History   Grav Para Term Preterm Abortions TAB SAB Ect Mult Living                 Review of Systems  Constitutional: Positive for fever and chills.  Respiratory: Negative for cough and shortness of breath.   Cardiovascular: Positive for leg  swelling. Negative for chest pain.  Gastrointestinal: Positive for nausea, vomiting, abdominal pain and diarrhea.  Genitourinary: Negative for dysuria.  Musculoskeletal: Negative for back pain.  Neurological: Positive for headaches.  All other systems reviewed and are negative.     Allergies  Shellfish allergy  Home Medications   Prior to Admission medications   Medication Sig Start Date End Date Taking? Authorizing Provider  albuterol (PROVENTIL,VENTOLIN) 90 MCG/ACT inhaler Inhale 2 puffs into the lungs once as needed. For asthma     Historical Provider, MD  esomeprazole (NEXIUM) 40 MG capsule Take 40 mg by mouth daily at 12 noon.    Historical Provider, MD  HYDROcodone-acetaminophen (NORCO/VICODIN) 5-325 MG per tablet Take 2 tablets by mouth every 4 (four) hours as needed. 03/13/13   Johnna Acosta, MD  sulfamethoxazole-trimethoprim (SEPTRA DS) 800-160 MG per tablet Take 1 tablet by mouth every 12 (twelve) hours. 03/13/13   Johnna Acosta, MD   BP 100/81  Pulse 138  Temp(Src) 101.1 F (38.4 C) (Oral)  Resp 20  SpO2 100%  LMP 11/22/2013 Physical Exam  Nursing note and vitals reviewed. Constitutional: She is oriented to person, place, and time. She appears well-developed and well-nourished.  HENT:  Head: Normocephalic and atraumatic.  Right Ear: External ear normal.  Left Ear: External ear normal.  Nose: Nose normal.  Eyes: Right eye exhibits no discharge.  Left eye exhibits no discharge.  Cardiovascular: Normal rate, regular rhythm and normal heart sounds.   Pulmonary/Chest: Effort normal and breath sounds normal. She has no wheezes. She has no rales.  Abdominal: Soft. There is tenderness in the suprapubic area and left upper quadrant.  Musculoskeletal:       Right lower leg: She exhibits tenderness and swelling.       Right foot: She exhibits tenderness and swelling.  Diffuse tenderness and symmetric swelling to right lower leg and foot. Normal sensation. Erythema and  warmth without abscess. No drainage. Soft. No crepitus  Neurological: She is alert and oriented to person, place, and time.  Skin: Skin is warm and dry.    ED Course  Procedures (including critical care time) Labs Review Labs Reviewed  CBC WITH DIFFERENTIAL - Abnormal; Notable for the following:    WBC 17.7 (*)    Hemoglobin 11.0 (*)    HCT 34.5 (*)    MCH 24.9 (*)    Neutrophils Relative % 84 (*)    Neutro Abs 15.0 (*)    Lymphocytes Relative 11 (*)    All other components within normal limits  COMPREHENSIVE METABOLIC PANEL - Abnormal; Notable for the following:    Sodium 136 (*)    Potassium 3.6 (*)    Creatinine, Ser 1.19 (*)    Albumin 3.1 (*)    Alkaline Phosphatase 120 (*)    GFR calc non Af Amer 61 (*)    GFR calc Af Amer 71 (*)    All other components within normal limits  CULTURE, BLOOD (ROUTINE X 2)  CULTURE, BLOOD (ROUTINE X 2)  LIPASE, BLOOD  URINALYSIS, ROUTINE W REFLEX MICROSCOPIC  I-STAT CG4 LACTIC ACID, ED    Imaging Review Dg Tibia/fibula Right Port  12/05/2013   CLINICAL DATA:  Fever.  Diffuse swelling of the lower leg.  EXAM: PORTABLE RIGHT TIBIA AND FIBULA - 2 VIEW  COMPARISON:  10/11/2010  FINDINGS: Bones of the lower leg appear normal. There is nonspecific soft tissue edema diffusely. No radiopaque foreign object or other specific finding.  IMPRESSION: No bony abnormality.  Nonspecific diffuse soft tissue edema.   Electronically Signed   By: Nelson Chimes M.D.   On: 12/05/2013 15:38     EKG Interpretation None      MDM   Final diagnoses:  Sepsis, due to unspecified organism  Cellulitis of right lower extremity    Her abdominal exam is benign with mild tenderness. Given her apparent cellulitis to RLE with chronic lymphadema, I think this is the source of her fever. With the elevated WBC and fever she meets criteria for sepsis, will treat with IV abx and admit to medicine.  Given that one dose of IV pain meds relieved her pain, I have low  suspicion this is a more intensive disease such as necrotizing fascitis.     Ephraim Hamburger, MD 12/05/13 [phone]

## 2013-12-05 NOTE — ED Notes (Signed)
Pt states that she has been having gen abd pain, NVD, and rt leg pain x 3 days.  Pt states that her rt leg feels like someone is scraping the bone.  Pt states that she has had fevers as high as 104.8.  Has been taking tylenol but the lowest she has seen it was 102.2.  Pt febrile at 101.1 now w/ HR of 138.

## 2013-12-06 DIAGNOSIS — R609 Edema, unspecified: Secondary | ICD-10-CM

## 2013-12-06 LAB — URINE CULTURE

## 2013-12-06 LAB — CBC
HCT: 29.4 % — ABNORMAL LOW (ref 36.0–46.0)
HEMOGLOBIN: 9.3 g/dL — AB (ref 12.0–15.0)
MCH: 25.4 pg — ABNORMAL LOW (ref 26.0–34.0)
MCHC: 31.6 g/dL (ref 30.0–36.0)
MCV: 80.3 fL (ref 78.0–100.0)
Platelets: 208 10*3/uL (ref 150–400)
RBC: 3.66 MIL/uL — AB (ref 3.87–5.11)
RDW: 14.3 % (ref 11.5–15.5)
WBC: 11.7 10*3/uL — AB (ref 4.0–10.5)

## 2013-12-06 LAB — IRON AND TIBC
Iron: <10 ug/dL — ABNORMAL LOW (ref 42–135)
UIBC: 204 ug/dL (ref 125–400)

## 2013-12-06 LAB — BASIC METABOLIC PANEL
Anion gap: 12 (ref 5–15)
BUN: 12 mg/dL (ref 6–23)
CO2: 20 meq/L (ref 19–32)
Calcium: 7.8 mg/dL — ABNORMAL LOW (ref 8.4–10.5)
Chloride: 105 mEq/L (ref 96–112)
Creatinine, Ser: 0.99 mg/dL (ref 0.50–1.10)
GFR calc Af Amer: 88 mL/min — ABNORMAL LOW (ref >90)
GFR, EST NON AFRICAN AMERICAN: 76 mL/min — AB (ref >90)
GLUCOSE: 88 mg/dL (ref 70–99)
POTASSIUM: 4.2 meq/L (ref 3.7–5.3)
Sodium: 137 mEq/L (ref 137–147)

## 2013-12-06 LAB — FOLATE RBC: RBC FOLATE: 967 ng/mL — AB (ref >280)

## 2013-12-06 LAB — FERRITIN: Ferritin: 160 ng/mL (ref 10–291)

## 2013-12-06 LAB — VITAMIN B12: Vitamin B-12: 852 pg/mL (ref 211–911)

## 2013-12-06 MED ORDER — ESOMEPRAZOLE MAGNESIUM 40 MG PO CPDR
40.0000 mg | DELAYED_RELEASE_CAPSULE | Freq: Every day | ORAL | Status: DC
  Administered 2013-12-06 – 2013-12-09 (×4): 40 mg via ORAL
  Filled 2013-12-06 (×5): qty 1

## 2013-12-06 MED ORDER — NON FORMULARY: 40.0000 mg | Freq: Every day | Status: DC

## 2013-12-06 NOTE — Progress Notes (Signed)
[facility] PROGRESS NOTE   Jacqueline Woodard DXI:[id number] DOB: [birth year] DOA: 12/05/2013 PCP: No PCP Per Patient  HPI/Subjective: Feels much better, less pain and soreness in the right lower extremity.  Assessment/Plan: Active Problems:   Lymphedema   Sepsis   Cellulitis, leg   Recurrent vomiting   Microcytic anemia    Sepsis  -Secondary to the right lower extremity cellulitis  -Present at the time of admission  -Continue vancomycin IV and IV fluids. -She is on Zosyn, despite the fact that she had a history of gram-negative skin infection there is no evidence of abscess or ulceration, I will discontinue the Zosyn. -Blood cultures have been obtained   Abdominal pain/nausea and vomiting  -Abdominal exam was completely benign without pain on my examination  -Urine pregnancy test is negative -Urinalysis without pyuria. -C. difficile PCR pending.   Cellulitis right lower extremity  -Venous duplex right lower extremity  -CPK elevated likely secondary to right lower extremity. -There is some edema, patient said she had history of right lymphedema, pulses palpable distally.  Microcytic anemia  -Check iron studies/anemia panel   GERD  -continue PPI   Code Status: Full code Family Communication: Plan discussed with the patient. Disposition Plan: Remains inpatient   Consultants:  None  Procedures:  None  Antibiotics:  Vancomycin 9/20>>>  Zosyn 9/20>>> 9/21   Objective: Filed Vitals:   12/06/13 0404  BP: 98/56  Pulse: 97  Temp: 100.8 F (38.2 C)  Resp: 20    Intake/Output Summary (Last 24 hours) at 12/06/13 1413 Last data filed at 12/06/13 0524  Gross per 24 hour  Intake    350 ml  Output      0 ml  Net    350 ml   Filed Weights   12/05/13 1638  Weight: 91.627 kg (202 lb)    Exam: General: Alert and awake, oriented x3, not in any acute distress. HEENT: anicteric sclera, pupils reactive to light and accommodation, EOMI CVS: S1-S2  clear, no murmur rubs or gallops Chest: clear to auscultation bilaterally, no wheezing, rales or rhonchi Abdomen: soft nontender, nondistended, normal bowel sounds, no organomegaly Extremities: no cyanosis, clubbing or edema noted bilaterally Neuro: Cranial nerves II-XII intact, no focal neurological deficits  Data Reviewed: Basic Metabolic Panel:  Recent Labs Lab 12/05/13 1457 12/06/13 0436  NA 136* 137  K 3.6* 4.2  CL 99 105  CO2 22 20  GLUCOSE 71 88  BUN 18 12  CREATININE 1.19* 0.99  CALCIUM 8.4 7.8*   Liver Function Tests:  Recent Labs Lab 12/05/13 1457  AST 25  ALT 5  ALKPHOS 120*  BILITOT 0.6  PROT 7.6  ALBUMIN 3.1*    Recent Labs Lab 12/05/13 1434  LIPASE 13   No results found for this basename: AMMONIA,  in the last 168 hours CBC:  Recent Labs Lab 12/05/13 1457 12/06/13 0540  WBC 17.7* 11.7*  NEUTROABS 15.0*  --   HGB 11.0* 9.3*  HCT 34.5* 29.4*  MCV 78.1 80.3  PLT 238 208   Cardiac Enzymes:  Recent Labs Lab 12/05/13 2020  CKTOTAL 621*   BNP (last 3 results) No results found for this basename: PROBNP,  in the last 8760 hours CBG: No results found for this basename: GLUCAP,  in the last 168 hours  Micro Recent Results (from the past 240 hour(s))  CULTURE, BLOOD (ROUTINE X 2)     Status: None   Collection Time    12/05/13  2:57 PM  Result Value Ref Range Status   Specimen Description BLOOD RIGHT ANTECUBITAL   Final   Special Requests BOTTLES DRAWN AEROBIC AND ANAEROBIC 2ML   Final   Culture  Setup Time     Final   Value: 12/05/2013 18:15     Performed at [facility]   Culture     Final   Value:        BLOOD CULTURE RECEIVED NO GROWTH TO DATE CULTURE WILL BE HELD FOR 5 DAYS BEFORE ISSUING A FINAL NEGATIVE REPORT     Performed at [facility]   Report Status PENDING   Incomplete     Studies: Dg Tibia/fibula Right Port  12/05/2013   CLINICAL DATA:  Fever.  Diffuse swelling of the lower leg.  EXAM: PORTABLE  RIGHT TIBIA AND FIBULA - 2 VIEW  COMPARISON:  10/11/2010  FINDINGS: Bones of the lower leg appear normal. There is nonspecific soft tissue edema diffusely. No radiopaque foreign object or other specific finding.  IMPRESSION: No bony abnormality.  Nonspecific diffuse soft tissue edema.   Electronically Signed   By: Nelson Chimes M.D.   On: 12/05/2013 15:38    Scheduled Meds: . enoxaparin (LOVENOX) injection  40 mg Subcutaneous Q24H  . piperacillin-tazobactam (ZOSYN)  IV  3.375 g Intravenous 3 times per day  . sodium chloride  3 mL Intravenous Q12H  . vancomycin  1,250 mg Intravenous Q12H   Continuous Infusions: . sodium chloride 0.9 % 1,000 mL with potassium chloride 20 mEq infusion 100 mL/hr at 12/06/13 1251       Time spent: 35 minutes    [facility] A  [facility] Pager [phone] If 7PM-7AM, please contact night-coverage at [email or web address], password [facility] 12/06/2013, 2:13 PM  LOS: 1 day

## 2013-12-06 NOTE — Care Management Note (Addendum)
    Page 1 of 1   12/10/2013     1:58:13 PM CARE MANAGEMENT NOTE 12/10/2013  Patient:  Jacqueline Woodard, Jacqueline Woodard   Account Number:  [id number]  Date Initiated:  12/06/2013  Documentation initiated by:  Dessa Phi  Subjective/Objective Assessment:   28 Y/O F ADMITTED W/SEPSIS,RLE CELLULITIS.     Action/Plan:   FROM HOME.WORKS-SHEETZ.   Anticipated DC Date:  12/10/2013   Anticipated DC Plan:  [place]  CM consult  [facility]      Choice offered to / List presented to:             Status of service:  Completed, signed off Medicare Important Message given?  YES (If response is "NO", the following Medicare IM given date fields will be blank) Date Medicare IM given:  12/08/2013 Medicare IM given by:  [facility] Date Additional Medicare IM given:   Additional Medicare IM given by:    Discharge Disposition:  HOME/SELF CARE  Per UR Regulation:  Reviewed for med. necessity/level of care/duration of stay  If discussed at Branch of Stay Meetings, dates discussed:    Comments:  12/10/13 Jessyca Sloan RN,BSN NCM [address] [place] PCP APPT.PATIENT VOICED UNDERSTANDING.NO FURTHER D/C NEEDS.  12/08/13 Filomena Pokorney RN,BSN NCM [phone] 2:30P-PER ADMIT-CURRENTLY INSURANCE IS NOT ACTIVE W/BCBS-SPOKE TO [place] IN ADMITTING.ASKED PATIENT IF SHE HAD INSURANCE CARD TO BRING IT IN,OTHERWISE SHE WILL GET BILLED.PATIENT VOICED UNDERSTANDING.PCP APPT TO SET UP @ [facility]-PATIENT TO CALL OR WALK IN FOR PCP APPT.PATIENT VOICED UNDERSTANDING.PROVIDED W/$4 [facility] MED LIST.  RLE CELLULITIS,TEMP.IV ABX.C DIFF NEG.NO ANTICIPATED D/C NEEDS.  12/06/13 Alima Naser RN,BSN NCM [phone] NO ANTICIPATED D/C NEEDS.

## 2013-12-06 NOTE — Progress Notes (Signed)
VASCULAR LAB PRELIMINARY  PRELIMINARY  PRELIMINARY  PRELIMINARY  Right lower extremity venous duplex completed.    Preliminary report:  Right:  No evidence of DVT, superficial thrombosis, or Baker's cyst.   Ferol Laiche, RVT 12/06/2013, 9:29 AM

## 2013-12-07 DIAGNOSIS — G43909 Migraine, unspecified, not intractable, without status migrainosus: Secondary | ICD-10-CM

## 2013-12-07 DIAGNOSIS — L0390 Cellulitis, unspecified: Secondary | ICD-10-CM

## 2013-12-07 DIAGNOSIS — L0291 Cutaneous abscess, unspecified: Secondary | ICD-10-CM

## 2013-12-07 LAB — CLOSTRIDIUM DIFFICILE BY PCR: CDIFFPCR: NEGATIVE

## 2013-12-07 LAB — BASIC METABOLIC PANEL
ANION GAP: 9 (ref 5–15)
BUN: 7 mg/dL (ref 6–23)
CALCIUM: 7.9 mg/dL — AB (ref 8.4–10.5)
CO2: 22 meq/L (ref 19–32)
Chloride: 108 mEq/L (ref 96–112)
Creatinine, Ser: 1.3 mg/dL — ABNORMAL HIGH (ref 0.50–1.10)
GFR calc Af Amer: 64 mL/min — ABNORMAL LOW (ref >90)
GFR calc non Af Amer: 55 mL/min — ABNORMAL LOW (ref >90)
Glucose, Bld: 88 mg/dL (ref 70–99)
Potassium: 3.9 mEq/L (ref 3.7–5.3)
SODIUM: 139 meq/L (ref 137–147)

## 2013-12-07 LAB — CBC
HEMATOCRIT: 30.8 % — AB (ref 36.0–46.0)
Hemoglobin: 9.7 g/dL — ABNORMAL LOW (ref 12.0–15.0)
MCH: 25.1 pg — ABNORMAL LOW (ref 26.0–34.0)
MCHC: 31.5 g/dL (ref 30.0–36.0)
MCV: 79.6 fL (ref 78.0–100.0)
Platelets: 221 10*3/uL (ref 150–400)
RBC: 3.87 MIL/uL (ref 3.87–5.11)
RDW: 14.4 % (ref 11.5–15.5)
WBC: 9.2 10*3/uL (ref 4.0–10.5)

## 2013-12-07 LAB — VANCOMYCIN, TROUGH: Vancomycin Tr: 19.2 ug/mL (ref 10.0–20.0)

## 2013-12-07 MED ORDER — PROMETHAZINE HCL 25 MG/ML IJ SOLN: 12.5000 mg | Freq: Once | INTRAMUSCULAR | Status: DC

## 2013-12-07 MED ORDER — BISACODYL 10 MG RE SUPP: 10.0000 mg | Freq: Once | RECTAL | Status: DC

## 2013-12-07 NOTE — Progress Notes (Signed)
[facility] PROGRESS NOTE   Jacqueline BEAUCHAMP NID:[id number] DOB: [birth year] DOA: 12/05/2013 PCP: No PCP Per Patient  HPI/Subjective: Had fever of 102.2 last night, creatinine increased to 1.3.  Assessment/Plan: Active Problems:   Lymphedema   Sepsis   Cellulitis, leg   Recurrent vomiting   Microcytic anemia    Sepsis  -Secondary to the right lower extremity cellulitis  -Present at the time of admission  -Continue vancomycin IV and IV fluids. -She is on Zosyn, despite the fact that she had a history of gram-negative skin infection there is no evidence of abscess or ulceration, I will discontinue the Zosyn. -Blood cultures obtained NGTD   Cellulitis right lower extremity  -Venous duplex right lower extremity showed no evidence of DVT  -CPK elevated likely secondary to right lower extremity cellulitis. -There is some edema, patient said she had history of right lymphedema, pulses palpable distally. -Still warm to touch, very tender, continue vancomycin.  Abdominal pain/nausea and vomiting  -Abdominal exam was completely benign without pain on my examination  -Urine pregnancy test is negative -Urinalysis without pyuria. -C. difficile PCR pending.  Acute kidney injury -Baseline creatinine was 0.9, creatinine increased to 1.3 earlier today. -Increase IV fluids rate.  Microcytic anemia  -Check iron studies/anemia panel   GERD  -continue PPI   Code Status: Full code Family Communication: Plan discussed with the patient. Disposition Plan: Remains inpatient   Consultants:  None  Procedures:  None  Antibiotics:  Vancomycin 9/20>>>  Zosyn 9/20>>> 9/21   Objective: Filed Vitals:   12/07/13 0300  BP: 107/76  Pulse: 91  Temp: 99 F (37.2 C)  Resp: 20    Intake/Output Summary (Last 24 hours) at 12/07/13 1429 Last data filed at 12/07/13 0600  Gross per 24 hour  Intake   2900 ml  Output   1050 ml  Net   1850 ml   Filed Weights   12/05/13  1638  Weight: 91.627 kg (202 lb)    Exam: General: Alert and awake, oriented x3, not in any acute distress. HEENT: anicteric sclera, pupils reactive to light and accommodation, EOMI CVS: S1-S2 clear, no murmur rubs or gallops Chest: clear to auscultation bilaterally, no wheezing, rales or rhonchi Abdomen: soft nontender, nondistended, normal bowel sounds, no organomegaly Extremities: no cyanosis, clubbing or edema noted bilaterally Neuro: Cranial nerves II-XII intact, no focal neurological deficits  Data Reviewed: Basic Metabolic Panel:  Recent Labs Lab 12/05/13 1457 12/06/13 0436 12/07/13 0410  NA 136* 137 139  K 3.6* 4.2 3.9  CL 99 105 108  CO2 22 20 22   GLUCOSE 71 88 88  BUN 18 12 7   CREATININE 1.19* 0.99 1.30*  CALCIUM 8.4 7.8* 7.9*   Liver Function Tests:  Recent Labs Lab 12/05/13 1457  AST 25  ALT 5  ALKPHOS 120*  BILITOT 0.6  PROT 7.6  ALBUMIN 3.1*    Recent Labs Lab 12/05/13 1434  LIPASE 13   No results found for this basename: AMMONIA,  in the last 168 hours CBC:  Recent Labs Lab 12/05/13 1457 12/06/13 0540 12/07/13 0410  WBC 17.7* 11.7* 9.2  NEUTROABS 15.0*  --   --   HGB 11.0* 9.3* 9.7*  HCT 34.5* 29.4* 30.8*  MCV 78.1 80.3 79.6  PLT 238 208 221   Cardiac Enzymes:  Recent Labs Lab 12/05/13 2020  CKTOTAL 621*   BNP (last 3 results) No results found for this basename: PROBNP,  in the last 8760 hours CBG: No results found for this  basename: GLUCAP,  in the last 168 hours  Micro Recent Results (from the past 240 hour(s))  CULTURE, BLOOD (ROUTINE X 2)     Status: None   Collection Time    12/05/13  2:57 PM      Result Value Ref Range Status   Specimen Description BLOOD RIGHT ANTECUBITAL   Final   Special Requests BOTTLES DRAWN AEROBIC AND ANAEROBIC 2ML   Final   Culture  Setup Time     Final   Value: 12/05/2013 18:15     Performed at [facility]   Culture     Final   Value:        BLOOD CULTURE RECEIVED NO GROWTH TO  DATE CULTURE WILL BE HELD FOR 5 DAYS BEFORE ISSUING A FINAL NEGATIVE REPORT     Performed at [facility]   Report Status PENDING   Incomplete  URINE CULTURE     Status: None   Collection Time    12/05/13  6:24 PM      Result Value Ref Range Status   Specimen Description URINE, CLEAN CATCH   Final   Special Requests NONE   Final   Culture  Setup Time     Final   Value: 12/06/2013 01:57     Performed at [place]     Final   Value: 75,000 COLONIES/ML     Performed at [facility]   Culture     Final   Value: Multiple bacterial morphotypes present, none predominant. Suggest appropriate recollection if clinically indicated.     Performed at [facility]   Report Status 12/06/2013 FINAL   Final  CULTURE, BLOOD (ROUTINE X 2)     Status: None   Collection Time    12/05/13 11:08 PM      Result Value Ref Range Status   Specimen Description BLOOD LEFT ARM   Final   Special Requests BOTTLES DRAWN AEROBIC ONLY 8CC   Final   Culture  Setup Time     Final   Value: 12/06/2013 09:41     Performed at [facility]   Culture     Final   Value:        BLOOD CULTURE RECEIVED NO GROWTH TO DATE CULTURE WILL BE HELD FOR 5 DAYS BEFORE ISSUING A FINAL NEGATIVE REPORT     Performed at [facility]   Report Status PENDING   Incomplete  CLOSTRIDIUM DIFFICILE BY PCR     Status: None   Collection Time    12/07/13 10:24 AM      Result Value Ref Range Status   C difficile by pcr NEGATIVE  NEGATIVE Final   Comment: Performed at [facility]     Studies: Dg Tibia/fibula Right Port  12/05/2013   CLINICAL DATA:  Fever.  Diffuse swelling of the lower leg.  EXAM: PORTABLE RIGHT TIBIA AND FIBULA - 2 VIEW  COMPARISON:  10/11/2010  FINDINGS: Bones of the lower leg appear normal. There is nonspecific soft tissue edema diffusely. No radiopaque foreign object or other specific finding.  IMPRESSION: No bony abnormality.  Nonspecific diffuse soft  tissue edema.   Electronically Signed   By: Nelson Chimes M.D.   On: 12/05/2013 15:38    Scheduled Meds: . bisacodyl  10 mg Rectal Once  . enoxaparin (LOVENOX) injection  40 mg Subcutaneous Q24H  . esomeprazole  40 mg Oral Q1200  . sodium chloride  3  mL Intravenous Q12H  . vancomycin  1,250 mg Intravenous Q12H   Continuous Infusions: . sodium chloride 0.9 % 1,000 mL with potassium chloride 20 mEq infusion 100 mL/hr at 12/07/13 1240       Time spent: 35 minutes    [facility]  [facility] Pager [phone] If 7PM-7AM, please contact night-coverage at [email or web address], password [facility] 12/07/2013, 2:29 PM  LOS: 2 days

## 2013-12-07 NOTE — Progress Notes (Signed)
ANTIBIOTIC CONSULT NOTE - Follow-up  Pharmacy Consult for Vancomycin Indication: Cellulitis, rule out sepsis  Allergies  Allergen Reactions  . Shellfish Allergy Swelling    SWELLING OF FACE AND THROAT    Patient Measurements: Height: 5\' 2"  (157.5 cm) Weight: 202 lb (91.627 kg) IBW/kg (Calculated) : 50.1  Vital Signs: Temp: 99 F (37.2 C) (09/22 0300) Temp src: Oral (09/22 0300) BP: 107/76 mmHg (09/22 0300) Pulse Rate: 91 (09/22 0300) Intake/Output from previous day: 09/21 0701 - 09/22 0700 In: 2960 [P.O.:180; I.V.:2280; IV Piggyback:500] Out: 1050 [Urine:1050] Intake/Output from this shift:    Labs:  Recent Labs  12/05/13 1457 12/06/13 0436 12/06/13 0540 12/07/13 0410  WBC 17.7*  --  11.7* 9.2  HGB 11.0*  --  9.3* 9.7*  PLT 238  --  208 221  CREATININE 1.19* 0.99  --  1.30*   Estimated Creatinine Clearance: 67.2 ml/min (by C-G formula based on Cr of 1.3). No results found for this basename: VANCOTROUGH, Corlis Leak, VANCORANDOM, [place], [place], GENTRANDOM, TOBRATROUGH, TOBRAPEAK, TOBRARND, AMIKACINPEAK, AMIKACINTROU, AMIKACIN,  in the last 72 hours   Microbiology: Recent Results (from the past 720 hour(s))  CULTURE, BLOOD (ROUTINE X 2)     Status: None   Collection Time    12/05/13  2:57 PM      Result Value Ref Range Status   Specimen Description BLOOD RIGHT ANTECUBITAL   Final   Special Requests BOTTLES DRAWN AEROBIC AND ANAEROBIC 2ML   Final   Culture  Setup Time     Final   Value: 12/05/2013 18:15     Performed at [facility]   Culture     Final   Value:        BLOOD CULTURE RECEIVED NO GROWTH TO DATE CULTURE WILL BE HELD FOR 5 DAYS BEFORE ISSUING A FINAL NEGATIVE REPORT     Performed at [facility]   Report Status PENDING   Incomplete  URINE CULTURE     Status: None   Collection Time    12/05/13  6:24 PM      Result Value Ref Range Status   Specimen Description URINE, CLEAN CATCH   Final   Special Requests NONE   Final   Culture  Setup Time     Final   Value: 12/06/2013 01:57     Performed at [place]     Final   Value: 75,000 COLONIES/ML     Performed at [facility]   Culture     Final   Value: Multiple bacterial morphotypes present, none predominant. Suggest appropriate recollection if clinically indicated.     Performed at [facility]   Report Status 12/06/2013 FINAL   Final    Medical History: Past Medical History  Diagnosis Date  . Migraine   . GERD (gastroesophageal reflux disease)   . History of gastric ulcer   . Lymphedema of leg     right - no current med.  . Macromastia 02/2013  . Asthma     no inhaler use in months    Medications:  Prescriptions prior to admission  Medication Sig Dispense Refill  . acetaminophen (TYLENOL) 500 MG tablet Take 1,000 mg by mouth every 6 (six) hours as needed for fever.      . esomeprazole (NEXIUM) 40 MG capsule Take 40 mg by mouth daily at 12 noon.       Assessment: 28yo F w/ abdominal pain, N/V/D, fever, leukocytosis, and RLE cellulitis/pain.  Hx lymphedema. Pharmacy consulted to dose Vancomycin and Zosyn on admission for cellulitis and possible sepsis. First doses given in the ED.  9/20 >> Zosyn >> 9/21 9/20 >> Vanc >>    Tmax: 102.2 F WBCs: WNL, trending down Renal: SCr 1.3 (trending up), CrCl 72 mL/min N, UOP 1050 mL  9/20 blood: NGTD 9/20 urine: NGF   Goal of Therapy:  Vancomycin trough level 15-20 mcg/ml Appropriate antibiotic dosing for renal function; eradication of infection  Plan:   Check Vancomycin trough level prior to 1800 dose tonight and adjust if indicated.  Continue to follow renal function, cultures, clinical course.   Lindell Spar, PharmD, BCPS Pager: [phone] 12/07/2013 8:26 AM

## 2013-12-07 NOTE — Progress Notes (Signed)
PHARMACY - VANCOMYCIN (brief note)  Day #3 Vancomycin for sepsis due to cellulitis  Scr increased from 0.9 to 1.3 Tmax = 102.85F  Vancomycin trough level =- 19.2 mcg/ml on regimen of Vancomycin 1250 mg IV q12h  Assessment: Vancomycin trough therapeutic (Goal 15-20 mcg/ml)  Plan:  Continue Vancomycin 1250mg  IV q12h            Watch AM Scr - may need to adjust regimen if Scr continues to rise  Leone Haven, PharmD 12/07/13 @ 18:14

## 2013-12-08 LAB — BASIC METABOLIC PANEL
Anion gap: 14 (ref 5–15)
BUN: 13 mg/dL (ref 6–23)
CO2: 18 mEq/L — ABNORMAL LOW (ref 19–32)
Calcium: 8.2 mg/dL — ABNORMAL LOW (ref 8.4–10.5)
Chloride: 107 mEq/L (ref 96–112)
Creatinine, Ser: 1.78 mg/dL — ABNORMAL HIGH (ref 0.50–1.10)
GFR calc Af Amer: 43 mL/min — ABNORMAL LOW (ref >90)
GFR, EST NON AFRICAN AMERICAN: 38 mL/min — AB (ref >90)
Glucose, Bld: 77 mg/dL (ref 70–99)
Potassium: 3.9 mEq/L (ref 3.7–5.3)
SODIUM: 139 meq/L (ref 137–147)

## 2013-12-08 LAB — TROPONIN I

## 2013-12-08 MED ORDER — CLINDAMYCIN PHOSPHATE 600 MG/50ML IV SOLN
600.0000 mg | Freq: Three times a day (TID) | INTRAVENOUS | Status: DC
  Administered 2013-12-08 – 2013-12-09 (×3): 600 mg via INTRAVENOUS
  Filled 2013-12-08 (×4): qty 50

## 2013-12-08 MED ORDER — FERROUS SULFATE 325 (65 FE) MG PO TABS
325.0000 mg | ORAL_TABLET | Freq: Every day | ORAL | Status: DC
  Administered 2013-12-09 – 2013-12-10 (×2): 325 mg via ORAL
  Filled 2013-12-08 (×2): qty 1

## 2013-12-08 MED ORDER — MORPHINE SULFATE 2 MG/ML IJ SOLN
0.5000 mg | INTRAMUSCULAR | Status: DC | PRN
  Administered 2013-12-08: 0.5 mg via INTRAVENOUS
  Filled 2013-12-08: qty 1

## 2013-12-08 NOTE — Progress Notes (Signed)
Patient asking for pain medication.  Writer went into room to assess patient.  Patient laying in bed, awake.  Patient states she is having chest heaviness and lower, center back pain.  Vital signs are temperature 98.8 (oral), respirations 20 per minute, pulse is 78, blood pressure is 109/88, and 100% on room air.  Nurse Tech is getting EKG now.  Dr. Cruzita Lederer paged and talked to verbally.  Will continue to monitor patient.  Dr. Cruzita Lederer at bedside now seeing patient.

## 2013-12-08 NOTE — Progress Notes (Signed)
PROGRESS NOTE  Jacqueline Woodard:[id number] DOB: 1985/12/15 DOA: 12/05/2013 PCP: No PCP Per Patient  HPI: 28 year old female with a history of asthma and GERD presented with sepsis due to RLE cellulitis.   Subjective/ 24 H Interval events - feels like her cellulitis is improving - afebrile  Assessment/Plan: Sepsis  - Secondary to the right lower extremity cellulitis  - Present at the time of admission  - change coverage to clindamycin given AKI - She was on Zosyn, despite the fact that she had a history of gram-negative skin infection there is no evidence of abscess or ulceration, Zosyn discontinued  Cellulitis right lower extremity  - Venous duplex right lower extremity showed no evidence of DVT  - CPK elevated likely secondary to right lower extremity cellulitis.  - There is some edema, patient said she had history of right lymphedema, pulses palpable distally.  - Still warm to touch, less tender.  Abdominal pain/nausea and vomiting  - Abdominal exam was completely benign without pain on my examination  - Urine pregnancy test is negative  - Urinalysis without pyuria.  - C. difficile PCR negative Acute kidney injury  - Baseline creatinine was 0.9, creatinine increased to 1.3 on 9/22 now 1.8, in the setting of sepsis, ?Vancomycin. Patient also refusing vancomycin this morning, switch to clindamycin - continue IVF Microcytic anemia  - she is iron deficient - start iron supplements GERD  -continue PPI  Diet: regular Fluids: none DVT Prophylaxis: Lovenox  Code Status: Full Family Communication: d/w patient  Disposition Plan: inpatient  Consultants:  None   Procedures:  None    Antibiotics Zosyn Vancomycin >> 9/23 Clindamycin 9/23 >>  Studies  Filed Vitals:   12/07/13 1506 12/07/13 2313 12/08/13 0601 12/08/13 1314  BP: 110/68 112/48 115/70 114/78  Pulse: 85 77 96 73  Temp: 99.9 F (37.7 C) 99.2 F (37.3 C) 99.3 F (37.4 C) 98.2 F (36.8 C)    TempSrc: Oral Oral Oral Oral  Resp: 20 20 24 22   Height:      Weight:      SpO2: 100% 100% 100% 99%    Intake/Output Summary (Last 24 hours) at 12/08/13 1501 Last data filed at 12/08/13 1314  Gross per 24 hour  Intake   2470 ml  Output      0 ml  Net   2470 ml   Filed Weights   12/05/13 1638  Weight: 91.627 kg (202 lb)    Exam:  General:  NAD  Cardiovascular: RRR  Respiratory: CTA biL  Abdomen: non tender  MSK: RLE swelling, mild erythema  Data Reviewed: Basic Metabolic Panel:  Recent Labs Lab 12/05/13 1457 12/06/13 0436 12/07/13 0410 12/08/13 0653  NA 136* 137 139 139  K 3.6* 4.2 3.9 3.9  CL 99 105 108 107  CO2 22 20 22  18*  GLUCOSE 71 88 88 77  BUN 18 12 7 13   CREATININE 1.19* 0.99 1.30* 1.78*  CALCIUM 8.4 7.8* 7.9* 8.2*   Liver Function Tests:  Recent Labs Lab 12/05/13 1457  AST 25  ALT 5  ALKPHOS 120*  BILITOT 0.6  PROT 7.6  ALBUMIN 3.1*    Recent Labs Lab 12/05/13 1434  LIPASE 13   No results found for this basename: AMMONIA,  in the last 168 hours CBC:  Recent Labs Lab 12/05/13 1457 12/06/13 0540 12/07/13 0410  WBC 17.7* 11.7* 9.2  NEUTROABS 15.0*  --   --   HGB 11.0* 9.3* 9.7*  HCT 34.5* 29.4* 30.8*  MCV 78.1 80.3 79.6  PLT 238 208 221   Cardiac Enzymes:  Recent Labs Lab 12/05/13 2020  CKTOTAL 621*   BNP (last 3 results) No results found for this basename: PROBNP,  in the last 8760 hours CBG: No results found for this basename: GLUCAP,  in the last 168 hours  Recent Results (from the past 240 hour(s))  CULTURE, BLOOD (ROUTINE X 2)     Status: None   Collection Time    12/05/13  2:57 PM      Result Value Ref Range Status   Specimen Description BLOOD RIGHT ANTECUBITAL   Final   Special Requests BOTTLES DRAWN AEROBIC AND ANAEROBIC 2ML   Final   Culture  Setup Time     Final   Value: 12/05/2013 18:15     Performed at [facility]   Culture     Final   Value:        BLOOD CULTURE RECEIVED NO GROWTH TO  DATE CULTURE WILL BE HELD FOR 5 DAYS BEFORE ISSUING A FINAL NEGATIVE REPORT     Performed at [facility]   Report Status PENDING   Incomplete  URINE CULTURE     Status: None   Collection Time    12/05/13  6:24 PM      Result Value Ref Range Status   Specimen Description URINE, CLEAN CATCH   Final   Special Requests NONE   Final   Culture  Setup Time     Final   Value: 12/06/2013 01:57     Performed at [place]     Final   Value: 75,000 COLONIES/ML     Performed at [facility]   Culture     Final   Value: Multiple bacterial morphotypes present, none predominant. Suggest appropriate recollection if clinically indicated.     Performed at [facility]   Report Status 12/06/2013 FINAL   Final  CULTURE, BLOOD (ROUTINE X 2)     Status: None   Collection Time    12/05/13 11:08 PM      Result Value Ref Range Status   Specimen Description BLOOD LEFT ARM   Final   Special Requests BOTTLES DRAWN AEROBIC ONLY 8CC   Final   Culture  Setup Time     Final   Value: 12/06/2013 09:41     Performed at [facility]   Culture     Final   Value:        BLOOD CULTURE RECEIVED NO GROWTH TO DATE CULTURE WILL BE HELD FOR 5 DAYS BEFORE ISSUING A FINAL NEGATIVE REPORT     Performed at [facility]   Report Status PENDING   Incomplete  CLOSTRIDIUM DIFFICILE BY PCR     Status: None   Collection Time    12/07/13 10:24 AM      Result Value Ref Range Status   C difficile by pcr NEGATIVE  NEGATIVE Final   Comment: Performed at [facility]     Studies: No results found.  Scheduled Meds: . bisacodyl  10 mg Rectal Once  . clindamycin (CLEOCIN) IV  600 mg Intravenous 3 times per day  . enoxaparin (LOVENOX) injection  40 mg Subcutaneous Q24H  . esomeprazole  40 mg Oral Q1200  . promethazine  12.5 mg Intravenous Once  . sodium chloride  3 mL Intravenous Q12H   Continuous Infusions: . sodium chloride 0.9 % 1,000 mL with potassium  chloride 20 mEq infusion 100 mL/hr at 12/08/13 1003    Active Problems:   Lymphedema   Sepsis   Cellulitis, leg   Recurrent vomiting   Microcytic anemia   Time spent: [place], MD [facility] Pager [phone]. If 7 PM - 7 AM, please contact night-coverage at [email or web address], password [facility] 12/08/2013, 3:01 PM  LOS: 3 days

## 2013-12-09 LAB — BASIC METABOLIC PANEL
Anion gap: 12 (ref 5–15)
BUN: 16 mg/dL (ref 6–23)
CHLORIDE: 108 meq/L (ref 96–112)
CO2: 21 mEq/L (ref 19–32)
Calcium: 8.2 mg/dL — ABNORMAL LOW (ref 8.4–10.5)
Creatinine, Ser: 1.84 mg/dL — ABNORMAL HIGH (ref 0.50–1.10)
GFR calc Af Amer: 42 mL/min — ABNORMAL LOW (ref >90)
GFR, EST NON AFRICAN AMERICAN: 36 mL/min — AB (ref >90)
GLUCOSE: 93 mg/dL (ref 70–99)
POTASSIUM: 3.9 meq/L (ref 3.7–5.3)
SODIUM: 141 meq/L (ref 137–147)

## 2013-12-09 LAB — TROPONIN I

## 2013-12-09 MED ORDER — PROMETHAZINE HCL 25 MG/ML IJ SOLN
12.5000 mg | Freq: Four times a day (QID) | INTRAMUSCULAR | Status: DC | PRN
  Administered 2013-12-09: 12.5 mg via INTRAVENOUS
  Filled 2013-12-09: qty 1

## 2013-12-09 MED ORDER — CLINDAMYCIN HCL 300 MG PO CAPS
450.0000 mg | ORAL_CAPSULE | Freq: Three times a day (TID) | ORAL | Status: DC
  Administered 2013-12-09 – 2013-12-10 (×3): 450 mg via ORAL
  Filled 2013-12-09 (×4): qty 1

## 2013-12-09 NOTE — Progress Notes (Signed)
PROGRESS NOTE  CHANNIE BOSTICK UVO:[id number] DOB: [birth year] DOA: 12/05/2013 PCP: No PCP Per Patient  HPI: 28 year old female with a history of asthma and GERD presented with sepsis due to RLE cellulitis.   Subjective/ 24 H Interval events - cellulitis is improving - complains of chronic nausea, refuses antiemetics  Assessment/Plan:  Sepsis  - Secondary to the right lower extremity cellulitis  - Present at the time of admission  - changed coverage to clindamycin on 9/23 given AKI, continues to improve, change to oral today  Cellulitis right lower extremity  - Venous duplex right lower extremity showed no evidence of DVT  - CPK elevated likely secondary to right lower extremity cellulitis.  - There is some edema, patient said she had history of right lymphedema, pulses palpable distally.  - Still warm to touch, less tender.   Abdominal pain/nausea and vomiting  - Abdominal exam was completely benign without pain on my examination  - Urine pregnancy test is negative  - Urinalysis without pyuria.  - C. difficile PCR negative - this is chronic  Acute kidney injury  - Baseline creatinine was 0.9, creatinine increased to 1.3 on 9/22 now 1.8, in the setting of sepsis, ?Vancomycin. - continue IVF - renal function seems to be stabilized today. - has a history of renal failure in the past and was close to HD in 2008 with Cr in the 6 range, tells me that it was due to antibiotics.   Microcytic anemia  - she is iron deficient - start iron supplements  GERD  -continue PPI  Diet: regular Fluids: none DVT Prophylaxis: Lovenox  Code Status: Full Family Communication: d/w patient  Disposition Plan: inpatient  Consultants:  None   Procedures:  None    Antibiotics Zosyn Vancomycin >> 9/23 Clindamycin 9/23 >>  Studies  Filed Vitals:   12/08/13 1314 12/08/13 1712 12/08/13 2056 12/09/13 0544  BP: 114/78 109/88 110/72 114/79  Pulse: 73 78 79 81  Temp: 98.2 F  (36.8 C) 98.8 F (37.1 C) 97.6 F (36.4 C) 99 F (37.2 C)  TempSrc: Oral Oral Oral Oral  Resp: 22 20 20 20   Height:      Weight:      SpO2: 99% 100% 100% 100%    Intake/Output Summary (Last 24 hours) at 12/09/13 1240 Last data filed at 12/09/13 0827  Gross per 24 hour  Intake 1336.33 ml  Output      0 ml  Net 1336.33 ml   Filed Weights   12/05/13 1638  Weight: 91.627 kg (202 lb)    Exam:  General:  NAD  Cardiovascular: RRR  Respiratory: CTA biL  Abdomen: non tender  MSK: RLE swelling, mild erythema  Data Reviewed: Basic Metabolic Panel:  Recent Labs Lab 12/05/13 1457 12/06/13 0436 12/07/13 0410 12/08/13 0653 12/09/13 0457  NA 136* 137 139 139 141  K 3.6* 4.2 3.9 3.9 3.9  CL 99 105 108 107 108  CO2 22 20 22  18* 21  GLUCOSE 71 88 88 77 93  BUN 18 12 7 13 16   CREATININE 1.19* 0.99 1.30* 1.78* 1.84*  CALCIUM 8.4 7.8* 7.9* 8.2* 8.2*   Liver Function Tests:  Recent Labs Lab 12/05/13 1457  AST 25  ALT 5  ALKPHOS 120*  BILITOT 0.6  PROT 7.6  ALBUMIN 3.1*    Recent Labs Lab 12/05/13 1434  LIPASE 13   No results found for this basename: AMMONIA,  in the last 168 hours CBC:  Recent Labs Lab 12/05/13  1457 12/06/13 0540 12/07/13 0410  WBC 17.7* 11.7* 9.2  NEUTROABS 15.0*  --   --   HGB 11.0* 9.3* 9.7*  HCT 34.5* 29.4* 30.8*  MCV 78.1 80.3 79.6  PLT 238 208 221   Cardiac Enzymes:  Recent Labs Lab 12/05/13 2020 12/08/13 1820 12/08/13 2320 12/09/13 0457  CKTOTAL 621*  --   --   --   TROPONINI  --  <0.30 <0.30 <0.30   BNP (last 3 results) No results found for this basename: PROBNP,  in the last 8760 hours CBG: No results found for this basename: GLUCAP,  in the last 168 hours  Recent Results (from the past 240 hour(s))  CULTURE, BLOOD (ROUTINE X 2)     Status: None   Collection Time    12/05/13  2:57 PM      Result Value Ref Range Status   Specimen Description BLOOD RIGHT ANTECUBITAL   Final   Special Requests BOTTLES DRAWN  AEROBIC AND ANAEROBIC 2ML   Final   Culture  Setup Time     Final   Value: 12/05/2013 18:15     Performed at [facility]   Culture     Final   Value:        BLOOD CULTURE RECEIVED NO GROWTH TO DATE CULTURE WILL BE HELD FOR 5 DAYS BEFORE ISSUING A FINAL NEGATIVE REPORT     Performed at [facility]   Report Status PENDING   Incomplete  URINE CULTURE     Status: None   Collection Time    12/05/13  6:24 PM      Result Value Ref Range Status   Specimen Description URINE, CLEAN CATCH   Final   Special Requests NONE   Final   Culture  Setup Time     Final   Value: 12/06/2013 01:57     Performed at [place]     Final   Value: 75,000 COLONIES/ML     Performed at [facility]   Culture     Final   Value: Multiple bacterial morphotypes present, none predominant. Suggest appropriate recollection if clinically indicated.     Performed at [facility]   Report Status 12/06/2013 FINAL   Final  CULTURE, BLOOD (ROUTINE X 2)     Status: None   Collection Time    12/05/13 11:08 PM      Result Value Ref Range Status   Specimen Description BLOOD LEFT ARM   Final   Special Requests BOTTLES DRAWN AEROBIC ONLY 8CC   Final   Culture  Setup Time     Final   Value: 12/06/2013 09:41     Performed at [facility]   Culture     Final   Value:        BLOOD CULTURE RECEIVED NO GROWTH TO DATE CULTURE WILL BE HELD FOR 5 DAYS BEFORE ISSUING A FINAL NEGATIVE REPORT     Performed at [facility]   Report Status PENDING   Incomplete  CLOSTRIDIUM DIFFICILE BY PCR     Status: None   Collection Time    12/07/13 10:24 AM      Result Value Ref Range Status   C difficile by pcr NEGATIVE  NEGATIVE Final   Comment: Performed at [facility]     Studies: No results found.  Scheduled Meds: . bisacodyl  10 mg Rectal Once  . clindamycin  450 mg Oral 3 times  per day  . enoxaparin (LOVENOX) injection  40 mg Subcutaneous Q24H  .  esomeprazole  40 mg Oral Q1200  . ferrous sulfate  325 mg Oral Q breakfast  . sodium chloride  3 mL Intravenous Q12H   Continuous Infusions: . sodium chloride 0.9 % 1,000 mL with potassium chloride 20 mEq infusion 50 mL/hr at 12/09/13 0857    Active Problems:   Lymphedema   Sepsis   Cellulitis, leg   Recurrent vomiting   Microcytic anemia   Time spent: [place], MD [facility] Pager [phone]. If 7 PM - 7 AM, please contact night-coverage at [email or web address], password [facility] 12/09/2013, 12:40 PM  LOS: 4 days

## 2013-12-09 NOTE — Progress Notes (Signed)
Pt c/o nausea this AM and has not yet ordered breakfast. Dr Cruzita Lederer has changed pt antiemetic to pheneran. Pt refused this med when offered stating "I already know it won't work." States she will attempt to order some grits for breakfast. Will monitor.

## 2013-12-10 LAB — BASIC METABOLIC PANEL
ANION GAP: 13 (ref 5–15)
BUN: 16 mg/dL (ref 6–23)
CO2: 19 mEq/L (ref 19–32)
CREATININE: 1.68 mg/dL — AB (ref 0.50–1.10)
Calcium: 8.1 mg/dL — ABNORMAL LOW (ref 8.4–10.5)
Chloride: 108 mEq/L (ref 96–112)
GFR, EST AFRICAN AMERICAN: 47 mL/min — AB (ref >90)
GFR, EST NON AFRICAN AMERICAN: 40 mL/min — AB (ref >90)
Glucose, Bld: 116 mg/dL — ABNORMAL HIGH (ref 70–99)
POTASSIUM: 3.7 meq/L (ref 3.7–5.3)
Sodium: 140 mEq/L (ref 137–147)

## 2013-12-10 MED ORDER — CLINDAMYCIN HCL 150 MG PO CAPS: 450.0000 mg | ORAL_CAPSULE | Freq: Three times a day (TID) | ORAL | Status: DC

## 2013-12-10 MED ORDER — ONDANSETRON 4 MG PO TBDP: 4.0000 mg | ORAL_TABLET | Freq: Three times a day (TID) | ORAL | Status: DC | PRN

## 2013-12-10 MED ORDER — FERROUS SULFATE 325 (65 FE) MG PO TABS: 325.0000 mg | ORAL_TABLET | Freq: Every day | ORAL | Status: DC

## 2013-12-10 MED ORDER — FLUCONAZOLE 150 MG PO TABS
150.0000 mg | ORAL_TABLET | Freq: Once | ORAL | Status: AC
  Administered 2013-12-10: 150 mg via ORAL
  Filled 2013-12-10: qty 1

## 2013-12-10 MED ORDER — HYDROCODONE-ACETAMINOPHEN 5-325 MG PO TABS: 1.0000 | ORAL_TABLET | ORAL | Status: DC | PRN

## 2013-12-10 NOTE — Discharge Summary (Signed)
Physician Discharge Summary  RAYLEIGH GILLYARD KWI:[id number] DOB: 1985-03-24 DOA: 12/05/2013  PCP: No PCP Per Patient  Admit date: 12/05/2013 Discharge date: 12/10/2013  Time spent: 35inutes  Recommendations for Outpatient Follow-up:  1. Follow up with PCP in [facility] clinic in 1-2 weeks  Discharge Diagnoses:  Active Problems:   Lymphedema   Sepsis   Cellulitis, leg   Recurrent vomiting   Microcytic anemia  Discharge Condition: stable  Diet recommendation: regular  Filed Weights   12/05/13 1638  Weight: 91.627 kg (202 lb)    History of present illness:  28 year old female with a history of asthma and GERD presents with two-day history of fevers, nausea, vomiting, diarrhea. The patient states that she had temperature up to 104.59F at home. She had 2 episodes of emesis on the day of admission without any blood. She also had 4 loose stools on the day of admission without any hematochezia or melena. The patient denies any recent travels or recent antibiotics. She denies any undercooked or raw foods. In emergency department, the patient presented with suprapubic and epigastric abdominal pain. The patient was given fentanyl 50 mcg IV x1 with complete resolution of her abdominal pain. At the time of my interview and exam, the patient denied any abdominal discomfort whatsoever.  In addition, the patient complains of right lower extremity pain, edema, erythema that began over 24 hours prior to admission. The patient has had a history of right lower extremity lymphedema in the past. She has not been on any antibiotics recently. She denies any recent injuries or trauma. She denies any headache, visual disturbance, chest pain, shortness breath, coughing, hemoptysis, synovitis, or other unusual rashes. In the emergency department, the patient was noted to have fever 101.41F with tachycardia up to 138. The CBC was 17.7. BMP shows a creatinine of 1.19. Otherwise her electrolytes and  hepatic enzymes were unremarkable. Lipase was 13. Xray of right lower extremity was negative except for soft tissue edema. Lactic Acid was 1.6. Vancomycin and Zosyn were ordered  Hospital Course:  Sepsis due to cellulitis right lower extremity - patient admitted to telemetry floor and started on broad spectrum antibiotics. She underwent Venous duplex right lower extremity which showed no evidence of DVT;  - CPK elevated likely secondary to right lower extremity cellulitis.  - There is some edema, patient said she had history of right lymphedema, pulses palpable distally.  - she was placed initially on Vancomycin and Zosyn and her coverage was narrowed to Vancomycin alone. Patient started developing renal failure which was thought to be due to antibiotics, and was changed to Clindamycin on 9/23. She continued to improve and will be discharged home on oral clindamycin to complete a 14 day course.  Abdominal pain/nausea and vomiting - chronic - Abdominal exam was completely benign without pain on my examination  - Urine pregnancy test is negative  - Urinalysis without pyuria.  - C. difficile PCR negative  Acute kidney injury  - Baseline creatinine was 0.9, creatinine increased to 1.3 >> 1.78 >> 1.84 >> 1.68, in the setting of sepsis, ?Vancomycin. Patient reports a history of same in 2008 when her Cr was in the 6 range and she almost went on dialysis. She remembers it being due to some antibiotics. Renal function started to improve and will need outpatient follow up in 1-2 weeks to re-check BMP Microcytic anemia  - she is iron deficient  - start iron supplements  GERD  -continue PPI  Procedures:  None  Consultations:  None   Discharge Exam: Filed Vitals:   12/09/13 0544 12/09/13 1352 12/09/13 2108 12/10/13 0546  BP: 114/79 148/87 112/67 105/74  Pulse: 81 102 81 81  Temp: 99 F (37.2 C) 98.1 F (36.7 C) 99.4 F (37.4 C) 98.5 F (36.9 C)  TempSrc: Oral Axillary Oral Oral  Resp: 20  20 18 20   Height:      Weight:      SpO2: 100% 100% 100% 100%   General: NAD Cardiovascular: RRR Respiratory: CTA biL  Discharge Instructions    Medication List         acetaminophen 500 MG tablet  Commonly known as:  TYLENOL  Take 1,000 mg by mouth every 6 (six) hours as needed for fever.     clindamycin 150 MG capsule  Commonly known as:  CLEOCIN  Take 3 capsules (450 mg total) by mouth every 8 (eight) hours.     esomeprazole 40 MG capsule  Commonly known as:  NEXIUM  Take 40 mg by mouth daily at 12 noon.     ferrous sulfate 325 (65 FE) MG tablet  Take 1 tablet (325 mg total) by mouth daily with breakfast.     HYDROcodone-acetaminophen 5-325 MG per tablet  Commonly known as:  NORCO/VICODIN  Take 1-2 tablets by mouth every 4 (four) hours as needed for moderate pain.     ondansetron 4 MG disintegrating tablet  Commonly known as:  ZOFRAN ODT  Take 1 tablet (4 mg total) by mouth every 8 (eight) hours as needed for nausea or vomiting.           Follow-up Information   Call [facility]    . (Call @ d/c for pcp appt or walk in within 2days of d/c.$20 co pay/photo id/bring meds in bottle.)    Contact information:   [address] [place] [phone]     The results of significant diagnostics from this hospitalization (including imaging, microbiology, ancillary and laboratory) are listed below for reference.    Significant Diagnostic Studies: Dg Tibia/fibula Right Port  12/05/2013   CLINICAL DATA:  Fever.  Diffuse swelling of the lower leg.  EXAM: PORTABLE RIGHT TIBIA AND FIBULA - 2 VIEW  COMPARISON:  10/11/2010  FINDINGS: Bones of the lower leg appear normal. There is nonspecific soft tissue edema diffusely. No radiopaque foreign object or other specific finding.  IMPRESSION: No bony abnormality.  Nonspecific diffuse soft tissue edema.   Electronically Signed   By: Nelson Chimes M.D.   On: 12/05/2013 15:38     Microbiology: Recent Results (from the past 240 hour(s))  CULTURE, BLOOD (ROUTINE X 2)     Status: None   Collection Time    12/05/13  2:57 PM      Result Value Ref Range Status   Specimen Description BLOOD RIGHT ANTECUBITAL   Final   Special Requests BOTTLES DRAWN AEROBIC AND ANAEROBIC 2ML   Final   Culture  Setup Time     Final   Value: 12/05/2013 18:15     Performed at [facility]   Culture     Final   Value:        BLOOD CULTURE RECEIVED NO GROWTH TO DATE CULTURE WILL BE HELD FOR 5 DAYS BEFORE ISSUING A FINAL NEGATIVE REPORT     Performed at [facility]   Report Status PENDING   Incomplete  URINE CULTURE     Status: None   Collection  Time    12/05/13  6:24 PM      Result Value Ref Range Status   Specimen Description URINE, CLEAN CATCH   Final   Special Requests NONE   Final   Culture  Setup Time     Final   Value: 12/06/2013 01:57     Performed at [facility]     Final   Value: 75,000 COLONIES/ML     Performed at [facility]   Culture     Final   Value: Multiple bacterial morphotypes present, none predominant. Suggest appropriate recollection if clinically indicated.     Performed at [facility]   Report Status 12/06/2013 FINAL   Final  CULTURE, BLOOD (ROUTINE X 2)     Status: None   Collection Time    12/05/13 11:08 PM      Result Value Ref Range Status   Specimen Description BLOOD LEFT ARM   Final   Special Requests BOTTLES DRAWN AEROBIC ONLY 8CC   Final   Culture  Setup Time     Final   Value: 12/06/2013 09:41     Performed at [facility]   Culture     Final   Value:        BLOOD CULTURE RECEIVED NO GROWTH TO DATE CULTURE WILL BE HELD FOR 5 DAYS BEFORE ISSUING A FINAL NEGATIVE REPORT     Performed at [facility]   Report Status PENDING   Incomplete  CLOSTRIDIUM DIFFICILE BY PCR     Status: None   Collection Time    12/07/13 10:24 AM      Result Value Ref Range Status   C  difficile by pcr NEGATIVE  NEGATIVE Final   Comment: Performed at [facility]: Basic Metabolic Panel:  Recent Labs Lab 12/06/13 0436 12/07/13 0410 12/08/13 0653 12/09/13 0457 12/10/13 0815  NA 137 139 139 141 140  K 4.2 3.9 3.9 3.9 3.7  CL 105 108 107 108 108  CO2 20 22 18* 21 19  GLUCOSE 88 88 77 93 116*  BUN 12 7 13 16 16   CREATININE 0.99 1.30* 1.78* 1.84* 1.68*  CALCIUM 7.8* 7.9* 8.2* 8.2* 8.1*   Liver Function Tests:  Recent Labs Lab 12/05/13 1457  AST 25  ALT 5  ALKPHOS 120*  BILITOT 0.6  PROT 7.6  ALBUMIN 3.1*    Recent Labs Lab 12/05/13 1434  LIPASE 13   CBC:  Recent Labs Lab 12/05/13 1457 12/06/13 0540 12/07/13 0410  WBC 17.7* 11.7* 9.2  NEUTROABS 15.0*  --   --   HGB 11.0* 9.3* 9.7*  HCT 34.5* 29.4* 30.8*  MCV 78.1 80.3 79.6  PLT 238 208 221   Cardiac Enzymes:  Recent Labs Lab 12/05/13 2020 12/08/13 1820 12/08/13 2320 12/09/13 0457  CKTOTAL 621*  --   --   --   TROPONINI  --  <0.30 <0.30 <0.30    Signed:  Marzetta Board  [facility] 12/10/2013, 3:51 PM

## 2013-12-11 LAB — CULTURE, BLOOD (ROUTINE X 2): Culture: NO GROWTH

## 2013-12-12 LAB — CULTURE, BLOOD (ROUTINE X 2): Culture: NO GROWTH

## 2013-12-16 ENCOUNTER — Encounter: Payer: Self-pay | Admitting: Internal Medicine

## 2013-12-16 ENCOUNTER — Ambulatory Visit: Payer: Self-pay | Attending: Internal Medicine | Admitting: Internal Medicine

## 2013-12-16 VITALS — BP 118/88 | HR 85 | Temp 98.2°F | Resp 16 | Ht 61.0 in | Wt 197.0 lb

## 2013-12-16 DIAGNOSIS — K219 Gastro-esophageal reflux disease without esophagitis: Secondary | ICD-10-CM | POA: Insufficient documentation

## 2013-12-16 DIAGNOSIS — N179 Acute kidney failure, unspecified: Secondary | ICD-10-CM | POA: Insufficient documentation

## 2013-12-16 DIAGNOSIS — T360X5D Adverse effect of penicillins, subsequent encounter: Secondary | ICD-10-CM | POA: Insufficient documentation

## 2013-12-16 DIAGNOSIS — I890 Lymphedema, not elsewhere classified: Secondary | ICD-10-CM | POA: Insufficient documentation

## 2013-12-16 DIAGNOSIS — Z79899 Other long term (current) drug therapy: Secondary | ICD-10-CM | POA: Insufficient documentation

## 2013-12-16 LAB — CBC WITH DIFFERENTIAL/PLATELET
BASOS ABS: 0.1 10*3/uL (ref 0.0–0.1)
Basophils Relative: 1 % (ref 0–1)
EOS ABS: 0.2 10*3/uL (ref 0.0–0.7)
Eosinophils Relative: 2 % (ref 0–5)
HCT: 33.6 % — ABNORMAL LOW (ref 36.0–46.0)
Hemoglobin: 11 g/dL — ABNORMAL LOW (ref 12.0–15.0)
Lymphocytes Relative: 41 % (ref 12–46)
Lymphs Abs: 3.1 10*3/uL (ref 0.7–4.0)
MCH: 24.8 pg — ABNORMAL LOW (ref 26.0–34.0)
MCHC: 32.7 g/dL (ref 30.0–36.0)
MCV: 75.7 fL — ABNORMAL LOW (ref 78.0–100.0)
Monocytes Absolute: 0.5 10*3/uL (ref 0.1–1.0)
Monocytes Relative: 6 % (ref 3–12)
NEUTROS PCT: 50 % (ref 43–77)
Neutro Abs: 3.8 10*3/uL (ref 1.7–7.7)
PLATELETS: 456 10*3/uL — AB (ref 150–400)
RBC: 4.44 MIL/uL (ref 3.87–5.11)
RDW: 14.4 % (ref 11.5–15.5)
WBC: 7.5 10*3/uL (ref 4.0–10.5)

## 2013-12-16 MED ORDER — MEDICAL COMPRESSION STOCKINGS MISC: Status: DC

## 2013-12-16 NOTE — Progress Notes (Signed)
HFU Pt is here to be treated for her cellulitis in her right leg. Pt states that she was stuck in her hand to draw blood and she claims that she now has numbness, tingling and pain in her first 2 fingers.

## 2013-12-16 NOTE — Patient Instructions (Signed)
Lymphedema Lymphedema is a swelling caused by the abnormal collection of lymph under the skin. The lymph is fluid from the tissues in your body that travels in the lymphatic system. This system is part of the immune system that includes lymph nodes and vessels. The lymph vessels collect and carry the excess fluid, fats, proteins, and wastes from the tissues of the body to the bloodstream. This system also works to clean and remove bacteria and waste products from the body.  Lymphedema occurs when the lymphatic system is blocked. When the lymph vessels or lymph nodes are blocked or damaged, lymph does not drain properly. This causes abnormal build up of lymph. This leads to swelling in the arms or legs. Lymphedema cannot be cured by medicines. But the swelling can be reduced by physical methods. CAUSES  There are two types of lymphedema. Primary lymphedema is caused by the absence or abnormality of the lymph vessel at birth. It is also known as inherited lymphedema, which occurs rarely. Secondary or acquired lymphedema occurs when the lymph vessel is damaged or blocked. The causes of lymph vessel blockage are:   Skin infection like cellulites.  Infection by parasites (filariasis).  Injury.  Cancer.  Radiation therapy.  Formation of scar tissue.  Surgery. SYMPTOMS  The symptoms of lymphedema are:  Abnormal swelling of the arm or leg.  Heavy or tight feeling in your arm or leg.  Tight-fitting shoes or rings.  Redness of skin over the affected area.  Limited movement of the affected limb.  Some patients complain about sensitivity to touch and discomfort in the limb(s) affected. You may not have these symptoms immediately following injury. They usually appear within a few days or even years after injury. Inform your caregiver, if you have any of these symptoms. Early treatment can avoid further problems.  DIAGNOSIS  First, your caregiver will inquire about any surgery you have had or  medicines you are taking. He will then examine you. Your caregiver may order special imaging tests, such as:  Lymphoscintigraphy (a test in which a low dose of radioactive substance is injected to trace the flow of lymph through the lymph vessels).  MRI (imaging tests using magnetic fields).  Computed tomography (test using special cross-sectional X-rays).  Duplex ultrasound (test using high-frequency sound waves to show the vessels and the blood flow on a screen).  Lymphangiography (special X-ray taken after injecting a contrast dye into the lymph vessel). It is now rarely done. TREATMENT  Lymphedema can be treated in different ways. Your caregiver will decide the type of treatment depending on the cause. Treatment may include:  Exercise: Special exercises will help fluid move out easily from the affected part. This should be done as per your caregiver's advice.  Manual lymph drainage: Gentle massage of the affected limb makes the fluid to move out more freely.  Compression: Compression stockings or external pump apply pressure over the affected limb. This helps the fluid to move out from the arm or leg. Bandaging can also help to move the fluid out from the affected part. Your caregiver will decide the method that suits you the best.  Medicines: Your caregiver may prescribe antibiotics, if you have infection.  Surgery: Your caregiver may advise surgery for severe lymphedema. It is reserved for special cases when the patient has difficulty moving. Your surgeon may remove excess tissue from the arm or leg. This will help to ease your movement. Physical therapy may have to be continued after surgery. HOME CARE INSTRUCTIONS    The area is very fragile and is predisposed to injury and infection.  Eat a healthy diet.  Exercise regularly as per advice.  Keep the affected area clean and dry.  Use gloves while cooking or gardening.  Protect your skin from cuts.  Use electric razor to  shave the affected area.  Keep affected limb elevated.  Do not wear tight clothes, shoes, or jewelry as it may cause the tissue to be strangled.  Do not use heat pads over the affected area.  Do not sit with cross legs.  Do not walk barefoot.  Do not carry weight on the affected arm.  Avoid having blood pressure checked on the affected limb. SEEK MEDICAL CARE IF:  You continue to have swelling in your limb. SEEK IMMEDIATE MEDICAL CARE IF:   You have high fever.  You have skin rash.  You have chills or sweats.  You have pain or redness.  You have a cut that does not heal. MAKE SURE YOU:   Understand these instructions.  Will watch your condition.  Will get help right away if you are not doing well or get worse. Document Released: 12/30/2006 Document Revised: 02/19/2012 Document Reviewed: 12/05/2008 ExitCare Patient Information 2015 [employer]. This information is not intended to replace advice given to you by your health care provider. Make sure you discuss any questions you have with your health care provider.  

## 2013-12-16 NOTE — Progress Notes (Signed)
Patient ID: Jacqueline Woodard, female   DOB: 1985/09/10, 28 y.o.   MRN: [id number]   Jacqueline Woodard, is a 28 y.o. female  QIW:[id number]  ERD:[id number]  DOB - 1985/10/18  CC:  Chief Complaint  Patient presents with  . Hospitalization Follow-up  . Establish Care       HPI: Jacqueline Woodard is a 28 y.o. female here today to establish medical care. She has medical history of right leg lymphedema, GERD and asthma was recently admitted for cellulitis of the right leg, treated with antibiotics, initially IV vancomycin and Zosyn, patient developed acute renal failure, and Zosyn was discontinued, she got better and was discharged on by mouth clindamycin. She finished the course of antibiotic yesterday. She has had lymphedema in the right leg since childhood, she has had recurrent cellulitis of the same leg and treated with antibiotics. At one point she also had acute renal failure from antibiotic use with a creatinine of 6. The patient is here today for hospital followup. She has no new complaints. She has paperwork from [facility] for work to be filled out. Redness, swelling and pain in her right leg has resolved. She has no urinary symptoms. She has no fever. She has breast reduction surgery in the past with a slight complication of wound dehiscent which was appropriately managed per patient. She denies smoking, she does not drink alcohol. She has family history positive for diabetes in both parent's side, no family history of breast cancer, no hypertension. Patient has No headache, No chest pain, No abdominal pain - No Nausea, No new weakness tingling or numbness, No Cough - SOB.  Allergies  Allergen Reactions  . Shellfish Allergy Swelling    SWELLING OF FACE AND THROAT   Past Medical History  Diagnosis Date  . Migraine   . GERD (gastroesophageal reflux disease)   . History of gastric ulcer   . Lymphedema of leg     right - no current med.  . Macromastia 02/2013  . Asthma     no inhaler use in  months   Current Outpatient Prescriptions on File Prior to Visit  Medication Sig Dispense Refill  . esomeprazole (NEXIUM) 40 MG capsule Take 40 mg by mouth daily at 12 noon.      Marland Kitchen acetaminophen (TYLENOL) 500 MG tablet Take 1,000 mg by mouth every 6 (six) hours as needed for fever.      . clindamycin (CLEOCIN) 150 MG capsule Take 3 capsules (450 mg total) by mouth every 8 (eight) hours.  21 capsule  0  . ferrous sulfate 325 (65 FE) MG tablet Take 1 tablet (325 mg total) by mouth daily with breakfast.  30 tablet  0  . HYDROcodone-acetaminophen (NORCO/VICODIN) 5-325 MG per tablet Take 1-2 tablets by mouth every 4 (four) hours as needed for moderate pain.  30 tablet  0  . ondansetron (ZOFRAN ODT) 4 MG disintegrating tablet Take 1 tablet (4 mg total) by mouth every 8 (eight) hours as needed for nausea or vomiting.  20 tablet  0   No current facility-administered medications on file prior to visit.   Family History  Problem Relation Age of Onset  . Thyroid disease Mother    History   Social History  . Marital Status: Single    Spouse Name: N/A    Number of Children: N/A  . Years of Education: N/A   Occupational History  . Not on file.   Social History Main Topics  . Smoking status: Never Smoker   .  Smokeless tobacco: Never Used  . Alcohol Use: No  . Drug Use: No  . Sexual Activity: Yes    Birth Control/ Protection: None   Other Topics Concern  . Not on file   Social History Narrative  . No narrative on file    Review of Systems: Constitutional: Negative for fever, chills, diaphoresis, activity change, appetite change and fatigue. HENT: Negative for ear pain, nosebleeds, congestion, facial swelling, rhinorrhea, neck pain, neck stiffness and ear discharge.  Eyes: Negative for pain, discharge, redness, itching and visual disturbance. Respiratory: Negative for cough, choking, chest tightness, shortness of breath, wheezing and stridor.  Cardiovascular: Negative for chest pain,  palpitations and leg swelling. Gastrointestinal: Negative for abdominal distention. Genitourinary: Negative for dysuria, urgency, frequency, hematuria, flank pain, decreased urine volume, difficulty urinating and dyspareunia.  Musculoskeletal: Negative for back pain, joint swelling, arthralgia and gait problem. Neurological: Negative for dizziness, tremors, seizures, syncope, facial asymmetry, speech difficulty, weakness, light-headedness, numbness and headaches.  Hematological: Negative for adenopathy. Does not bruise/bleed easily. Psychiatric/Behavioral: Negative for hallucinations, behavioral problems, confusion, dysphoric mood, decreased concentration and agitation.    Objective:   Filed Vitals:   12/16/13 0937  BP: 118/88  Pulse: 85  Temp: 98.2 F (36.8 C)  Resp: 16    Physical Exam: Constitutional: Patient appears well-developed and well-nourished. No distress. Artificial eyelashes,prominent HENT: Normocephalic, atraumatic, External right and left ear normal. Oropharynx is clear and moist.  Eyes: Conjunctivae and EOM are normal. PERRLA, no scleral icterus. Neck: Normal ROM. Neck supple. No JVD. No tracheal deviation. No thyromegaly. CVS: RRR, S1/S2 +, no murmurs, no gallops, no carotid bruit.  Pulmonary: Effort and breath sounds normal, no stridor, rhonchi, wheezes, rales.  Abdominal: Soft. BS +, no distension, tenderness, rebound or guarding.  Musculoskeletal: Normal range of motion. Lymphedematous right leg, no tenderness.  Lymphadenopathy: No lymphadenopathy noted, cervical, inguinal or axillary Neuro: Alert. Normal reflexes, muscle tone coordination. No cranial nerve deficit. Skin: Skin is warm and dry. No rash noted. Not diaphoretic. No erythema. No pallor. Psychiatric: Normal mood and affect. Behavior, judgment, thought content normal.  Lab Results  Component Value Date   WBC 9.2 12/07/2013   HGB 9.7* 12/07/2013   HCT 30.8* 12/07/2013   MCV 79.6 12/07/2013   PLT 221  12/07/2013   Lab Results  Component Value Date   CREATININE 1.68* 12/10/2013   BUN 16 12/10/2013   NA 140 12/10/2013   K 3.7 12/10/2013   CL 108 12/10/2013   CO2 19 12/10/2013    Lab Results  Component Value Date   HGBA1C  Value: 5.6 (NOTE)   The ADA recommends the following therapeutic goals for glycemic   control related to Hgb A1C measurement:   Goal of Therapy:   < 7.0% Hgb A1C   Action Suggested:  > 8.0% Hgb A1C   Ref:  Diabetes Care, 22, Suppl. 1, 1999 09/16/2006   Lipid Panel  No results found for this basename: chol, trig, hdl, cholhdl, vldl, ldlcalc       Assessment and plan:   1. Lymphedema of right lower extremity  - Patient may benefit from compression stocking   - ([place]) MISC; Apply to Right leg  Dispense: 1 each; Refill: 1  2. Acute kidney injury  - CBC with Differential - COMPLETE METABOLIC PANEL WITH GFR - Lipid panel - TSH Liberal fluid intake  Patient counseled extensively on nutrition and exercise  Return in about 6 months (around 06/17/2014) for Annual Physical.  The patient was  given clear instructions to go to ER or return to medical center if symptoms don't improve, worsen or new problems develop. The patient verbalized understanding. The patient was told to call to get lab results if they haven't heard anything in the next week.     This note has been created with Surveyor, quantity. Any transcriptional errors are unintentional.    Angelica Chessman, MD, [place], [place], [place], [place]   12/16/2013, 10:34 AM

## 2013-12-17 LAB — COMPLETE METABOLIC PANEL WITH GFR
ALK PHOS: 103 U/L (ref 39–117)
AST: 14 U/L (ref 0–37)
Albumin: 3.7 g/dL (ref 3.5–5.2)
BILIRUBIN TOTAL: 0.5 mg/dL (ref 0.2–1.2)
BUN: 6 mg/dL (ref 6–23)
CO2: 24 mEq/L (ref 19–32)
CREATININE: 1.21 mg/dL — AB (ref 0.50–1.10)
Calcium: 9.3 mg/dL (ref 8.4–10.5)
Chloride: 106 mEq/L (ref 96–112)
GFR, Est African American: 70 mL/min
GFR, Est Non African American: 61 mL/min
Glucose, Bld: 86 mg/dL (ref 70–99)
Potassium: 4.7 mEq/L (ref 3.5–5.3)
SODIUM: 142 meq/L (ref 135–145)
TOTAL PROTEIN: 7.4 g/dL (ref 6.0–8.3)

## 2013-12-17 LAB — LIPID PANEL
CHOL/HDL RATIO: 5.5 ratio
Cholesterol: 165 mg/dL (ref 0–200)
HDL: 30 mg/dL — AB (ref >39)
LDL CALC: 101 mg/dL — AB (ref 0–99)
Triglycerides: 171 mg/dL — ABNORMAL HIGH (ref <150)
VLDL: 34 mg/dL (ref 0–40)

## 2013-12-17 LAB — TSH: TSH: 2.586 u[IU]/mL (ref 0.350–4.500)

## 2013-12-31 ENCOUNTER — Telehealth: Payer: Self-pay | Admitting: Emergency Medicine

## 2013-12-31 NOTE — Telephone Encounter (Signed)
Left message on VM with lab results and education on diet/exercise

## 2013-12-31 NOTE — Telephone Encounter (Signed)
Message copied by Ricci Barker on Fri Dec 31, 2013  3:07 PM ------      Message from: Angelica Chessman E      Created: Wed Dec 29, 2013  6:23 PM       Please inform patient that her laboratory tests results show improvement in her kidney function, her cholesterol is mildly elevated, other tests are within normal limit. We encouraged low cholesterol fat, low fat diet. Drink plenty of water and we'll repeat the kidney function test at next appointment. ------

## 2014-06-04 ENCOUNTER — Inpatient Hospital Stay (HOSPITAL_COMMUNITY)
Admission: EM | Admit: 2014-06-04 | Discharge: 2014-06-11 | DRG: 872 | Disposition: A | Discharge status: Home / Self Care | Payer: Self-pay | Attending: Oncology | Admitting: Oncology

## 2014-06-04 ENCOUNTER — Emergency Department (HOSPITAL_COMMUNITY): Payer: Self-pay

## 2014-06-04 ENCOUNTER — Encounter (HOSPITAL_COMMUNITY): Payer: Self-pay | Admitting: *Deleted

## 2014-06-04 ENCOUNTER — Inpatient Hospital Stay (HOSPITAL_COMMUNITY): Payer: MEDICAID

## 2014-06-04 DIAGNOSIS — B370 Candidal stomatitis: Secondary | ICD-10-CM | POA: Diagnosis present

## 2014-06-04 DIAGNOSIS — Z91013 Allergy to seafood: Secondary | ICD-10-CM

## 2014-06-04 DIAGNOSIS — L03115 Cellulitis of right lower limb: Secondary | ICD-10-CM

## 2014-06-04 DIAGNOSIS — T50905A Adverse effect of unspecified drugs, medicaments and biological substances, initial encounter: Secondary | ICD-10-CM | POA: Diagnosis present

## 2014-06-04 DIAGNOSIS — J45909 Unspecified asthma, uncomplicated: Secondary | ICD-10-CM | POA: Diagnosis present

## 2014-06-04 DIAGNOSIS — Q820 Hereditary lymphedema: Secondary | ICD-10-CM

## 2014-06-04 DIAGNOSIS — R509 Fever, unspecified: Secondary | ICD-10-CM

## 2014-06-04 DIAGNOSIS — K219 Gastro-esophageal reflux disease without esophagitis: Secondary | ICD-10-CM

## 2014-06-04 DIAGNOSIS — E669 Obesity, unspecified: Secondary | ICD-10-CM | POA: Diagnosis present

## 2014-06-04 DIAGNOSIS — D649 Anemia, unspecified: Secondary | ICD-10-CM | POA: Diagnosis present

## 2014-06-04 DIAGNOSIS — R748 Abnormal levels of other serum enzymes: Secondary | ICD-10-CM

## 2014-06-04 DIAGNOSIS — N179 Acute kidney failure, unspecified: Secondary | ICD-10-CM | POA: Diagnosis not present

## 2014-06-04 DIAGNOSIS — R7309 Other abnormal glucose: Secondary | ICD-10-CM | POA: Diagnosis present

## 2014-06-04 DIAGNOSIS — Z8711 Personal history of peptic ulcer disease: Secondary | ICD-10-CM

## 2014-06-04 DIAGNOSIS — G43909 Migraine, unspecified, not intractable, without status migrainosus: Secondary | ICD-10-CM

## 2014-06-04 DIAGNOSIS — K143 Hypertrophy of tongue papillae: Secondary | ICD-10-CM | POA: Diagnosis present

## 2014-06-04 DIAGNOSIS — R52 Pain, unspecified: Secondary | ICD-10-CM

## 2014-06-04 DIAGNOSIS — K5900 Constipation, unspecified: Secondary | ICD-10-CM | POA: Diagnosis not present

## 2014-06-04 DIAGNOSIS — Z6839 Body mass index (BMI) 39.0-39.9, adult: Secondary | ICD-10-CM

## 2014-06-04 DIAGNOSIS — A419 Sepsis, unspecified organism: Principal | ICD-10-CM

## 2014-06-04 DIAGNOSIS — R1011 Right upper quadrant pain: Secondary | ICD-10-CM

## 2014-06-04 DIAGNOSIS — G43709 Chronic migraine without aura, not intractable, without status migrainosus: Secondary | ICD-10-CM | POA: Diagnosis present

## 2014-06-04 DIAGNOSIS — E162 Hypoglycemia, unspecified: Secondary | ICD-10-CM | POA: Diagnosis present

## 2014-06-04 DIAGNOSIS — M79661 Pain in right lower leg: Secondary | ICD-10-CM

## 2014-06-04 DIAGNOSIS — E876 Hypokalemia: Secondary | ICD-10-CM | POA: Diagnosis not present

## 2014-06-04 LAB — CBC WITH DIFFERENTIAL/PLATELET
BASOS PCT: 0 % (ref 0–1)
Basophils Absolute: 0 10*3/uL (ref 0.0–0.1)
EOS PCT: 0 % (ref 0–5)
Eosinophils Absolute: 0 10*3/uL (ref 0.0–0.7)
HEMATOCRIT: 41 % (ref 36.0–46.0)
Hemoglobin: 12.7 g/dL (ref 12.0–15.0)
LYMPHS PCT: 6 % — AB (ref 12–46)
Lymphs Abs: 0.8 10*3/uL (ref 0.7–4.0)
MCH: 25 pg — ABNORMAL LOW (ref 26.0–34.0)
MCHC: 31 g/dL (ref 30.0–36.0)
MCV: 80.6 fL (ref 78.0–100.0)
MONO ABS: 0.4 10*3/uL (ref 0.1–1.0)
Monocytes Relative: 3 % (ref 3–12)
Neutro Abs: 12.9 10*3/uL — ABNORMAL HIGH (ref 1.7–7.7)
Neutrophils Relative %: 91 % — ABNORMAL HIGH (ref 43–77)
Platelets: 197 10*3/uL (ref 150–400)
RBC: 5.09 MIL/uL (ref 3.87–5.11)
RDW: 13.7 % (ref 11.5–15.5)
WBC: 14.1 10*3/uL — ABNORMAL HIGH (ref 4.0–10.5)

## 2014-06-04 LAB — COMPREHENSIVE METABOLIC PANEL
ALBUMIN: 3.7 g/dL (ref 3.5–5.2)
ALT: 8 U/L (ref 0–35)
AST: 28 U/L (ref 0–37)
Alkaline Phosphatase: 118 U/L — ABNORMAL HIGH (ref 39–117)
Anion gap: 8 (ref 5–15)
BUN: 15 mg/dL (ref 6–23)
CHLORIDE: 104 mmol/L (ref 96–112)
CO2: 26 mmol/L (ref 19–32)
CREATININE: 1.02 mg/dL (ref 0.50–1.10)
Calcium: 9.1 mg/dL (ref 8.4–10.5)
GFR calc Af Amer: 85 mL/min — ABNORMAL LOW (ref >90)
GFR, EST NON AFRICAN AMERICAN: 73 mL/min — AB (ref >90)
GLUCOSE: 51 mg/dL — AB (ref 70–99)
Potassium: 4 mmol/L (ref 3.5–5.1)
Sodium: 138 mmol/L (ref 135–145)
Total Bilirubin: 0.8 mg/dL (ref 0.3–1.2)
Total Protein: 7.5 g/dL (ref 6.0–8.3)

## 2014-06-04 LAB — URINALYSIS, ROUTINE W REFLEX MICROSCOPIC
Bilirubin Urine: NEGATIVE
GLUCOSE, UA: NEGATIVE mg/dL
Hgb urine dipstick: NEGATIVE
KETONES UR: NEGATIVE mg/dL
Leukocytes, UA: NEGATIVE
Nitrite: NEGATIVE
Protein, ur: NEGATIVE mg/dL
SPECIFIC GRAVITY, URINE: 1.017 (ref 1.005–1.030)
Urobilinogen, UA: 0.2 mg/dL (ref 0.0–1.0)
pH: 7 (ref 5.0–8.0)

## 2014-06-04 LAB — GAMMA GT: GGT: 20 U/L (ref 7–51)

## 2014-06-04 LAB — I-STAT CHEM 8, ED
BUN: 18 mg/dL (ref 6–23)
CALCIUM ION: 1.16 mmol/L (ref 1.12–1.23)
Chloride: 103 mmol/L (ref 96–112)
Creatinine, Ser: 0.9 mg/dL (ref 0.50–1.10)
Glucose, Bld: 68 mg/dL — ABNORMAL LOW (ref 70–99)
HCT: 43 % (ref 36.0–46.0)
HEMOGLOBIN: 14.6 g/dL (ref 12.0–15.0)
POTASSIUM: 4.1 mmol/L (ref 3.5–5.1)
Sodium: 140 mmol/L (ref 135–145)
TCO2: 21 mmol/L (ref 0–100)

## 2014-06-04 LAB — I-STAT CG4 LACTIC ACID, ED: Lactic Acid, Venous: 4.3 mmol/L (ref 0.5–2.0)

## 2014-06-04 LAB — CBG MONITORING, ED: Glucose-Capillary: 99 mg/dL (ref 70–99)

## 2014-06-04 LAB — POC URINE PREG, ED: Preg Test, Ur: NEGATIVE

## 2014-06-04 LAB — MRSA PCR SCREENING: MRSA by PCR: NEGATIVE

## 2014-06-04 MED ORDER — HYDROCODONE-ACETAMINOPHEN 5-325 MG PO TABS
1.0000 | ORAL_TABLET | ORAL | Status: DC | PRN
  Administered 2014-06-04 (×2): 1 via ORAL
  Administered 2014-06-05 – 2014-06-09 (×13): 2 via ORAL
  Filled 2014-06-04 (×5): qty 2
  Filled 2014-06-04: qty 1
  Filled 2014-06-04 (×9): qty 2
  Filled 2014-06-04: qty 1

## 2014-06-04 MED ORDER — SODIUM CHLORIDE 0.9 % IV SOLN
INTRAVENOUS | Status: DC
  Administered 2014-06-04 – 2014-06-05 (×3): via INTRAVENOUS

## 2014-06-04 MED ORDER — DEXTROSE 50 % IV SOLN
1.0000 | Freq: Once | INTRAVENOUS | Status: AC
  Administered 2014-06-04: 50 mL via INTRAVENOUS
  Filled 2014-06-04: qty 50

## 2014-06-04 MED ORDER — ONDANSETRON HCL 4 MG PO TABS
4.0000 mg | ORAL_TABLET | Freq: Four times a day (QID) | ORAL | Status: DC | PRN
  Administered 2014-06-05 – 2014-06-10 (×2): 4 mg via ORAL
  Filled 2014-06-04 (×2): qty 1

## 2014-06-04 MED ORDER — ONDANSETRON HCL 4 MG/2ML IJ SOLN
4.0000 mg | Freq: Four times a day (QID) | INTRAMUSCULAR | Status: DC | PRN
  Administered 2014-06-04 – 2014-06-09 (×4): 4 mg via INTRAVENOUS
  Filled 2014-06-04 (×4): qty 2

## 2014-06-04 MED ORDER — SODIUM CHLORIDE 0.9 % IV BOLUS (SEPSIS)
1000.0000 mL | INTRAVENOUS | Status: DC
  Administered 2014-06-04 (×2): 1000 mL via INTRAVENOUS

## 2014-06-04 MED ORDER — SODIUM CHLORIDE 0.9 % IV BOLUS (SEPSIS)
1000.0000 mL | Freq: Once | INTRAVENOUS | Status: AC
  Administered 2014-06-04: 1000 mL via INTRAVENOUS

## 2014-06-04 MED ORDER — PANTOPRAZOLE SODIUM 40 MG PO TBEC
40.0000 mg | DELAYED_RELEASE_TABLET | Freq: Every day | ORAL | Status: DC
  Administered 2014-06-05 – 2014-06-11 (×6): 40 mg via ORAL
  Filled 2014-06-04 (×8): qty 1

## 2014-06-04 MED ORDER — SODIUM CHLORIDE 0.9 % IJ SOLN
3.0000 mL | Freq: Two times a day (BID) | INTRAMUSCULAR | Status: DC
  Administered 2014-06-04 – 2014-06-11 (×6): 3 mL via INTRAVENOUS

## 2014-06-04 MED ORDER — CLINDAMYCIN PHOSPHATE 600 MG/50ML IV SOLN
600.0000 mg | Freq: Three times a day (TID) | INTRAVENOUS | Status: DC
  Administered 2014-06-04 – 2014-06-10 (×17): 600 mg via INTRAVENOUS
  Filled 2014-06-04 (×21): qty 50

## 2014-06-04 MED ORDER — METOCLOPRAMIDE HCL 5 MG/ML IJ SOLN
10.0000 mg | Freq: Once | INTRAMUSCULAR | Status: AC
  Administered 2014-06-04: 10 mg via INTRAVENOUS
  Filled 2014-06-04: qty 2

## 2014-06-04 MED ORDER — MORPHINE SULFATE 4 MG/ML IJ SOLN
4.0000 mg | Freq: Once | INTRAMUSCULAR | Status: AC
Start: 2014-06-04 — End: 2014-06-04
  Administered 2014-06-04: 4 mg via INTRAVENOUS
  Filled 2014-06-04: qty 1

## 2014-06-04 MED ORDER — IBUPROFEN 800 MG PO TABS
800.0000 mg | ORAL_TABLET | Freq: Once | ORAL | Status: AC
  Administered 2014-06-04: 800 mg via ORAL
  Filled 2014-06-04: qty 1

## 2014-06-04 MED ORDER — SODIUM CHLORIDE 0.9 % IV BOLUS (SEPSIS)
500.0000 mL | Freq: Once | INTRAVENOUS | Status: AC
  Administered 2014-06-04: 500 mL via INTRAVENOUS

## 2014-06-04 MED ORDER — DIPHENHYDRAMINE HCL 50 MG/ML IJ SOLN
25.0000 mg | Freq: Once | INTRAMUSCULAR | Status: AC
  Administered 2014-06-04: 25 mg via INTRAVENOUS
  Filled 2014-06-04: qty 1

## 2014-06-04 MED ORDER — ENOXAPARIN SODIUM 40 MG/0.4ML ~~LOC~~ SOLN
40.0000 mg | SUBCUTANEOUS | Status: DC
Start: 2014-06-04 — End: 2014-06-05
  Administered 2014-06-04: 40 mg via SUBCUTANEOUS
  Filled 2014-06-04 (×2): qty 0.4

## 2014-06-04 MED ORDER — ACETAMINOPHEN 325 MG PO TABS
650.0000 mg | ORAL_TABLET | Freq: Once | ORAL | Status: AC
  Administered 2014-06-04: 650 mg via ORAL
  Filled 2014-06-04: qty 2

## 2014-06-04 MED ORDER — ONDANSETRON HCL 4 MG/2ML IJ SOLN
4.0000 mg | Freq: Once | INTRAMUSCULAR | Status: AC
  Administered 2014-06-04: 4 mg via INTRAVENOUS
  Filled 2014-06-04: qty 2

## 2014-06-04 MED ORDER — CLINDAMYCIN PHOSPHATE 600 MG/50ML IV SOLN
600.0000 mg | Freq: Once | INTRAVENOUS | Status: AC
  Administered 2014-06-04: 600 mg via INTRAVENOUS
  Filled 2014-06-04: qty 50

## 2014-06-04 NOTE — ED Provider Notes (Signed)
CSN: [id number]     Arrival date & time 06/04/14  1149 History   First MD Initiated Contact with Patient 06/04/14 1214     Chief Complaint  Patient presents with  . Headache     (Consider location/radiation/quality/duration/timing/severity/associated sxs/prior Treatment) HPI   29 year old female presents for evaluation of headache and right leg pain. Patient has history of migraine, and also history of recurrent lymphedema of right leg. Since yesterday she has gradual onset of sharp throbbing headache throughout her head in a bandlike fashion. Headaches associated with nausea but without vomiting. She also complaining of pain throughout the right side of body especially her right leg. Pain is rated as 8 out of 10, persistent. She endorses fever, chills, and body aches. Patient state symptoms felt similar to her prior episode when she was admitted to the hospital 2 weeks for sepsis. She denies light and sound sensitivity, vision changes, neck stiffness, chest pain or shortness of breath, productive cough, dysuria, hematuria, abdominal pain or rash.  Past Medical History  Diagnosis Date  . Migraine   . GERD (gastroesophageal reflux disease)   . History of gastric ulcer   . Lymphedema of leg     right - no current med.  . Macromastia 02/2013  . Asthma     no inhaler use in months   Past Surgical History  Procedure Laterality Date  . Wisdom tooth extraction    . Breast reduction surgery Bilateral 03/01/2013    Procedure: BILATERAL MAMMARY REDUCTION  (BREAST);  Surgeon: Cristine Polio, MD;  Location: [place];  Service: Plastics;  Laterality: Bilateral;   Family History  Problem Relation Age of Onset  . Thyroid disease Mother    History  Substance Use Topics  . Smoking status: Never Smoker   . Smokeless tobacco: Never Used  . Alcohol Use: No   OB History    No data available     Review of Systems  All other systems reviewed and are  negative.     Allergies  Shellfish allergy  Home Medications   Prior to Admission medications   Medication Sig Start Date End Date Taking? Authorizing Provider  acetaminophen (TYLENOL) 500 MG tablet Take 1,000 mg by mouth every 6 (six) hours as needed for fever.    Historical Provider, MD  clindamycin (CLEOCIN) 150 MG capsule Take 3 capsules (450 mg total) by mouth every 8 (eight) hours. 12/10/13   Costin Karlyne Greenspan, MD  Elastic Bandages & Supports (MEDICAL COMPRESSION STOCKINGS) [place] Apply to Right leg 12/16/13   Tresa Garter, MD  esomeprazole (NEXIUM) 40 MG capsule Take 40 mg by mouth daily at 12 noon.    Historical Provider, MD  ferrous sulfate 325 (65 FE) MG tablet Take 1 tablet (325 mg total) by mouth daily with breakfast. 12/10/13   Costin Karlyne Greenspan, MD  HYDROcodone-acetaminophen (NORCO/VICODIN) 5-325 MG per tablet Take 1-2 tablets by mouth every 4 (four) hours as needed for moderate pain. 12/10/13   Costin Karlyne Greenspan, MD  ondansetron (ZOFRAN ODT) 4 MG disintegrating tablet Take 1 tablet (4 mg total) by mouth every 8 (eight) hours as needed for nausea or vomiting. 12/10/13   Costin Karlyne Greenspan, MD   BP 123/78 mmHg  Pulse 134  Temp(Src) 103.2 F (39.6 C) (Oral)  Resp 18  SpO2 100% Physical Exam  Constitutional: She appears well-developed and well-nourished. No distress.  HENT:  Head: Atraumatic.  Right Ear: External ear normal.  Left Ear: External ear normal.  Mouth/Throat:  Oropharynx is clear and moist.  Eyes: Conjunctivae and EOM are normal. Pupils are equal, round, and reactive to light.  Neck: Neck supple.  No nuchal rigidity  Cardiovascular:  Tachycardia without murmurs rubs or gallops  Pulmonary/Chest: Effort normal and breath sounds normal.  Abdominal: Soft. Bowel sounds are normal. She exhibits no distension. There is no tenderness.  Musculoskeletal: She exhibits edema (Right lower extremity with nonpitting edema, tenderness throughout lower extremities without  frank cellulitis, no palpable cords, no erythema.).  Neurological: She is alert.  Skin: No rash noted.  Psychiatric: She has a normal mood and affect.  Nursing note and vitals reviewed.   ED Course  Procedures (including critical care time)  Patient presents with signs of sepsis without known origin. She normally developed right lower leg cellulitis in the past which she usually treated with antibiotic. She has had prior renal complication from antibiotic (vanc or zosyn) with acute renal failure nearly needing dialysis. Today on exam I do not appreciate obvious cellulitis on her right lower extremities. No evidence of infection to her hip on low back. Although patient endorse headache this is likely due to her fever as she has no nuchal rigidity to suggest meningitis. The chest x-ray is unremarkable. She has no abdominal pain at this time. She denies any dysuria and her urine shows no evidence of urinary tract infection. Patient meets sepsis criteria with a lactic acid of 4.3, a leukocytosis with WBC 14.1 with a left shift. Initial temperature is 103.2 which improves to 102 after receiving Tylenol. No hypoxia. Tachycardic likely secondary to fever. I did discussed with Dr. Maryan Rued who has also evaluate patient and request patient to be admitted for further workup of her fever of unknown source. Code sepsis activated. At this time we do not think LP is indicated.    3:43 PM Will continue with aggressive fluid hydration.  Pain medication for headache given.    3:52 PM I have consulted with Internal Medicine Resident who agrees to see and admit pt for further evaluation of her sepsis and fever of unknown origin.    CRITICAL CARE Performed by: Domenic Moras Total critical care time: 45 min Critical care time was exclusive of separately billable procedures and treating other patients. Critical care was necessary to treat or prevent imminent or life-threatening deterioration. Critical care was time  spent personally by me on the following activities: development of treatment plan with patient and/or surrogate as well as nursing, discussions with consultants, evaluation of patient's response to treatment, examination of patient, obtaining history from patient or surrogate, ordering and performing treatments and interventions, ordering and review of laboratory studies, ordering and review of radiographic studies, pulse oximetry and re-evaluation of patient's condition.   Labs Review Labs Reviewed  CBC WITH DIFFERENTIAL/PLATELET - Abnormal; Notable for the following:    WBC 14.1 (*)    MCH 25.0 (*)    Neutrophils Relative % 91 (*)    Neutro Abs 12.9 (*)    Lymphocytes Relative 6 (*)    All other components within normal limits  I-STAT CHEM 8, ED - Abnormal; Notable for the following:    Glucose, Bld 68 (*)    All other components within normal limits  I-STAT CG4 LACTIC ACID, ED - Abnormal; Notable for the following:    Lactic Acid, Venous 4.30 (*)    All other components within normal limits  CULTURE, BLOOD (ROUTINE X 2)  CULTURE, BLOOD (ROUTINE X 2)  URINE CULTURE  URINALYSIS, ROUTINE W REFLEX MICROSCOPIC  COMPREHENSIVE METABOLIC PANEL  POC URINE PREG, ED    Imaging Review Dg Chest 2 View  06/04/2014   CLINICAL DATA:  Patient states she has been having pain on the right side of the body along with nausea and vomiting X 1 day. Patient denies any cough or SOB. Patient has a hx of asthma. Patient has had no surgeries on the heart or lungs. Patient is a nonsmoker. Patient was shielded for the exam.  EXAM: CHEST  2 VIEW  COMPARISON:  04/27/2009  FINDINGS: Normal heart, mediastinum and hila. Clear lungs. No pleural effusion or pneumothorax. Bony thorax is unremarkable.  IMPRESSION: Normal chest radiographs.   Electronically Signed   By: Lajean Manes M.D.   On: 06/04/2014 13:28     EKG Interpretation None      MDM   Final diagnoses:  Fever  Sepsis, due to unspecified organism   Fever, unknown origin  Pain of right lower leg  Hypoglycemia    BP 104/57 mmHg  Pulse 123  Temp(Src) 100.2 F (37.9 C) (Oral)  Resp 18  SpO2 97%  LMP 04/19/2014     Domenic Moras, PA-C 06/04/14 [place], MD 06/05/14 [phone]

## 2014-06-04 NOTE — H&P (Signed)
Date: 06/04/2014               Patient Name:  Jacqueline Woodard MRN: [id number]  DOB: 04/19/1985 Age / Sex: 29 y.o., female   PCP: No Pcp Per Patient         Medical Service: Internal Medicine Teaching Service         Attending Physician: Dr. Murriel Hopper    First Contact: Dr. Venita Lick Pager: [phone]  Second Contact: Dr. Bing Neighbors Pager: [phone]       After Hours (After 5p/  First Contact Pager: [phone]  weekends / holidays): Second Contact Pager: [phone]   Chief Complaint: headache since last night with nausea today  History of Present Illness: [name] is a 29 yo woman with a history of migraine headaches, GERD and right leg lymphedema who presented to the ED because of her headache. The episode started last night with right greater than left diffuse pain. She felt like hammers were hitting the inside of her head. She noticed photophobia and phonophobia and later, the onset of nausea. She had no changes in vision. In many ways, this headache reminded her of her typical migraines, which occur about once every other month. She typically gets relief from Excedrin, but this headache persisted. Her pain and nausea have decreased in the hospital, and she would like to eat.  However, while in the ED, she was noted to have a leukocytosis to 14.1, tachycardia to 134, her temperature as high as 103.2 and her lactic acid was 4.30. Her right lower extremity lymphedema was noted to be slightly warm and erythematous. On an admission in 11/2013, she presented in sepsis due to her right lower extremity cellulitis. At that time, she developed AKI on vancomycin but her infection resolved on clindamycin. She does admit to sick contacts at work who had "flu-like symptoms".  Meds: No current facility-administered medications for this encounter.   Current Outpatient Prescriptions  Medication Sig Dispense Refill  . acetaminophen (TYLENOL) 500 MG tablet Take 1,000 mg by mouth every  6 (six) hours as needed for fever.    . esomeprazole (NEXIUM) 40 MG capsule Take 40 mg by mouth daily at 12 noon.    Water engineer Bandages & Supports (MEDICAL COMPRESSION STOCKINGS) [place] Apply to Right leg 1 each 1  . ferrous sulfate 325 (65 FE) MG tablet Take 1 tablet (325 mg total) by mouth daily with breakfast. 30 tablet 0  . HYDROcodone-acetaminophen (NORCO/VICODIN) 5-325 MG per tablet Take 1-2 tablets by mouth every 4 (four) hours as needed for moderate pain. 30 tablet 0  . ondansetron (ZOFRAN ODT) 4 MG disintegrating tablet Take 1 tablet (4 mg total) by mouth every 8 (eight) hours as needed for nausea or vomiting. 20 tablet 0    Allergies: Allergies as of 06/04/2014 - Review Complete 06/04/2014  Allergen Reaction Noted  . Shellfish allergy Swelling 10/14/2010   Past Medical History  Diagnosis Date  . Migraine   . GERD (gastroesophageal reflux disease)   . History of gastric ulcer   . Lymphedema of leg     right - no current med.  Malka So Chiari Malformation 02/2013  . Asthma     no inhaler use in months   Past Surgical History  Procedure Laterality Date  . Wisdom tooth extraction    . Breast reduction surgery Bilateral 03/01/2013    Procedure: BILATERAL MAMMARY REDUCTION  (BREAST);  Surgeon: Cristine Polio, MD;  Location: [place];  Service: Clinical cytogeneticist;  Laterality: Bilateral;   Family History  Problem Relation Age of Onset  . Thyroid disease Mother    History   Social History  . Marital Status: Single    Spouse Name: N/A  . Number of Children: N/A  . Years of Education: N/A   Occupational History  . Not on file.   Social History Main Topics  . Smoking status: Never Smoker   . Smokeless tobacco: Never Used  . Alcohol Use: No  . Drug Use: No  . Sexual Activity: Yes    Birth Control/ Protection: None   Other Topics Concern  . Not on file   Social History Narrative    Review of Systems: See HPI  Physical Exam: Blood  pressure 94/52, pulse 111, temperature 100.2 F (37.9 C), temperature source Oral, resp. rate 18, height 5' 1"  (1.549 m), weight 204 lb (92.534 kg), last menstrual period 04/19/2014, SpO2 95 %. Appearance: in NAD, lying in bed with mother and friend at bedside, resting with lights off HEENT: AT/, PERRL, EOMi, no lymphadenopathy, dry MM Heart: RRR, normal S1S2 Lungs: CTAB, no wheezes Abdomen: BS+, soft, tender in the RUQ to deep palpation Musculoskeletal: normal range of motion Extremities: right lower extremity lymphedema extending from knee to foot with erythema and warmth in a patch on the ventral side, trace edema in LLL as well, right hand appeared swollen but nontender Neurologic: A&Ox3, grossly intact   Lab results: Basic Metabolic Panel:  Recent Labs  06/04/14 1414 06/04/14 1510  NA 140 138  K 4.1 4.0  CL 103 104  CO2  --  26  GLUCOSE 68* 51*  BUN 18 15  CREATININE 0.90 1.02  CALCIUM  --  9.1   Liver Function Tests:  Recent Labs  06/04/14 1510  AST 28  ALT 8  ALKPHOS 118*  BILITOT 0.8  PROT 7.5  ALBUMIN 3.7   CBC:  Recent Labs  06/04/14 1400 06/04/14 1414  WBC 14.1*  --   NEUTROABS 12.9*  --   HGB 12.7 14.6  HCT 41.0 43.0  MCV 80.6  --   PLT 197  --   Differential:  Relative neutrophils 91 Relative lymphocytes 6 Relative monocytes 3 Relative eosinophils 0 Relative basophils 0 NEUT # 12.9 Absolute lymphocytes 0.8 Absolute monocytes 0.4 Absolute eosinophils 0.0 Absolute basophils 0.0   Urine Drug Screen: Drugs of Abuse     Component Value Date/Time   LABOPIA NONE DETECTED 10/30/2008 0210   COCAINSCRNUR NONE DETECTED 10/30/2008 0210   LABBENZ NONE DETECTED 10/30/2008 0210   AMPHETMU NONE DETECTED 10/30/2008 0210   THCU NONE DETECTED 10/30/2008 0210   LABBARB  10/30/2008 0210    NONE DETECTED        DRUG SCREEN FOR MEDICAL PURPOSES ONLY.  IF CONFIRMATION IS NEEDED FOR ANY PURPOSE, NOTIFY LAB WITHIN 5 DAYS.        LOWEST DETECTABLE  LIMITS FOR URINE DRUG SCREEN Drug Class       Cutoff (ng/mL) Amphetamine      1000 Barbiturate      200 Benzodiazepine   170 Tricyclics       017 Opiates          300 Cocaine          300 THC              50    Urinalysis:  Recent Labs  06/04/14 1407  COLORURINE YELLOW  LABSPEC 1.017  PHURINE 7.0  GLUCOSEU NEGATIVE  HGBUR  NEGATIVE  BILIRUBINUR NEGATIVE  KETONESUR NEGATIVE  PROTEINUR NEGATIVE  UROBILINOGEN 0.2  NITRITE NEGATIVE  LEUKOCYTESUR NEGATIVE    Imaging results:  Dg Chest 2 View  06/04/2014   CLINICAL DATA:  Patient states she has been having pain on the right side of the body along with nausea and vomiting X 1 day. Patient denies any cough or SOB. Patient has a hx of asthma. Patient has had no surgeries on the heart or lungs. Patient is a nonsmoker. Patient was shielded for the exam.  EXAM: CHEST  2 VIEW  COMPARISON:  04/27/2009  FINDINGS: Normal heart, mediastinum and hila. Clear lungs. No pleural effusion or pneumothorax. Bony thorax is unremarkable.  IMPRESSION: Normal chest radiographs.   Electronically Signed   By: Lajean Manes M.D.   On: 06/04/2014 13:28    Assessment & Plan by Problem: Active Problems:   Migraine   Cellulitis of leg without foot, right   Sepsis   Sepsis: Patient met SIRS criteria with her fever, pulse and elevated lactic acid. WBC 14.1 with differential that points toward acute bacterial infection. The source of her infection is likely her cellulitis, which is apparent on her lymphadematous right leg. On her last admission, vancomycin is thought to have sent her into AKI. Given 2 L boluses in ED. Vital signs improving. - Continue 125 mL/hr IVF NS - Clindamycin IV - Trend lactic acid - Telemetry - Influenza pending - Droplet precautions  Cellulitis of Right Leg: Leg appears slightly erythematous and warm. It is slightly tender to the touch. Patient denies trauma to the leg, does admit to high salt diet. Recurrent problem for her. - HIV  pending - UDS pending - LE doppler right leg  Migraine Headache: Patient has headache, photophobia, nausea that are classic for her migraines. Pain improving. - Zofran (home medication) PRN - Norco for pain 1-2 tablets q4hr PRN  Right Upper Quadrant Pain with Elevated Alkaline Phosphatase:  Alk phos 118 (H) - GGT pending - RUQ [place]  GERD: On ondansetron at home  - Protonix 40 mg daily  Diet: patient requesting, regular diet   DVT Ppx: Lovenox 40 mg [place]  Dispo: Disposition is deferred at this time, awaiting improvement of current medical problems. Anticipated discharge in approximately 2-3 day(s).   The patient does not have a current PCP (No Pcp Per Patient) and does not know need an [facility] hospital follow-up appointment after discharge.  The patient does not have transportation limitations that hinder transportation to clinic appointments.  Signed: Karlene Einstein, MD 06/04/2014, 6:20 PM

## 2014-06-04 NOTE — ED Notes (Signed)
Patient transported to X-ray 

## 2014-06-04 NOTE — ED Notes (Signed)
Reports having lymphedema which causes pain to right leg. Also having headache since last night and nausea. Hx of migraines

## 2014-06-04 NOTE — ED Notes (Signed)
Admitting MD at bedside.

## 2014-06-05 ENCOUNTER — Inpatient Hospital Stay (HOSPITAL_COMMUNITY): Payer: Self-pay

## 2014-06-05 DIAGNOSIS — Q820 Hereditary lymphedema: Secondary | ICD-10-CM

## 2014-06-05 LAB — LACTIC ACID, PLASMA: LACTIC ACID, VENOUS: 2.3 mmol/L — AB (ref 0.5–2.0)

## 2014-06-05 LAB — GLUCOSE, CAPILLARY
GLUCOSE-CAPILLARY: 90 mg/dL (ref 70–99)
GLUCOSE-CAPILLARY: 81 mg/dL (ref 70–99)
GLUCOSE-CAPILLARY: 88 mg/dL (ref 70–99)
GLUCOSE-CAPILLARY: 90 mg/dL (ref 70–99)
GLUCOSE-CAPILLARY: 98 mg/dL (ref 70–99)
GLUCOSE-CAPILLARY: 81 mg/dL (ref 70–99)
Glucose-Capillary: 112 mg/dL — ABNORMAL HIGH (ref 70–99)
Glucose-Capillary: 98 mg/dL (ref 70–99)

## 2014-06-05 LAB — INFLUENZA PANEL BY PCR (TYPE A & B)
H1N1 flu by pcr: NOT DETECTED
Influenza A By PCR: NEGATIVE
Influenza B By PCR: NEGATIVE

## 2014-06-05 LAB — URINE CULTURE: Colony Count: 95000

## 2014-06-05 MED ORDER — ENOXAPARIN SODIUM 60 MG/0.6ML ~~LOC~~ SOLN
60.0000 mg | SUBCUTANEOUS | Status: DC
  Administered 2014-06-05: 60 mg via SUBCUTANEOUS
  Filled 2014-06-05 (×2): qty 0.6

## 2014-06-05 MED ORDER — ACETAMINOPHEN 325 MG PO TABS
650.0000 mg | ORAL_TABLET | Freq: Once | ORAL | Status: AC | PRN
  Administered 2014-06-05: 650 mg via ORAL
  Filled 2014-06-05: qty 2

## 2014-06-05 MED ORDER — SODIUM CHLORIDE 0.9 % IV SOLN
INTRAVENOUS | Status: DC
  Administered 2014-06-05 – 2014-06-06 (×2): via INTRAVENOUS

## 2014-06-05 MED ORDER — SODIUM CHLORIDE 0.9 % IV SOLN
INTRAVENOUS | Status: AC
  Administered 2014-06-05: 18:00:00 via INTRAVENOUS

## 2014-06-05 NOTE — Progress Notes (Addendum)
Subjective: Jacqueline Woodard continues to have symptoms of her migraine this morning in her headache, photophobia and nausea. Her right leg is also tender.   Objective: Vital signs in last 24 hours: Filed Vitals:   06/05/14 0648 06/05/14 0700 06/05/14 0745 06/05/14 0900  BP:  89/43 104/55   Pulse:  116 118   Temp: 100.6 F (38.1 C)   101.6 F (38.7 C)  TempSrc: Oral   Oral  Resp:      Height:      Weight:      SpO2:  98% 96%    Weight change:   Intake/Output Summary (Last 24 hours) at 06/05/14 1038 Last data filed at 06/05/14 0900  Gross per 24 hour  Intake 4138.75 ml  Output   1025 ml  Net 3113.75 ml   Physical Exam: Appearance: in NAD, lying in bed on the phone, lights off HEENT: AT/[place], PERRL, EOMi, no lymphadenopathy, dry MM Heart: RRR, normal S1S2 Lungs: CTAB, no wheezes Abdomen: BS+, soft, nontender today  Musculoskeletal: normal range of motion Extremities: right lower extremity lymphedema extending from knee to foot with most extensive erythema and warmth in a patch on the ventral side, trace edema in LLL as well, right hand continues to appear slightly swollen but nontender Neurologic: A&Ox3, grossly intact  Lab Results: Basic Metabolic Panel:  Recent Labs Lab 06/04/14 1414 06/04/14 1510  NA 140 138  K 4.1 4.0  CL 103 104  CO2  --  26  GLUCOSE 68* 51*  BUN 18 15  CREATININE 0.90 1.02  CALCIUM  --  9.1   Liver Function Tests:  Recent Labs Lab 06/04/14 1510  AST 28  ALT 8  ALKPHOS 118*  BILITOT 0.8  PROT 7.5  ALBUMIN 3.7   CBC:  Recent Labs Lab 06/04/14 1400 06/04/14 1414  WBC 14.1*  --   NEUTROABS 12.9*  --   HGB 12.7 14.6  HCT 41.0 43.0  MCV 80.6  --   PLT 197  --    CBG:  Recent Labs Lab 06/04/14 1912 06/05/14 0029 06/05/14 0455  GLUCAP 99 112* 90   Urine Drug Screen: Drugs of Abuse     Component Value Date/Time   LABOPIA NONE DETECTED 10/30/2008 0210   COCAINSCRNUR NONE DETECTED 10/30/2008 0210   LABBENZ NONE  DETECTED 10/30/2008 0210   AMPHETMU NONE DETECTED 10/30/2008 0210   THCU NONE DETECTED 10/30/2008 0210   LABBARB  10/30/2008 0210    NONE DETECTED        DRUG SCREEN FOR MEDICAL PURPOSES ONLY.  IF CONFIRMATION IS NEEDED FOR ANY PURPOSE, NOTIFY LAB WITHIN 5 DAYS.        LOWEST DETECTABLE LIMITS FOR URINE DRUG SCREEN Drug Class       Cutoff (ng/mL) Amphetamine      1000 Barbiturate      200 Benzodiazepine   616 Tricyclics       837 Opiates          300 Cocaine          300 THC              50    Urinalysis:  Recent Labs Lab 06/04/14 1407  COLORURINE YELLOW  LABSPEC 1.017  PHURINE 7.0  GLUCOSEU NEGATIVE  HGBUR NEGATIVE  BILIRUBINUR NEGATIVE  KETONESUR NEGATIVE  PROTEINUR NEGATIVE  UROBILINOGEN 0.2  NITRITE NEGATIVE  LEUKOCYTESUR NEGATIVE    Micro Results: Recent Results (from the past 240 hour(s))  Culture, blood (routine x 2)  Status: None (Preliminary result)   Collection Time: 06/04/14 12:56 PM  Result Value Ref Range Status   Specimen Description BLOOD RIGHT ARM  Final   Special Requests BOTTLES DRAWN AEROBIC ONLY 3 CC  Final   Culture   Final           BLOOD CULTURE RECEIVED NO GROWTH TO DATE CULTURE WILL BE HELD FOR 5 DAYS BEFORE ISSUING A FINAL NEGATIVE REPORT Performed at [facility]    Report Status PENDING  Incomplete  Culture, blood (routine x 2)     Status: None (Preliminary result)   Collection Time: 06/04/14  2:00 PM  Result Value Ref Range Status   Specimen Description BLOOD RIGHT HAND  Final   Special Requests BOTTLES DRAWN AEROBIC ONLY 4 CC  Final   Culture   Final           BLOOD CULTURE RECEIVED NO GROWTH TO DATE CULTURE WILL BE HELD FOR 5 DAYS BEFORE ISSUING A FINAL NEGATIVE REPORT Performed at [facility]    Report Status PENDING  Incomplete  MRSA PCR Screening     Status: None   Collection Time: 06/04/14  8:32 PM  Result Value Ref Range Status   MRSA by PCR NEGATIVE NEGATIVE Final    Comment:        The  GeneXpert MRSA Assay (FDA approved for NASAL specimens only), is one component of a comprehensive MRSA colonization surveillance program. It is not intended to diagnose MRSA infection nor to guide or monitor treatment for MRSA infections.    Studies/Results: Dg Chest 2 View  06/04/2014   CLINICAL DATA:  Patient states she has been having pain on the right side of the body along with nausea and vomiting X 1 day. Patient denies any cough or SOB. Patient has a hx of asthma. Patient has had no surgeries on the heart or lungs. Patient is a nonsmoker. Patient was shielded for the exam.  EXAM: CHEST  2 VIEW  COMPARISON:  04/27/2009  FINDINGS: Normal heart, mediastinum and hila. Clear lungs. No pleural effusion or pneumothorax. Bony thorax is unremarkable.  IMPRESSION: Normal chest radiographs.   Electronically Signed   By: Lajean Manes M.D.   On: 06/04/2014 13:28   US Abdomen Limited Ruq  06/05/2014   CLINICAL DATA:  Right upper quadrant abdominal pain for 1 day. Past medical history of a gastric ulcer.  EXAM: US ABDOMEN LIMITED - RIGHT UPPER QUADRANT  COMPARISON:  None.  FINDINGS: Gallbladder:  No gallstones or wall thickening visualized. No sonographic Murphy sign noted.  Common bile duct:  Diameter: 3.5 mm  Liver:  No focal lesion identified. Within normal limits in parenchymal echogenicity.  IMPRESSION: Normal right upper quadrant ultrasound.   Electronically Signed   By: Lajean Manes M.D.   On: 06/05/2014 09:07   Medications: I have reviewed the patient's current medications. Scheduled Meds: . clindamycin (CLEOCIN) IV  600 mg Intravenous 3 times per day  . enoxaparin (LOVENOX) injection  60 mg Subcutaneous Q24H  . pantoprazole  40 mg Oral Daily  . sodium chloride  3 mL Intravenous Q12H   Continuous Infusions: . sodium chloride 125 mL/hr at 06/05/14 0801   PRN Meds:.HYDROcodone-acetaminophen, ondansetron **OR** ondansetron (ZOFRAN) IV Assessment/Plan: Active Problems:   Migraine    Cellulitis of leg without foot, right   Sepsis  Jacqueline Woodard is a 29 yo woman with longstanding right lower leg edema who has had cellulitis in that same extremity in the past.  She presented in sepsis with a cellulitic-appearing right lower leg; symptoms, blood cultures, urine cultures and chest xray point toward cellulitis as the cause of her sepsis.   Cellulitis of Right Leg: Patient was initially septic with  met SIRS criteria with her fever, pulse and elevated lactic acid. WBC 14.1 with differential that points toward acute bacterial infection. The source of her infection is likely her cellulitis, which is apparent on her lymphadematous right leg. On her last admission, vancomycin is thought to have sent her into AKI. Given 2 L boluses in ED. Vital signs improving. - Continue 125 mL/hr IVF NS - Continue clindamycin IV - Telemetry - Influenza pending; not likely influenza - Droplet precautions  Chronic Hereditary Lymphedema Precox: Patient reports that her lymphedema started in [place]. Her father has lymphedema in both legs and 3 of her sisters have lymphedema in one leg. Lymphedema precox is the most common form of primary lymphedema and presents in the teenage years. It is hereditary, typically presents in one lower extremity and is more common in women. The pathogenesis is unknown, but estrogen is thought to play a role. Interestingly, familial lymphedema precox has been associated with distichiasis (a double row of eyelashes) and this subset of patients has lymphatic valve dysfunction (FOXC2 mutation). Patient reports slight worsening of her lymphedema recently. Uses compression stockings. Does not respect a low-salt diet. - Continue to elevate, compression stockings once infection/pain under control  - Infection treatment as above - HIV pending - LE doppler right leg cancelled  Migraine Headache: Patient has headache, photophobia, nausea that are classic for her migraines;  these symptoms are longer in duration than her typical. Pain improving. - Zofran (home medication) PRN - Norco for pain 1-2 tablets q4hr PRN  GERD: On ondansetron at home  - Protonix 40 mg daily  Diet: regular diet; patient did not eat much of her dinner or breakfast since arrival  DVT Ppx: Lovenox 60 mg [place] (increased given risk factors and signs that are consistent with DVT, but likely attributable to her lymphedema/cellulitis)  Dispo: Disposition is deferred at this time, awaiting improvement of current medical problems.  Anticipated discharge in approximately 2 day(s).   The patient does not have a current PCP (No Pcp Per Patient) and does not know need an [facility] hospital follow-up appointment after discharge.  The patient does not have transportation limitations that hinder transportation to clinic appointments.  .Services Needed at time of discharge: Y = Yes, Blank = No PT:   OT:   RN:   Equipment:   Other:     LOS: 1 day   Karlene Einstein, MD 06/05/2014, 10:38 AM

## 2014-06-05 NOTE — Progress Notes (Signed)
Internal Medicine Daily Progress Note  SUBJECTIVE/24 HOUR EVENTS: The patient was febrile overnight. The patient continues to feel a headache and was sensitive to light on morning rounds. Her leg right remains swollen and painful. The patient also complains of mild right hand swelling.   OBJECTIVE: Filed Vitals:   06/05/14 0648 06/05/14 0700 06/05/14 0745 06/05/14 0900  BP:  89/43 104/55   Pulse:  116 118   Temp: 100.6 F (38.1 C)   101.6 F (38.7 C)  TempSrc: Oral   Oral  Resp:      Height:      Weight:      SpO2:  98% 96%     Intake/Output Summary (Last 24 hours) at 06/05/14 0954 Last data filed at 06/05/14 0900  Gross per 24 hour  Intake 4138.75 ml  Output   1025 ml  Net 3113.75 ml    Intake/Output last 3 shifts: I/O last 3 completed shifts: In: 3893.8 [I.V.:3843.8; IV Piggyback:50] Out: 575 [Urine:575]  Medications: Scheduled Meds: . clindamycin (CLEOCIN) IV  600 mg Intravenous 3 times per day  . enoxaparin (LOVENOX) injection  40 mg Subcutaneous Q24H  . pantoprazole  40 mg Oral Daily  . sodium chloride  3 mL Intravenous Q12H   Continuous Infusions: . sodium chloride 125 mL/hr at 06/05/14 0801   PRN Meds:.HYDROcodone-acetaminophen, ondansetron **OR** ondansetron (ZOFRAN) IV  Physical Exam: Gen: Lying comfortably on bed in no acute distress. CV: RRR, no murmurs/gallops Resp: No increased work of breathing. CTA bilaterally. Ext: Right leg shows increased warmth and tenderness on an edematous, possibly erythematous, region of right calf measuring 4 x 8 cm. Left calf shows no erythema, edema, warmth, tenderness. Edematous to the left foot. No cyanosis or clubbing appreciated. Right hand is slightly swollen but nontender and nonerythematous. Psych: Alert, grossly oriented. Giving appropriate responses. Neuro: PERRL. EOM intact. Distal UE and LE strength 5/5 bilaterally. Finger to nose normal bilaterally.  Labs: Recent Labs     06/04/14  1400  06/04/14  1414   06/04/14  1510  HGB  12.7  14.6   --   HCT  41.0  43.0   --   PLT  197   --    --   NA   --   140  138  K   --   4.1  4.0  CL   --   103  104  CO2   --    --   26  BUN   --   18  15  CREATININE   --   0.90  1.02  CALCIUM   --    --   9.1    Vitals, labs, and images reviewed.  Active Problems:   Migraine   Cellulitis of leg without foot, right   Sepsis  LOS:1   ASSESSMENT AND PLAN:  In summary, Jacqueline Woodard is a 29 year old who presented with right leg pain, malaise, fever, and headache with a history of lymphedema and admissions x 2 for cellulitis of the right leg. On physical exam she was found to have an large area of erythema, edema, and warmth right calf and foot.  # SEPSIS 2/2 CELLULITIS IN THE SETTING OF RIGHT LYMPHEDEMA: She continues to be febrile, tachycardic, tachypneic and has blood pressures in the low-normal range with leukocytosis. Her lactate is most recently 2.3. Therefore, she currently septic (though not severely septic). She was previously hospitalized in 11/2013 for cellulitis and responded well to clindamycin (she was  first on vancomycin but developed AKI on it). Most likely this is cellulitis given her infectious symptoms and clinical signs consistent with the diagnosis. She denies trauma to the leg. DVT can present as tender, warm, erythematous calf but the patient is not on OCPs and was not immobilized for >3 days, therefore less likely. -- Continue IV Clindamycin 600 mg TID for cellulitis -- Blood cultures drawn 06/04/14 pending -- Influenza panel pending -- MRSA negative -- Droplet precautions  # HEADACHE WITH HISTORY OF MIGRAINES: The patient presented for headaches with photophobia, phonophobia and nausea and she felt these were similar to previous migraines. -- Zofran 30m PO or IV q6 hrs PRN for nausea -- Hydrocodone-acetaminophen 5-325 mg 1-2 tablets q4 hrs PRN for moderate pain  # ELEVATED ALK PHOSPH WITH RUQ PAIN: Borderline high. GGT within normal  limits. RUQ shows no abnormalities. -- Continue to monitor  # HYPOGLYCEMIA: Continues to be in 90s-110s. Likely due to sepsis and poor PO intake. -- Continue to monitor CBGq 4 hours  FEN/GI: -- 125 cc/hr NS for maintenance IVF -- Regular diet  ACCESS: Peripheral IV x 2  PPX: SQ Enoxaparin 60 mg daily -- HbA1c pending  CODE: Full code

## 2014-06-06 ENCOUNTER — Inpatient Hospital Stay (HOSPITAL_COMMUNITY): Payer: Self-pay

## 2014-06-06 DIAGNOSIS — L03115 Cellulitis of right lower limb: Secondary | ICD-10-CM

## 2014-06-06 DIAGNOSIS — D649 Anemia, unspecified: Secondary | ICD-10-CM

## 2014-06-06 LAB — BASIC METABOLIC PANEL
ANION GAP: 7 (ref 5–15)
BUN: 8 mg/dL (ref 6–23)
CO2: 21 mmol/L (ref 19–32)
Calcium: 7.4 mg/dL — ABNORMAL LOW (ref 8.4–10.5)
Chloride: 107 mmol/L (ref 96–112)
Creatinine, Ser: 1.03 mg/dL (ref 0.50–1.10)
GFR calc Af Amer: 84 mL/min — ABNORMAL LOW (ref >90)
GFR calc non Af Amer: 72 mL/min — ABNORMAL LOW (ref >90)
Glucose, Bld: 88 mg/dL (ref 70–99)
POTASSIUM: 3.7 mmol/L (ref 3.5–5.1)
SODIUM: 135 mmol/L (ref 135–145)

## 2014-06-06 LAB — GLUCOSE, CAPILLARY
GLUCOSE-CAPILLARY: 100 mg/dL — AB (ref 70–99)
Glucose-Capillary: 87 mg/dL (ref 70–99)
Glucose-Capillary: 74 mg/dL (ref 70–99)
Glucose-Capillary: 71 mg/dL (ref 70–99)
Glucose-Capillary: 95 mg/dL (ref 70–99)
Glucose-Capillary: 83 mg/dL (ref 70–99)

## 2014-06-06 LAB — CBC
HCT: 29.6 % — ABNORMAL LOW (ref 36.0–46.0)
HEMOGLOBIN: 9.2 g/dL — AB (ref 12.0–15.0)
MCH: 25.2 pg — AB (ref 26.0–34.0)
MCHC: 31.1 g/dL (ref 30.0–36.0)
MCV: 81.1 fL (ref 78.0–100.0)
PLATELETS: 197 10*3/uL (ref 150–400)
RBC: 3.65 MIL/uL — AB (ref 3.87–5.11)
RDW: 14.4 % (ref 11.5–15.5)
WBC: 14.4 10*3/uL — ABNORMAL HIGH (ref 4.0–10.5)

## 2014-06-06 LAB — HIV ANTIBODY (ROUTINE TESTING W REFLEX): HIV Screen 4th Generation wRfx: NONREACTIVE

## 2014-06-06 MED ORDER — ENOXAPARIN SODIUM 80 MG/0.8ML ~~LOC~~ SOLN: 80.0000 mg | SUBCUTANEOUS | Status: DC

## 2014-06-06 MED ORDER — GADOBENATE DIMEGLUMINE 529 MG/ML IV SOLN
20.0000 mL | Freq: Once | INTRAVENOUS | Status: AC | PRN
  Administered 2014-06-06: 20 mL via INTRAVENOUS

## 2014-06-06 MED ORDER — ENOXAPARIN SODIUM 60 MG/0.6ML ~~LOC~~ SOLN
60.0000 mg | SUBCUTANEOUS | Status: DC
Start: 2014-06-06 — End: 2014-06-09
  Administered 2014-06-06 – 2014-06-08 (×3): 60 mg via SUBCUTANEOUS
  Filled 2014-06-06 (×4): qty 0.6

## 2014-06-06 MED ORDER — DOCUSATE SODIUM 100 MG PO CAPS
100.0000 mg | ORAL_CAPSULE | Freq: Every day | ORAL | Status: DC | PRN
  Administered 2014-06-06 – 2014-06-07 (×2): 100 mg via ORAL
  Filled 2014-06-06 (×2): qty 1

## 2014-06-06 MED ORDER — SODIUM CHLORIDE 0.9 % IV SOLN
INTRAVENOUS | Status: AC
  Administered 2014-06-06: 75 mL/h via INTRAVENOUS

## 2014-06-06 NOTE — Progress Notes (Signed)
Internal Medicine Daily Progress Note  SUBJECTIVE/24 HOUR EVENTS: The patient had no acute events overnight. Her headache continues to come and go. She continues to have nausea with postural changes. She believes her PRN medications are improving her symptoms, however. Her swelling and pain feel the same.  OBJECTIVE: Filed Vitals:   06/06/14 0000 06/06/14 0400 06/06/14 0800 06/06/14 1200  BP: 92/57 96/65 117/70 110/76  Pulse: 110 95 113 111  Temp: 101.5 F (38.6 C) 98.9 F (37.2 C) 97.7 F (36.5 C) 102.8 F (39.3 C)  TempSrc: Oral Oral Oral Oral  Resp: _0 Height:      Weight:      SpO2: 98% 98% 99% 100%    Intake/Output Summary (Last 24 hours) at 06/06/14 1407 Last data filed at 06/06/14 1325  Gross per 24 hour  Intake 3301.67 ml  Output   1775 ml  Net 1526.67 ml    Intake/Output last 3 shifts: I/O last 3 completed shifts: In: 6710.4 [P.O.:700; I.V.:5810.4; IV Piggyback:200] Out: 1850 [Urine:1850]  Medications: Scheduled Meds: . clindamycin (CLEOCIN) IV  600 mg Intravenous 3 times per day  . enoxaparin (LOVENOX) injection  60 mg Subcutaneous Q24H  . pantoprazole  40 mg Oral Daily  . sodium chloride  3 mL Intravenous Q12H   Continuous Infusions: . sodium chloride 75 mL/hr (06/06/14 1313)   PRN Meds:.docusate sodium, HYDROcodone-acetaminophen, ondansetron **OR** ondansetron (ZOFRAN) IV  Physical Exam: Gen: Lying comfortably on bed in no acute distress. CV: RRR, no murmurs/gallops. Resp: No increased work of breathing. CTA bilaterally. Ext: Right leg has increased warmth and tenderness on an edematous, possibly erythematous, region of right calf measuring 4 x 8 cm, Right leg tense. Left lower extremity mildly edematous without tenderness or warmth. No cyanosis or clubbing appreciated. Right hand is slightly swollen but nontender and nonerythematous. Psych: Alert, grossly oriented. Giving appropriate responses.  Labs: Recent Labs     06/04/14  1400    06/04/14  1414  06/04/14  1510  06/06/14  0330  HGB  12.7   --   14.6   --   9.2*  HCT  41.0   --   43.0   --   29.6*  PLT  197   --    --    --   197  NA   --    < >  140  138  135  K   --    < >  4.1  4.0  3.7  CL   --    < >  103  104  107  CO2   --    --    --   26  21  BUN   --    < >  _1 CREATININE   --    < >  0.90  1.02  1.03  CALCIUM   --    --    --   9.1  7.4*   < > = values in this interval not displayed.    Vitals, labs, and images reviewed.  Principal Problem:   Sepsis Active Problems:   Migraine   Cellulitis of leg without foot, right  LOS:2   ASSESSMENT AND PLAN:  In summary, Jacqueline Woodard is a 29 year old who presented with right leg pain, malaise, fever, and headache with a history of lymphedema and admissions x 2 for cellulitis of the right leg. On physical exam she was found  to have an large area of erythema, edema, and warmth right calf and foot. She has had negative blood and urine cultures to date and requires inpatient hospitalization for treatment of sepsis secondary to cellulitis.  # SEPSIS 2/2 CELLULITIS IN THE SETTING OF RIGHT LYMPHEDEMA: Ill but stable. She continues to be febrile and tachycardic with leukocytosis but has normal RR and BP. Her lactate is most recently 2.3. Therefore, she currently septic but not severely septic. She was previously hospitalized in 11/2013 for cellulitis and responded well to clindamycin (she was first on vancomycin but developed AKI on it). Most likely this is cellulitis given that her infectious symptoms and clinical signs consistent with the diagnosis. She denies trauma to the leg. DVT can present as tender, warm, erythematous calf but the patient is not on OCPs and has been more immobilized recently. Her tense left lower extremity is concerning for clot and abscess. -- Continue IV Clindamycin 600 mg TID for cellulitis -- Obtain MRI with contrast of the right lower extremity -- Blood cultures drawn 06/04/14  negative to date -- Influenza panel negative -- MRSA negative  # HEADACHE WITH HISTORY OF MIGRAINES: The patient presented for headaches with photophobia, phonophobia and nausea and she felt these were similar to previous migraines. -- Zofran 55m PO or IV q6 hrs PRN for nausea -- Hydrocodone-acetaminophen 5-325 mg 1-2 tablets q4 hrs PRN for moderate pain  # ELEVATED ALK PHOSPH WITH RUQ PAIN: Borderline high. GGT within normal limits. RUQ shows no abnormalities. -- Continue to monitor  # HYPOGLYCEMIA: Continues to be in 90s-110s. Likely due to sepsis and poor PO intake. -- Continue to monitor CBG q4 hours  # HYPOCALCEMIA: 7.4 on 3/21 with baseline at 9. Corrected calcium is 7.6. -- Oral supplement today  # ANEMIA: Likely dilutional given that she has had a net increase of 5 L since admission. -- Decrease IVF -- Continue to monitor  FEN/GI: -- 75 cc/hr NS for maintenance IVF -- Regular diet  ACCESS: Peripheral IV x 2  PPX: SQ Enoxaparin 60 mg daily  CODE: Full code

## 2014-06-06 NOTE — Progress Notes (Signed)
Subjective: [name] continues to have pain in her right lower extremity and feels that her swelling is about the same as it was yesterday. She has had some headache and some nausea, which are returning now.   Interval Events:  - Patient had a fever to 103.1 overnight; overnight MD not notified, no blood cultures drawn  Objective: Vital signs in last 24 hours: Filed Vitals:   06/05/14 1933 06/05/14 1959 06/06/14 0000 06/06/14 0400  BP: 114/61 114/67 92/57 96/65   Pulse: 120 120 110 95  Temp: 103.1 F (39.5 C) 102.6 F (39.2 C) 101.5 F (38.6 C) 98.9 F (37.2 C)  TempSrc: Oral Oral Oral Oral  Resp: 30 33 26 17  Height:      Weight:      SpO2: 98% 98% 98% 98%   Weight change:   Intake/Output Summary (Last 24 hours) at 06/06/14 0714 Last data filed at 06/06/14 0600  Gross per 24 hour  Intake 3616.67 ml  Output   1275 ml  Net 2341.67 ml   Physical Exam: Appearance: in NAD, lying in bed resting, lights off HEENT: AT/[place], PERRL, EOMi, no lymphadenopathy, false eyelashes in place OU, dry MM Heart: RRR, normal S1S2 Lungs: CTAB, no wheezes Abdomen: BS+, soft, nontender today  Musculoskeletal: normal range of motion Extremities: right lower extremity lymphedema extending from knee to foot with most extensive erythema and warmth in a patch on the ventral side, more tense than prior exams, nonfluctuant, trace nontender edema in left ventral foot as well Neurologic: A&Ox3, grossly intact  Lab Results: Basic Metabolic Panel:  Recent Labs Lab 06/04/14 1510 06/06/14 0330  NA 138 135  K 4.0 3.7  CL 104 107  CO2 26 21  GLUCOSE 51* 88  BUN 15 8  CREATININE 1.02 1.03  CALCIUM 9.1 7.4*   Liver Function Tests:  Recent Labs Lab 06/04/14 1510  AST 28  ALT 8  ALKPHOS 118*  BILITOT 0.8  PROT 7.5  ALBUMIN 3.7   CBC:  Recent Labs Lab 06/04/14 1400 06/04/14 1414 06/06/14 0330  WBC 14.1*  --  14.4*  NEUTROABS 12.9*  --   --   HGB 12.7 14.6 9.2*  HCT 41.0 43.0  29.6*  MCV 80.6  --  81.1  PLT 197  --  197   CBG:  Recent Labs Lab 06/05/14 1753 06/05/14 2001 06/05/14 2113 06/05/14 2301 06/06/14 0013 06/06/14 0417  GLUCAP 90 98 98 81 100* 87   Urine Drug Screen: Drugs of Abuse     Component Value Date/Time   LABOPIA NONE DETECTED 10/30/2008 0210   COCAINSCRNUR NONE DETECTED 10/30/2008 0210   LABBENZ NONE DETECTED 10/30/2008 0210   AMPHETMU NONE DETECTED 10/30/2008 0210   THCU NONE DETECTED 10/30/2008 0210   LABBARB  10/30/2008 0210    NONE DETECTED        DRUG SCREEN FOR MEDICAL PURPOSES ONLY.  IF CONFIRMATION IS NEEDED FOR ANY PURPOSE, NOTIFY LAB WITHIN 5 DAYS.        LOWEST DETECTABLE LIMITS FOR URINE DRUG SCREEN Drug Class       Cutoff (ng/mL) Amphetamine      1000 Barbiturate      200 Benzodiazepine   762 Tricyclics       831 Opiates          300 Cocaine          300 THC              50    Urinalysis:  Recent Labs Lab 06/04/14 1407  COLORURINE YELLOW  LABSPEC 1.017  PHURINE 7.0  GLUCOSEU NEGATIVE  HGBUR NEGATIVE  BILIRUBINUR NEGATIVE  KETONESUR NEGATIVE  PROTEINUR NEGATIVE  UROBILINOGEN 0.2  NITRITE NEGATIVE  LEUKOCYTESUR NEGATIVE    Micro Results: Recent Results (from the past 240 hour(s))  Culture, blood (routine x 2)     Status: None (Preliminary result)   Collection Time: 06/04/14 12:56 PM  Result Value Ref Range Status   Specimen Description BLOOD RIGHT ARM  Final   Special Requests BOTTLES DRAWN AEROBIC ONLY 3 CC  Final   Culture   Final           BLOOD CULTURE RECEIVED NO GROWTH TO DATE CULTURE WILL BE HELD FOR 5 DAYS BEFORE ISSUING A FINAL NEGATIVE REPORT Performed at [facility]    Report Status PENDING  Incomplete  Culture, blood (routine x 2)     Status: None (Preliminary result)   Collection Time: 06/04/14  2:00 PM  Result Value Ref Range Status   Specimen Description BLOOD RIGHT HAND  Final   Special Requests BOTTLES DRAWN AEROBIC ONLY 4 CC  Final   Culture   Final            BLOOD CULTURE RECEIVED NO GROWTH TO DATE CULTURE WILL BE HELD FOR 5 DAYS BEFORE ISSUING A FINAL NEGATIVE REPORT Performed at [facility]    Report Status PENDING  Incomplete  Urine culture     Status: None   Collection Time: 06/04/14  2:07 PM  Result Value Ref Range Status   Specimen Description URINE, RANDOM  Final   Special Requests NONE  Final   Colony Count   Final    95,000 COLONIES/ML Performed at [facility]    Culture   Final    Multiple bacterial morphotypes present, none predominant. Suggest appropriate recollection if clinically indicated. Performed at [facility]    Report Status 06/05/2014 FINAL  Final  MRSA PCR Screening     Status: None   Collection Time: 06/04/14  8:32 PM  Result Value Ref Range Status   MRSA by PCR NEGATIVE NEGATIVE Final    Comment:        The GeneXpert MRSA Assay (FDA approved for NASAL specimens only), is one component of a comprehensive MRSA colonization surveillance program. It is not intended to diagnose MRSA infection nor to guide or monitor treatment for MRSA infections.    Studies/Results: Dg Chest 2 View  06/04/2014   CLINICAL DATA:  Patient states she has been having pain on the right side of the body along with nausea and vomiting X 1 day. Patient denies any cough or SOB. Patient has a hx of asthma. Patient has had no surgeries on the heart or lungs. Patient is a nonsmoker. Patient was shielded for the exam.  EXAM: CHEST  2 VIEW  COMPARISON:  04/27/2009  FINDINGS: Normal heart, mediastinum and hila. Clear lungs. No pleural effusion or pneumothorax. Bony thorax is unremarkable.  IMPRESSION: Normal chest radiographs.   Electronically Signed   By: Lajean Manes M.D.   On: 06/04/2014 13:28   US Abdomen Limited Ruq  06/05/2014   CLINICAL DATA:  Right upper quadrant abdominal pain for 1 day. Past medical history of a gastric ulcer.  EXAM: US ABDOMEN LIMITED - RIGHT UPPER QUADRANT  COMPARISON:  None.  FINDINGS:  Gallbladder:  No gallstones or wall thickening visualized. No sonographic Murphy sign noted.  Common bile duct:  Diameter: 3.5 mm  Liver:  No focal lesion identified. Within normal limits in parenchymal echogenicity.  IMPRESSION: Normal right upper quadrant ultrasound.   Electronically Signed   By: Lajean Manes M.D.   On: 06/05/2014 09:07   Medications: I have reviewed the patient's current medications. Scheduled Meds: . clindamycin (CLEOCIN) IV  600 mg Intravenous 3 times per day  . enoxaparin (LOVENOX) injection  60 mg Subcutaneous Q24H  . pantoprazole  40 mg Oral Daily  . sodium chloride  3 mL Intravenous Q12H   Continuous Infusions: . sodium chloride 125 mL/hr at 06/06/14 0419   PRN Meds:.HYDROcodone-acetaminophen, ondansetron **OR** ondansetron (ZOFRAN) IV Assessment/Plan: Active Problems:   Migraine   Cellulitis of leg without foot, right   Sepsis  Ms. Denitra Donaghey is a 29 yo woman with longstanding right lower leg edema who has had cellulitis in that same extremity in the past. She presented in sepsis with a cellulitic-appearing right lower leg; symptoms, blood cultures, urine cultures and chest xray point toward cellulitis as the cause of her sepsis. She continues to have febrile episodes and her cellulitis does not appear to have started to respond to the antibiotics.  Cellulitis of Right Leg: Patient was initially septic and met SIRS criteria with her fever, pulse, elevated lactic acid and WBC 14.1 with differential that pointed toward acute bacterial infection. The source of her infection is likely her cellulitis, which is apparent on her lymphadematous right leg. On her last admission, vancomycin is thought to have sent her into AKI. Given 2 L boluses in ED and now on maintenance fluids. Flu negative. Cellulitis often expands before it responds to antibiotics on exam. Will continue to observe on IV antibiotics today. Similar tenderness and warmth on exam today, but leg appears  tighter.  - IVF NS now reduced to 75 mL/hr (patient is eating a small amount, continues to have dry mucous membranes, but also has the edema) - Continue clindamycin IV - Telemetry - Droplet precautions  Chronic Hereditary Lymphedema Precox: Patient reports that her lymphedema started in [place]. Her father has lymphedema in both legs and 3 of her sisters have lymphedema in one leg. Lymphedema precox is the most common form of primary lymphedema and presents in the teenage years. It is hereditary, typically presents in one lower extremity and is more common in women. The pathogenesis is unknown, but estrogen is thought to play a role. Interestingly, familial lymphedema precox has been associated with distichiasis (a double row of eyelashes) and this subset of patients has lymphatic valve dysfunction (FOXC2 mutation). Patient reports slight worsening of her lymphedema recently. Uses compression stockings. Does not respect a low-salt diet. No history of distichiasis. Leg appears tighter on exam today, concern for compartment syndrome. - Continue to elevate, compression stockings once infection/pain under control; prophylactic penicillin has been shown effective in lowering recurrence, but effectiveness much less convincing in patients with BMI > 33 - Infection treatment as above - HIV pending - LE doppler right leg   Migraine Headache: Patient has headache, photophobia, nausea that are classic for her migraines; these symptoms are longer in duration than her typical. Pain improving. - Zofran (home medication) PRN - Norco for pain 1-2 tablets q4hr PRN  Anemia: Likely dilutional at 9.2 (baseline ~12).  - Continue to monitor   Hypocalcemia: 7.4 (baseline is ~9). Corrected calcium 7.6. - Supplement today  GERD: On ondansetron at home  - Protonix 40 mg daily  Diet: regular diet; patient patient is eating some food  DVT Ppx: Lovenox  60 mg [place] (increased given risk factors and signs that are  consistent with DVT, but likely attributable to her lymphedema/cellulitis)  Dispo: Disposition is deferred at this time, awaiting improvement of current medical problems.  Anticipated discharge in approximately 1-2 day(s).   The patient does not have a current PCP (No Pcp Per Patient) and does not know need an [facility] hospital follow-up appointment after discharge.  The patient does not have transportation limitations that hinder transportation to clinic appointments.  .Services Needed at time of discharge: Y = Yes, Blank = No PT:   OT:   RN:   Equipment:   Other:     LOS: 2 days   Karlene Einstein, MD 06/06/2014, 7:14 AM

## 2014-06-06 NOTE — Progress Notes (Signed)
*  PRELIMINARY RESULTS* Vascular Ultrasound Right lower extremity venous duplex has been completed.  Preliminary findings: negative for DVT in [place], [place], RVT  06/06/2014, 11:39 AM

## 2014-06-06 NOTE — Progress Notes (Signed)
Utilization Review Completed.  

## 2014-06-07 LAB — CBC WITH DIFFERENTIAL/PLATELET
Basophils Absolute: 0 10*3/uL (ref 0.0–0.1)
Basophils Relative: 0 % (ref 0–1)
EOS PCT: 1 % (ref 0–5)
Eosinophils Absolute: 0.1 10*3/uL (ref 0.0–0.7)
HEMATOCRIT: 29.8 % — AB (ref 36.0–46.0)
HEMOGLOBIN: 9.3 g/dL — AB (ref 12.0–15.0)
LYMPHS ABS: 1.8 10*3/uL (ref 0.7–4.0)
Lymphocytes Relative: 18 % (ref 12–46)
MCH: 24.7 pg — AB (ref 26.0–34.0)
MCHC: 31.2 g/dL (ref 30.0–36.0)
MCV: 79 fL (ref 78.0–100.0)
MONO ABS: 0.9 10*3/uL (ref 0.1–1.0)
Monocytes Relative: 9 % (ref 3–12)
Neutro Abs: 7.2 10*3/uL (ref 1.7–7.7)
Neutrophils Relative %: 72 % (ref 43–77)
Platelets: 207 10*3/uL (ref 150–400)
RBC: 3.77 MIL/uL — AB (ref 3.87–5.11)
RDW: 14.4 % (ref 11.5–15.5)
WBC: 10 10*3/uL (ref 4.0–10.5)

## 2014-06-07 LAB — GLUCOSE, CAPILLARY
GLUCOSE-CAPILLARY: 80 mg/dL (ref 70–99)
GLUCOSE-CAPILLARY: 74 mg/dL (ref 70–99)
GLUCOSE-CAPILLARY: 95 mg/dL (ref 70–99)
Glucose-Capillary: 80 mg/dL (ref 70–99)
Glucose-Capillary: 80 mg/dL (ref 70–99)
Glucose-Capillary: 116 mg/dL — ABNORMAL HIGH (ref 70–99)

## 2014-06-07 LAB — HEMOGLOBIN A1C
Hgb A1c MFr Bld: 6.1 % — ABNORMAL HIGH (ref 4.8–5.6)
Mean Plasma Glucose: 128 mg/dL

## 2014-06-07 MED ORDER — CALCIUM CARBONATE ANTACID 500 MG PO CHEW
1.0000 | CHEWABLE_TABLET | Freq: Two times a day (BID) | ORAL | Status: DC
  Administered 2014-06-07 – 2014-06-11 (×8): 200 mg via ORAL
  Filled 2014-06-07 (×10): qty 1

## 2014-06-07 MED ORDER — DEXTROSE-NACL 5-0.9 % IV SOLN
INTRAVENOUS | Status: DC
  Administered 2014-06-07: 75 mL/h via INTRAVENOUS
  Administered 2014-06-08 – 2014-06-09 (×2): via INTRAVENOUS

## 2014-06-07 NOTE — Progress Notes (Signed)
Internal Medicine Daily Progress Note  SUBJECTIVE/24 HOUR EVENTS: Patient spiked a fever overnight of 102F but has come down recently. She continues to experience headaches with photophobia. She does not feel like her swelling has changed and has not gotten out of bed due to her nausea.  OBJECTIVE: Filed Vitals:   06/07/14 0700 06/07/14 0800 06/07/14 0804 06/07/14 0900  BP: 105/50  105/50 124/85  Pulse: 96 104 104 93  Temp:   99.7 F (37.6 C)   TempSrc:   Oral   Resp: 33 _0 Height:      Weight:      SpO2: 97% 100%  99%    Intake/Output Summary (Last 24 hours) at 06/07/14 1050 Last data filed at 06/07/14 1040  Gross per 24 hour  Intake   1723 ml  Output   1050 ml  Net    673 ml    Intake/Output last 3 shifts: I/O last 3 completed shifts: In: 3561.7 [P.O.:270; I.V.:3091.7; IV Piggyback:200] Out: 2125 [Urine:2125]  Medications: Scheduled Meds: . clindamycin (CLEOCIN) IV  600 mg Intravenous 3 times per day  . enoxaparin (LOVENOX) injection  60 mg Subcutaneous Q24H  . pantoprazole  40 mg Oral Daily  . sodium chloride  3 mL Intravenous Q12H   Continuous Infusions: . sodium chloride 75 mL/hr at 06/06/14 2024   PRN Meds:.docusate sodium, HYDROcodone-acetaminophen, ondansetron **OR** ondansetron (ZOFRAN) IV  Physical Exam: Gen: Lying comfortably on bed in no acute distress. Drowsy today. CV: RRR, no murmurs/gallops. Resp: No increased work of breathing. CTA bilaterally. Ext: Right leg is warm and edematous, decreased from yesterday. Erythema mild of right leg mild. Patient tolerates palpation, but remains tender. Left LE shows no erythema but foot is edematous. Psych: Drowsy, grossly oriented. Giving appropriate responses.  Labs: Recent Labs     06/04/14  1510  06/06/14  0330  06/07/14  0750  HGB   --   9.2*  9.3*  HCT   --   29.6*  29.8*  PLT   --   197  207  NA  138  135   --   K  4.0  3.7   --   CL  104  107   --   CO2  26  21   --   BUN  15  8   --    CREATININE  1.02  1.03   --   CALCIUM  9.1  7.4*   --     Vitals, labs, and images reviewed.  Principal Problem:   Sepsis Active Problems:   Migraine   Cellulitis of leg without foot, right  LOS:3   ASSESSMENT AND PLAN:  In summary, Jacqueline Woodard is a 29 year old who presented with right leg pain, malaise, fever, and headache with a history of lymphedema and admissions x 2 for cellulitis of the right leg. On physical exam she was found to have an large area of erythema, edema, and warmth right calf and foot. She has had negative blood and urine cultures to date and requires inpatient hospitalization for treatment of sepsis secondary to cellulitis.  # SEPSIS 2/2 CELLULITIS IN THE SETTING OF RIGHT LYMPHEDEMA: She was previously hospitalized in 11/2013 for cellulitis and responded well to clindamycin (she was first on vancomycin but developed AKI on it). Most likely this is cellulitis given that her infectious symptoms and clinical signs consistent with the diagnosis. She denies trauma to the leg. DVT can present as tender, warm, erythematous calf  but the patient is not on OCPs and has been more immobilized recently. Today, swelling and tenderness are improving despite the fever. Her MRI showed no signs of abscess and her lower extremity duplex u/s showed no clot. If the patient worsens overnight, we will consider adding more coverage. She is does not have healthcare associated infection, immunosuppression, nor prior Pseudomonas cellutis, so [facility] recommends Unasyn or ertapenem.  -- Continue IV Clindamycin 600 mg TID for cellulitis -- Blood cultures drawn 06/04/14 negative to date -- Influenza panel negative -- MRSA negative  # HEADACHE WITH HISTORY OF MIGRAINES: The patient presented for headaches with photophobia, phonophobia and nausea and she felt these were similar to previous migraines. -- Zofran 32m PO or IV q6 hrs PRN for nausea -- Hydrocodone-acetaminophen 5-325 mg 1-2 tablets q4  hrs PRN for moderate pain  # ELEVATED ALK PHOSPH WITH RUQ PAIN: Borderline high. GGT within normal limits. RUQ shows no abnormalities. -- Continue to monitor  # HYPOGLYCEMIA: Continues to be in 90s-110s. Likely due to sepsis and poor PO intake. -- Continue to monitor CBG q4 hours  # HYPOCALCEMIA: 7.4 on 3/21 with baseline at 9. Corrected calcium is 7.6. -- Oral supplement today  # ANEMIA: Likely dilutional given that she has had a net increase of 5 L since admission. -- Decrease IVF -- Continue to monitor  FEN/GI: -- 75 cc/hr NS for maintenance IVF -- Regular diet  ACCESS: Peripheral IV x 2  PPX: SQ Enoxaparin 60 mg daily  CODE: Full code

## 2014-06-07 NOTE — Progress Notes (Signed)
Subjective: Jacqueline Woodard continues to have pain in her right lower extremity, but notes that it has lessened. She continues to have headache and nausea.  Interval Events:  - Patient had a fever to 102.1 overnight; blood cultures drawn  Objective: Vital signs in last 24 hours: Filed Vitals:   06/07/14 0700 06/07/14 0800 06/07/14 0804 06/07/14 0900  BP: 105/50  105/50 124/85  Pulse: 96 104 104 93  Temp:   99.7 F (37.6 C)   TempSrc:   Oral   Resp: 33 21 18 20   Height:      Weight:      SpO2: 97% 100%  99%   Weight change:   Intake/Output Summary (Last 24 hours) at 06/07/14 1141 Last data filed at 06/07/14 1040  Gross per 24 hour  Intake   1598 ml  Output   1050 ml  Net    548 ml   Physical Exam: Appearance: in NAD, lying in bed resting, lights off HEENT: AT/[place], PERRL, EOMi, no lymphadenopathy, false eyelashes in place OU, dry MM Heart: RRR, normal S1S2 Lungs: CTAB, no wheezes Abdomen: BS+, soft, nontender today  Musculoskeletal: normal range of motion Extremities: right lower extremity lymphedema extending from knee to foot with most extensive erythema and warmth on the ventral side, less tender than on yesterday's exam, nonfluctuant, trace nontender edema in left ventral foot as well Neurologic: A&Ox3, grossly intact  Lab Results: Basic Metabolic Panel:  Recent Labs Lab 06/04/14 1510 06/06/14 0330  NA 138 135  K 4.0 3.7  CL 104 107  CO2 26 21  GLUCOSE 51* 88  BUN 15 8  CREATININE 1.02 1.03  CALCIUM 9.1 7.4*   Liver Function Tests:  Recent Labs Lab 06/04/14 1510  AST 28  ALT 8  ALKPHOS 118*  BILITOT 0.8  PROT 7.5  ALBUMIN 3.7   CBC:  Recent Labs Lab 06/04/14 1400  06/06/14 0330 06/07/14 0750  WBC 14.1*  --  14.4* 10.0  NEUTROABS 12.9*  --   --  7.2  HGB 12.7  < > 9.2* 9.3*  HCT 41.0  < > 29.6* 29.8*  MCV 80.6  --  81.1 79.0  PLT 197  --  197 207  < > = values in this interval not displayed. CBG:  Recent Labs Lab 06/06/14 1155  06/06/14 1600 06/06/14 2019 06/07/14 0020 06/07/14 0431 06/07/14 0802  GLUCAP 71 95 83 80 80 74   Urine Drug Screen: Drugs of Abuse     Component Value Date/Time   LABOPIA NONE DETECTED 10/30/2008 0210   COCAINSCRNUR NONE DETECTED 10/30/2008 0210   LABBENZ NONE DETECTED 10/30/2008 0210   AMPHETMU NONE DETECTED 10/30/2008 0210   THCU NONE DETECTED 10/30/2008 0210   LABBARB  10/30/2008 0210    NONE DETECTED        DRUG SCREEN FOR MEDICAL PURPOSES ONLY.  IF CONFIRMATION IS NEEDED FOR ANY PURPOSE, NOTIFY LAB WITHIN 5 DAYS.        LOWEST DETECTABLE LIMITS FOR URINE DRUG SCREEN Drug Class       Cutoff (ng/mL) Amphetamine      1000 Barbiturate      200 Benzodiazepine   622 Tricyclics       633 Opiates          300 Cocaine          300 THC              50    Urinalysis:  Recent Labs Lab 06/04/14 1407  COLORURINE YELLOW  LABSPEC 1.017  PHURINE 7.0  GLUCOSEU NEGATIVE  HGBUR NEGATIVE  BILIRUBINUR NEGATIVE  KETONESUR NEGATIVE  PROTEINUR NEGATIVE  UROBILINOGEN 0.2  NITRITE NEGATIVE  LEUKOCYTESUR NEGATIVE    Micro Results: Recent Results (from the past 240 hour(s))  Culture, blood (routine x 2)     Status: None (Preliminary result)   Collection Time: 06/04/14 12:56 PM  Result Value Ref Range Status   Specimen Description BLOOD RIGHT ARM  Final   Special Requests BOTTLES DRAWN AEROBIC ONLY 3 CC  Final   Culture   Final           BLOOD CULTURE RECEIVED NO GROWTH TO DATE CULTURE WILL BE HELD FOR 5 DAYS BEFORE ISSUING A FINAL NEGATIVE REPORT Performed at [facility]    Report Status PENDING  Incomplete  Culture, blood (routine x 2)     Status: None (Preliminary result)   Collection Time: 06/04/14  2:00 PM  Result Value Ref Range Status   Specimen Description BLOOD RIGHT HAND  Final   Special Requests BOTTLES DRAWN AEROBIC ONLY 4 CC  Final   Culture   Final           BLOOD CULTURE RECEIVED NO GROWTH TO DATE CULTURE WILL BE HELD FOR 5 DAYS BEFORE ISSUING A  FINAL NEGATIVE REPORT Performed at [facility]    Report Status PENDING  Incomplete  Urine culture     Status: None   Collection Time: 06/04/14  2:07 PM  Result Value Ref Range Status   Specimen Description URINE, RANDOM  Final   Special Requests NONE  Final   Colony Count   Final    95,000 COLONIES/ML Performed at [facility]    Culture   Final    Multiple bacterial morphotypes present, none predominant. Suggest appropriate recollection if clinically indicated. Performed at [facility]    Report Status 06/05/2014 FINAL  Final  MRSA PCR Screening     Status: None   Collection Time: 06/04/14  8:32 PM  Result Value Ref Range Status   MRSA by PCR NEGATIVE NEGATIVE Final    Comment:        The GeneXpert MRSA Assay (FDA approved for NASAL specimens only), is one component of a comprehensive MRSA colonization surveillance program. It is not intended to diagnose MRSA infection nor to guide or monitor treatment for MRSA infections.    Studies/Results: Mr Tibia Fibula Right W Wo Contrast  06/07/2014   CLINICAL DATA:  Right leg pain, malaise, fever. Right leg cellulitis.  EXAM: MRI OF LOWER RIGHT EXTREMITY WITHOUT AND WITH CONTRAST  TECHNIQUE: Multiplanar, multisequence MR imaging of the right lower leg was performed both before and after administration of intravenous contrast.  CONTRAST:  4mL MULTIHANCE GADOBENATE DIMEGLUMINE 529 MG/ML IV SOLN  COMPARISON:  None.  FINDINGS: No marrow signal abnormality. No fracture or dislocation. No periostitis or bone destruction.  There is subcutaneous edema throughout the right lower leg most severe along the anterior aspect with enhancement most consistent with cellulitis. There is no focal fluid collection to suggest an abscess.  The muscles are normal in signal. The muscles enhance normally and homogeneously.  IMPRESSION: 1. Findings most consistent with right lower leg cellulitis without a drainable fluid collection to  suggest an abscess.   Electronically Signed   By: Kathreen Devoid   On: 06/07/2014 08:07   Medications: I have reviewed the patient's current medications. Scheduled Meds: . clindamycin (CLEOCIN) IV  600 mg Intravenous  3 times per day  . enoxaparin (LOVENOX) injection  60 mg Subcutaneous Q24H  . pantoprazole  40 mg Oral Daily  . sodium chloride  3 mL Intravenous Q12H   Continuous Infusions: . dextrose 5 % and 0.9% NaCl     PRN Meds:.docusate sodium, HYDROcodone-acetaminophen, ondansetron **OR** ondansetron (ZOFRAN) IV Assessment/Plan: Principal Problem:   Sepsis Active Problems:   Migraine   Cellulitis of leg without foot, right  [name] is a 29 yo woman with longstanding right lower leg edema who has had cellulitis in that same extremity in the past. She presented in sepsis with a cellulitic-appearing right lower leg; symptoms, negative blood cultures, negative urine cultures and normal chest xray point toward cellulitis as the cause of her sepsis. She continues to have febrile episodes (though these are down-trending) and her leg tenderness is slightly improved on exam for the first time today. Doppler studies were performed to rule out right lower extremity DVT and were negative; an MRI of the right lower extremity was done over concern for compartment syndrome and was negative (for compartment syndrome or abscess).  Cellulitis of Right Leg: Patient was initially septic with an elevated fever, pulse, lactic acid and WBC 14.1 with differential that pointed toward acute bacterial infection. The source of her infection is likely her cellulitis, which is apparent on her lymphadematous right leg. On her last admission, vancomycin is thought to have sent her into AKI. Flu negative. Cellulitis often expands before it responds to antibiotics on exam. Improved tenderness on exam today. White count downtrending 14.4-->10. - IVF dextrose/NS at 75 mL/hr (patient is eating a small amount, but  continues to have dry mucous membranes and CBGs in the 70s) - Continue clindamycin IV; if fever returns today, will broaden coverage - Telemetry  Chronic Hereditary Lymphedema Precox: Patient reports that her lymphedema started in [place]. Her father has lymphedema in both legs and 3 of her sisters have lymphedema in one leg. Lymphedema precox is the most common form of primary lymphedema and presents in the teenage years. It is hereditary, typically presents in one lower extremity and is more common in women. The pathogenesis is unknown, but estrogen is thought to play a role. Interestingly, familial lymphedema precox has been associated with distichiasis (a double row of eyelashes) and this subset of patients has lymphatic valve dysfunction (FOXC2 mutation). Patient reports slight worsening of her lymphedema recently. Uses compression stockings. Does not respect a low-salt diet. No history of distichiasis. Leg appears tighter on exam today, concern for compartment syndrome. HIV nonreactive.  - Continue to elevate, compression stockings once infection/pain under control; prophylactic penicillin has been shown effective in lowering recurrence, but effectiveness much less convincing in patients with BMI > 33 who have lymphedema or multiple cellulitis occurrences - Infection treatment as above  Migraine Headache: Patient has headache, photophobia, nausea that are classic for her migraines; these symptoms are longer in duration than her typical.  - Zofran (home medication) PRN - Norco for pain 1-2 tablets q4hr PRN  Anemia: Likely dilutional at 9.3 (baseline ~12), stable.  - Continue to monitor   Hypocalcemia: 7.4 (baseline is ~9). Corrected calcium 7.6. - Supplement today with calcium carbonate 1000 mg daily  GERD: On ondansetron at home  - Protonix 40 mg daily  Diet: regular diet; patient patient is eating some food  DVT Ppx: Lovenox 60 mg [employer] (increased given risk factors and signs that  are consistent with DVT, but likely attributable to her lymphedema/cellulitis)  Dispo:  Disposition is deferred at this time, awaiting improvement of current medical problems.  Anticipated discharge in approximately 1-2 day(s).   The patient does not have a current PCP (No Pcp Per Patient) and does not know need an [facility] hospital follow-up appointment after discharge.  The patient does not have transportation limitations that hinder transportation to clinic appointments.  .Services Needed at time of discharge: Y = Yes, Blank = No PT:   OT:   RN:   Equipment:   Other:     LOS: 3 days   Karlene Einstein, MD 06/07/2014, 11:41 AM

## 2014-06-07 NOTE — Progress Notes (Signed)
Patient ID: Jacqueline Woodard, female   DOB: [birth year], 29 y.o.   MRN: [id number] Medicine attending: Persistent hectic temperatures up to 102.2. Slightly decreased pain on palpation of the right lower extremity which remains warm to touch and edematous. Venous Doppler study did not show any clot. MRI of the right calf did not show any evidence for abscess or a vascular compartment syndrome. Day #3 clindamycin. We will continue to monitor temperature curve and clinical symptoms closely. If she has persistent high fever over the next 24 hours, we will extend antibiotic coverage.

## 2014-06-08 DIAGNOSIS — E876 Hypokalemia: Secondary | ICD-10-CM

## 2014-06-08 LAB — BASIC METABOLIC PANEL
Anion gap: 5 (ref 5–15)
BUN: <5 mg/dL — ABNORMAL LOW (ref 6–23)
CO2: 25 mmol/L (ref 19–32)
Calcium: 8 mg/dL — ABNORMAL LOW (ref 8.4–10.5)
Chloride: 105 mmol/L (ref 96–112)
Creatinine, Ser: 0.84 mg/dL (ref 0.50–1.10)
GFR calc Af Amer: >90 mL/min (ref >90)
GLUCOSE: 113 mg/dL — AB (ref 70–99)
POTASSIUM: 3.1 mmol/L — AB (ref 3.5–5.1)
Sodium: 135 mmol/L (ref 135–145)

## 2014-06-08 LAB — CBC
HEMATOCRIT: 30 % — AB (ref 36.0–46.0)
Hemoglobin: 9.4 g/dL — ABNORMAL LOW (ref 12.0–15.0)
MCH: 24.5 pg — ABNORMAL LOW (ref 26.0–34.0)
MCHC: 31.3 g/dL (ref 30.0–36.0)
MCV: 78.3 fL (ref 78.0–100.0)
Platelets: 228 10*3/uL (ref 150–400)
RBC: 3.83 MIL/uL — AB (ref 3.87–5.11)
RDW: 14.4 % (ref 11.5–15.5)
WBC: 7.5 10*3/uL (ref 4.0–10.5)

## 2014-06-08 LAB — GLUCOSE, CAPILLARY
GLUCOSE-CAPILLARY: 112 mg/dL — AB (ref 70–99)
GLUCOSE-CAPILLARY: 117 mg/dL — AB (ref 70–99)
GLUCOSE-CAPILLARY: 134 mg/dL — AB (ref 70–99)
Glucose-Capillary: 99 mg/dL (ref 70–99)
Glucose-Capillary: 94 mg/dL (ref 70–99)
Glucose-Capillary: 100 mg/dL — ABNORMAL HIGH (ref 70–99)

## 2014-06-08 MED ORDER — POTASSIUM CHLORIDE CRYS ER 20 MEQ PO TBCR
40.0000 meq | EXTENDED_RELEASE_TABLET | Freq: Two times a day (BID) | ORAL | Status: AC
  Administered 2014-06-08 – 2014-06-09 (×3): 40 meq via ORAL
  Filled 2014-06-08 (×3): qty 2

## 2014-06-08 NOTE — Progress Notes (Signed)
Patient ID: Jacqueline Woodard, female   DOB: 05-Oct-1985, 29 y.o.   MRN: [id number] Medicine attending: I examined this patient this morning together with resident physician Dr. Drucilla Schmidt and I concur with her evaluation and management plan. Temperature curve finally trending down day #4 single agent clindamycin. White blood count down. Decrease pain and heat right lower extremity. In view of chronic lymphedema and acute cellulitis, I feel ongoing parenteral antibiotics for minimum 7 days required for adequate penetration of the infected skin before transitioning to oral antibiotics.

## 2014-06-08 NOTE — Progress Notes (Addendum)
Subjective: Jacqueline Woodard feels better today. Her headache and nausea have continued to occasionally occur, but for the most part, these have improved. Her leg pain is no longer bothering her. She feels tired. She sat up in bed yesterday, but did not make it to her chair. Improved appetite.  Interval Events:  - Patient's fever curve down-trending (peaked at 100.4 yesterday), last temperature >100.5 was 30 hours ago  Objective: Vital signs in last 24 hours: Filed Vitals:   06/07/14 1943 06/07/14 2000 06/08/14 0000 06/08/14 0400  BP: 143/99 138/82 106/68 118/90  Pulse: 93 54 97 84  Temp: 99 F (37.2 C)  98.7 F (37.1 C) 99.2 F (37.3 C)  TempSrc: Oral  Oral Oral  Resp:  22 18 18   Height:      Weight:      SpO2: 100% 100% 95% 100%   Weight change:   Intake/Output Summary (Last 24 hours) at 06/08/14 0734 Last data filed at 06/08/14 0600  Gross per 24 hour  Intake   2018 ml  Output   1550 ml  Net    468 ml   Physical Exam: Appearance: in NAD, lying in bed resting, lights off HEENT: AT/[place], PERRL, EOMi, no lymphadenopathy, false eyelashes in place OU, dry MM Heart: RRR, normal S1S2 Lungs: CTAB, no wheezes Abdomen: BS+, soft, nontender today  Musculoskeletal: normal range of motion Extremities: right lower extremity lymphedema extending from knee to foot with decreased warmth and erythema, much less tender than on prior exams, nonfluctuant Neurologic: A&Ox3, grossly intact  Lab Results: Basic Metabolic Panel:  Recent Labs Lab 06/06/14 0330 06/08/14 0430  NA 135 135  K 3.7 3.1*  CL 107 105  CO2 21 25  GLUCOSE 88 113*  BUN 8 <5*  CREATININE 1.03 0.84  CALCIUM 7.4* 8.0*   Liver Function Tests:  Recent Labs Lab 06/04/14 1510  AST 28  ALT 8  ALKPHOS 118*  BILITOT 0.8  PROT 7.5  ALBUMIN 3.7   CBC:  Recent Labs Lab 06/04/14 1400  06/07/14 0750 06/08/14 0430  WBC 14.1*  < > 10.0 7.5  NEUTROABS 12.9*  --  7.2  --   HGB 12.7  < > 9.3* 9.4*  HCT 41.0  < >  29.8* 30.0*  MCV 80.6  < > 79.0 78.3  PLT 197  < > 207 228  < > = values in this interval not displayed. CBG:  Recent Labs Lab 06/07/14 0802 06/07/14 1140 06/07/14 1554 06/07/14 1941 06/08/14 0030 06/08/14 0428  GLUCAP 74 80 116* 95 117* 112*   Urine Drug Screen: Drugs of Abuse     Component Value Date/Time   LABOPIA NONE DETECTED 10/30/2008 0210   COCAINSCRNUR NONE DETECTED 10/30/2008 0210   LABBENZ NONE DETECTED 10/30/2008 0210   AMPHETMU NONE DETECTED 10/30/2008 0210   THCU NONE DETECTED 10/30/2008 0210   LABBARB  10/30/2008 0210    NONE DETECTED        DRUG SCREEN FOR MEDICAL PURPOSES ONLY.  IF CONFIRMATION IS NEEDED FOR ANY PURPOSE, NOTIFY LAB WITHIN 5 DAYS.        LOWEST DETECTABLE LIMITS FOR URINE DRUG SCREEN Drug Class       Cutoff (ng/mL) Amphetamine      1000 Barbiturate      200 Benzodiazepine   779 Tricyclics       390 Opiates          300 Cocaine          300 THC  50    Urinalysis:  Recent Labs Lab 06/04/14 1407  COLORURINE YELLOW  LABSPEC 1.017  PHURINE 7.0  GLUCOSEU NEGATIVE  HGBUR NEGATIVE  BILIRUBINUR NEGATIVE  KETONESUR NEGATIVE  PROTEINUR NEGATIVE  UROBILINOGEN 0.2  NITRITE NEGATIVE  LEUKOCYTESUR NEGATIVE    Micro Results: Recent Results (from the past 240 hour(s))  Culture, blood (routine x 2)     Status: None (Preliminary result)   Collection Time: 06/04/14 12:56 PM  Result Value Ref Range Status   Specimen Description BLOOD RIGHT ARM  Final   Special Requests BOTTLES DRAWN AEROBIC ONLY 3 CC  Final   Culture   Final           BLOOD CULTURE RECEIVED NO GROWTH TO DATE CULTURE WILL BE HELD FOR 5 DAYS BEFORE ISSUING A FINAL NEGATIVE REPORT Performed at [facility]    Report Status PENDING  Incomplete  Culture, blood (routine x 2)     Status: None (Preliminary result)   Collection Time: 06/04/14  2:00 PM  Result Value Ref Range Status   Specimen Description BLOOD RIGHT HAND  Final   Special Requests  BOTTLES DRAWN AEROBIC ONLY 4 CC  Final   Culture   Final           BLOOD CULTURE RECEIVED NO GROWTH TO DATE CULTURE WILL BE HELD FOR 5 DAYS BEFORE ISSUING A FINAL NEGATIVE REPORT Performed at [facility]    Report Status PENDING  Incomplete  Urine culture     Status: None   Collection Time: 06/04/14  2:07 PM  Result Value Ref Range Status   Specimen Description URINE, RANDOM  Final   Special Requests NONE  Final   Colony Count   Final    95,000 COLONIES/ML Performed at [facility]    Culture   Final    Multiple bacterial morphotypes present, none predominant. Suggest appropriate recollection if clinically indicated. Performed at [facility]    Report Status 06/05/2014 FINAL  Final  MRSA PCR Screening     Status: None   Collection Time: 06/04/14  8:32 PM  Result Value Ref Range Status   MRSA by PCR NEGATIVE NEGATIVE Final    Comment:        The GeneXpert MRSA Assay (FDA approved for NASAL specimens only), is one component of a comprehensive MRSA colonization surveillance program. It is not intended to diagnose MRSA infection nor to guide or monitor treatment for MRSA infections.    Studies/Results: Mr Tibia Fibula Right W Wo Contrast  06/07/2014   CLINICAL DATA:  Right leg pain, malaise, fever. Right leg cellulitis.  EXAM: MRI OF LOWER RIGHT EXTREMITY WITHOUT AND WITH CONTRAST  TECHNIQUE: Multiplanar, multisequence MR imaging of the right lower leg was performed both before and after administration of intravenous contrast.  CONTRAST:  24mL MULTIHANCE GADOBENATE DIMEGLUMINE 529 MG/ML IV SOLN  COMPARISON:  None.  FINDINGS: No marrow signal abnormality. No fracture or dislocation. No periostitis or bone destruction.  There is subcutaneous edema throughout the right lower leg most severe along the anterior aspect with enhancement most consistent with cellulitis. There is no focal fluid collection to suggest an abscess.  The muscles are normal in signal. The  muscles enhance normally and homogeneously.  IMPRESSION: 1. Findings most consistent with right lower leg cellulitis without a drainable fluid collection to suggest an abscess.   Electronically Signed   By: Kathreen Devoid   On: 06/07/2014 08:07   Medications: I have reviewed the patient's  current medications. Scheduled Meds: . calcium carbonate  1 tablet Oral BID  . clindamycin (CLEOCIN) IV  600 mg Intravenous 3 times per day  . enoxaparin (LOVENOX) injection  60 mg Subcutaneous Q24H  . pantoprazole  40 mg Oral Daily  . potassium chloride  40 mEq Oral BID  . sodium chloride  3 mL Intravenous Q12H   Continuous Infusions: . dextrose 5 % and 0.9% NaCl 75 mL/hr (06/07/14 1206)   PRN Meds:.docusate sodium, HYDROcodone-acetaminophen, ondansetron **OR** ondansetron (ZOFRAN) IV Assessment/Plan: Principal Problem:   Sepsis Active Problems:   Migraine   Cellulitis of leg without foot, right  Jacqueline Woodard is a 29 yo woman with longstanding right lower leg edema who has had cellulitis in that same extremity in the past. She presented in sepsis with a cellulitic-appearing right lower leg; symptoms, negative blood cultures, negative urine cultures and normal chest xray point toward cellulitis as the cause of her sepsis. She continues to have febrile episodes (though these are down-trending) and her leg tenderness is slightly improved on exam for the first time today. Doppler studies were performed to rule out right lower extremity DVT and were negative; an MRI of the right lower extremity was done over concern for compartment syndrome and was negative (for compartment syndrome or abscess). Patient is showing improvement in her white count, fever curve and symptoms.  Improving Cellulitis of Right Leg: Patient was initially septic with an elevated fever, pulse, lactic acid and WBC 14.1 with differential that pointed toward acute bacterial infection. The source of her infection is likely her cellulitis,  which is apparent on her lymphadematous right leg. On her last admission, vancomycin is thought to have sent her into AKI. Flu negative. Cellulitis appears to be improving (decreased warmth and tenderness). White count downtrending 14.4-->10-->7.5. - IVF dextrose/NS at 75 mL/hr (patient is eating now, but continues to have dry mucous membranes and CBGs right around 100 with dextrose on board at this slow rate) - Continue clindamycin IV; plan for IV coverage for a total of 7 days (to end on 3/26) - Transfer patient to med-surg  Chronic Hereditary Lymphedema Precox: Patient reports that her lymphedema started around puberty. Her father and sister have lymphedema in both legs and 3 of her sisters and paternal aunt have lymphedema in one leg. For all of the women, the lymphedema started during puberty. Lymphedema precox is the most common form of primary lymphedema and presents in the teenage years. It is hereditary, typically presents in one lower extremity and is more common in women. The pathogenesis is unknown, but estrogen is thought to play a role. Interestingly, familial lymphedema precox has been associated with distichiasis (a double row of eyelashes) and this subset of patients has lymphatic valve dysfunction (FOXC2 mutation). The patient has false eyelashes, but when asked, reports no eyelash abnormalities in the past. For her lymphedema, she uses compression stockings. Does not respect a low-salt diet. Reports recent increase in size of her leg. HIV nonreactive.  - Continue to elevate, use compression stockings once infection/pain under control; prophylactic penicillin has been shown effective in lowering recurrence, but effectiveness much less convincing in patients with BMI > 33 who have lymphedema or multiple cellulitis occurrences - Infection treatment as above  Improving Migraine Headache: Patient had headache, photophobia, nausea that are classic for her migraines; these symptoms are longer in  duration than her typical and are subsiding. - Zofran (home medication) PRN - Norco for pain 1-2 tablets q4hr PRN  Anemia: Likely  dilutional at 9.4 (baseline ~12), stable.  - Continue to monitor   Hypocalcemia: 7.4 (baseline is ~9). Corrected calcium 7.6-->8.0. - Supplementing with calcium carbonate 1000 mg daily  Hypokalemia: 3.1 - Supplementing with kdur 40 mg BID for 3 doses  GERD: On ondansetron at home  - Protonix 40 mg daily  Diet: regular diet; patient patient reports that she has been eating  DVT Ppx: Lovenox 60 mg [place] (increased given risk factors and signs that are consistent with DVT, but likely attributable to her lymphedema/cellulitis)  Dispo: Disposition is deferred at this time, awaiting improvement of current medical problems.  Anticipated discharge in approximately 3 day(s).   The patient does not have a current PCP (No Pcp Per Patient) and does not know need an [facility] hospital follow-up appointment after discharge.  The patient does not have transportation limitations that hinder transportation to clinic appointments.  .Services Needed at time of discharge: Y = Yes, Blank = No PT:   OT:   RN:   Equipment:   Other:     LOS: 4 days   Karlene Einstein, MD 06/08/2014, 7:34 AM

## 2014-06-09 DIAGNOSIS — K5900 Constipation, unspecified: Secondary | ICD-10-CM

## 2014-06-09 DIAGNOSIS — B9689 Other specified bacterial agents as the cause of diseases classified elsewhere: Secondary | ICD-10-CM

## 2014-06-09 DIAGNOSIS — IMO0001 Reserved for inherently not codable concepts without codable children: Secondary | ICD-10-CM | POA: Insufficient documentation

## 2014-06-09 DIAGNOSIS — R509 Fever, unspecified: Secondary | ICD-10-CM

## 2014-06-09 DIAGNOSIS — M79661 Pain in right lower leg: Secondary | ICD-10-CM

## 2014-06-09 LAB — GLUCOSE, CAPILLARY
GLUCOSE-CAPILLARY: 83 mg/dL (ref 70–99)
GLUCOSE-CAPILLARY: 109 mg/dL — AB (ref 70–99)
GLUCOSE-CAPILLARY: 70 mg/dL (ref 70–99)
GLUCOSE-CAPILLARY: 78 mg/dL (ref 70–99)
GLUCOSE-CAPILLARY: 119 mg/dL — AB (ref 70–99)
Glucose-Capillary: 104 mg/dL — ABNORMAL HIGH (ref 70–99)

## 2014-06-09 MED ORDER — POLYETHYLENE GLYCOL 3350 17 G PO PACK
17.0000 g | PACK | Freq: Every day | ORAL | Status: DC
  Administered 2014-06-09: 17 g via ORAL
  Filled 2014-06-09 (×3): qty 1

## 2014-06-09 MED ORDER — ENOXAPARIN SODIUM 60 MG/0.6ML ~~LOC~~ SOLN
50.0000 mg | SUBCUTANEOUS | Status: DC
  Administered 2014-06-09 – 2014-06-10 (×2): 50 mg via SUBCUTANEOUS
  Filled 2014-06-09 (×2): qty 0.6

## 2014-06-09 MED ORDER — MINERAL OIL RE ENEM
1.0000 | ENEMA | Freq: Once | RECTAL | Status: AC
Start: 2014-06-09 — End: 2014-06-09
  Administered 2014-06-09: 1 via RECTAL
  Filled 2014-06-09: qty 1

## 2014-06-09 MED ORDER — DOCUSATE SODIUM 100 MG PO CAPS
100.0000 mg | ORAL_CAPSULE | Freq: Every day | ORAL | Status: DC
  Administered 2014-06-09: 100 mg via ORAL
  Filled 2014-06-09 (×3): qty 1

## 2014-06-09 NOTE — Progress Notes (Signed)
Subjective: Jacqueline Woodard feels better today. Her leg pain is no longer bothering her. When she first wakes up and when she walks to the bathroom, she has a headache and nausea. Otherwise, her migraine symptoms have subsided. She is eating well.  Interval Events:  - Patient's fever curve peaked at 100.9 at 6AM  Objective: Vital signs in last 24 hours: Filed Vitals:   06/08/14 1600 06/08/14 2213 06/09/14 0213 06/09/14 0624  BP: 105/71 99/66 122/79 125/81  Pulse: 91 92 90 85  Temp:  99.7 F (37.6 C) 99 F (37.2 C) 100.9 F (38.3 C)  TempSrc:  Oral    Resp: 23 19 17 16   Height:      Weight:      SpO2: 97% 95% 94% 93%   Weight change:   Intake/Output Summary (Last 24 hours) at 06/09/14 0914 Last data filed at 06/09/14 0600  Gross per 24 hour  Intake   1500 ml  Output    850 ml  Net    650 ml   Physical Exam: Appearance: in NAD, lying in bed resting, lights off HEENT: AT/[place], PERRL, EOMi, no lymphadenopathy, false eyelashes in place OU, dry MM Heart: RRR, normal S1S2 Lungs: CTAB, no wheezes Abdomen: BS+, soft, nontender today  Musculoskeletal: normal range of motion Extremities: right lower extremity lymphedema extending from knee to foot with decreased warmth and erythema, much less tender than on prior exams, nonfluctuant, tenderness to palpation of right calf Neurologic: A&Ox3, grossly intact  Lab Results: Basic Metabolic Panel:  Recent Labs Lab 06/06/14 0330 06/08/14 0430  NA 135 135  K 3.7 3.1*  CL 107 105  CO2 21 25  GLUCOSE 88 113*  BUN 8 <5*  CREATININE 1.03 0.84  CALCIUM 7.4* 8.0*   Liver Function Tests:  Recent Labs Lab 06/04/14 1510  AST 28  ALT 8  ALKPHOS 118*  BILITOT 0.8  PROT 7.5  ALBUMIN 3.7   CBC:  Recent Labs Lab 06/04/14 1400  06/07/14 0750 06/08/14 0430  WBC 14.1*  < > 10.0 7.5  NEUTROABS 12.9*  --  7.2  --   HGB 12.7  < > 9.3* 9.4*  HCT 41.0  < > 29.8* 30.0*  MCV 80.6  < > 79.0 78.3  PLT 197  < > 207 228  < > = values  in this interval not displayed. CBG:  Recent Labs Lab 06/08/14 1244 06/08/14 1609 06/08/14 2011 06/09/14 0006 06/09/14 0412 06/09/14 0746  GLUCAP 94 134* 100* 104* 83 109*   Urine Drug Screen: Drugs of Abuse     Component Value Date/Time   LABOPIA NONE DETECTED 10/30/2008 0210   COCAINSCRNUR NONE DETECTED 10/30/2008 0210   LABBENZ NONE DETECTED 10/30/2008 0210   AMPHETMU NONE DETECTED 10/30/2008 0210   THCU NONE DETECTED 10/30/2008 0210   LABBARB  10/30/2008 0210    NONE DETECTED        DRUG SCREEN FOR MEDICAL PURPOSES ONLY.  IF CONFIRMATION IS NEEDED FOR ANY PURPOSE, NOTIFY LAB WITHIN 5 DAYS.        LOWEST DETECTABLE LIMITS FOR URINE DRUG SCREEN Drug Class       Cutoff (ng/mL) Amphetamine      1000 Barbiturate      200 Benzodiazepine   505 Tricyclics       397 Opiates          300 Cocaine          300 THC  50    Urinalysis:  Recent Labs Lab 06/04/14 1407  COLORURINE YELLOW  LABSPEC 1.017  PHURINE 7.0  GLUCOSEU NEGATIVE  HGBUR NEGATIVE  BILIRUBINUR NEGATIVE  KETONESUR NEGATIVE  PROTEINUR NEGATIVE  UROBILINOGEN 0.2  NITRITE NEGATIVE  LEUKOCYTESUR NEGATIVE    Micro Results: Recent Results (from the past 240 hour(s))  Culture, blood (routine x 2)     Status: None (Preliminary result)   Collection Time: 06/04/14 12:56 PM  Result Value Ref Range Status   Specimen Description BLOOD RIGHT ARM  Final   Special Requests BOTTLES DRAWN AEROBIC ONLY 3 CC  Final   Culture   Final           BLOOD CULTURE RECEIVED NO GROWTH TO DATE CULTURE WILL BE HELD FOR 5 DAYS BEFORE ISSUING A FINAL NEGATIVE REPORT Performed at [facility]    Report Status PENDING  Incomplete  Culture, blood (routine x 2)     Status: None (Preliminary result)   Collection Time: 06/04/14  2:00 PM  Result Value Ref Range Status   Specimen Description BLOOD RIGHT HAND  Final   Special Requests BOTTLES DRAWN AEROBIC ONLY 4 CC  Final   Culture   Final           BLOOD  CULTURE RECEIVED NO GROWTH TO DATE CULTURE WILL BE HELD FOR 5 DAYS BEFORE ISSUING A FINAL NEGATIVE REPORT Performed at [facility]    Report Status PENDING  Incomplete  Urine culture     Status: None   Collection Time: 06/04/14  2:07 PM  Result Value Ref Range Status   Specimen Description URINE, RANDOM  Final   Special Requests NONE  Final   Colony Count   Final    95,000 COLONIES/ML Performed at [facility]    Culture   Final    Multiple bacterial morphotypes present, none predominant. Suggest appropriate recollection if clinically indicated. Performed at [facility]    Report Status 06/05/2014 FINAL  Final  MRSA PCR Screening     Status: None   Collection Time: 06/04/14  8:32 PM  Result Value Ref Range Status   MRSA by PCR NEGATIVE NEGATIVE Final    Comment:        The GeneXpert MRSA Assay (FDA approved for NASAL specimens only), is one component of a comprehensive MRSA colonization surveillance program. It is not intended to diagnose MRSA infection nor to guide or monitor treatment for MRSA infections.   Culture, blood (routine x 2)     Status: None (Preliminary result)   Collection Time: 06/07/14  1:31 AM  Result Value Ref Range Status   Specimen Description BLOOD RIGHT ARM  Final   Special Requests BOTTLES DRAWN AEROBIC AND ANAEROBIC 10CC  Final   Culture   Final           BLOOD CULTURE RECEIVED NO GROWTH TO DATE CULTURE WILL BE HELD FOR 5 DAYS BEFORE ISSUING A FINAL NEGATIVE REPORT Note: Culture results may be compromised due to an excessive volume of blood received in culture bottles. Performed at [facility]    Report Status PENDING  Incomplete  Culture, blood (routine x 2)     Status: None (Preliminary result)   Collection Time: 06/07/14  1:34 AM  Result Value Ref Range Status   Specimen Description BLOOD LEFT ARM  Final   Special Requests BOTTLES DRAWN AEROBIC AND ANAEROBIC 5CC  Final   Culture   Final  BLOOD  CULTURE RECEIVED NO GROWTH TO DATE CULTURE WILL BE HELD FOR 5 DAYS BEFORE ISSUING A FINAL NEGATIVE REPORT Performed at [facility]    Report Status PENDING  Incomplete   Studies/Results: No results found. Medications: I have reviewed the patient's current medications. Scheduled Meds: . calcium carbonate  1 tablet Oral BID  . clindamycin (CLEOCIN) IV  600 mg Intravenous 3 times per day  . docusate sodium  100 mg Oral Daily  . enoxaparin (LOVENOX) injection  60 mg Subcutaneous Q24H  . pantoprazole  40 mg Oral Daily  . polyethylene glycol  17 g Oral Daily  . potassium chloride  40 mEq Oral BID  . sodium chloride  3 mL Intravenous Q12H   Continuous Infusions: . dextrose 5 % and 0.9% NaCl 75 mL/hr at 06/09/14 0554   PRN Meds:.HYDROcodone-acetaminophen, ondansetron **OR** ondansetron (ZOFRAN) IV Assessment/Plan: Principal Problem:   Sepsis Active Problems:   Migraine   Cellulitis of leg without foot, right  Jacqueline Woodard is a 29 yo woman with longstanding right lower leg edema who has had cellulitis in that same extremity in the past. She presented in sepsis with a cellulitic-appearing right lower leg; symptoms, negative blood cultures, negative urine cultures and normal chest xray point toward cellulitis as the cause of her sepsis. She continues to have febrile episodes (though these are down-trending) and her leg tenderness is slightly improved on exam for the first time today. Doppler studies were performed to rule out right lower extremity DVT and were negative; an MRI of the right lower extremity was done and was negative for abscess. Patient is showing improvement in her white count, general fever curve and symptoms.  Improving Cellulitis of Right Leg: Patient was initially septic with an elevated fever, pulse, lactic acid and WBC 14.1 with differential that pointed toward acute bacterial infection. The source of her infection is likely her cellulitis, which is apparent  on her lymphadematous right leg. On her last admission, vancomycin is thought to have sent her into AKI. Flu negative. Cellulitis appears to be improving (decreased warmth and tenderness). White count downtrending 14.4-->10-->7.5. Blood cultures negative. - Stopped IVF; patient is eating well - Continue clindamycin IV; plan for IV coverage for a total of 7 days (to end on 3/26)  Chronic Hereditary Lymphedema Precox: Patient reports that her lymphedema started around puberty. Her father and sister have lymphedema in both legs and 3 of her sisters and paternal aunt have lymphedema in one leg. For all of the women, the lymphedema started during puberty. Lymphedema precox is the most common form of primary lymphedema and presents in the teenage years. It is hereditary, typically presents in one lower extremity and is more common in women. The pathogenesis is unknown, but estrogen is thought to play a role. Interestingly, familial lymphedema precox has been associated with distichiasis (a double row of eyelashes) and this subset of patients has lymphatic valve dysfunction (FOXC2 mutation). The patient has false eyelashes, but when asked, reports no eyelash abnormalities in the past. For her lymphedema, she uses compression stockings at home. Does not respect a low-salt diet. Reports recent increase in size of her leg. HIV nonreactive.  - Continue to elevate, use compression stockings once infection/pain under control; prophylactic penicillin has been shown effective in lowering recurrence, but effectiveness much less convincing in patients with BMI > 33 who have lymphedema or multiple cellulitis occurrences - Infection treatment as above  Calf Pain: Patient is at risk for blood clot, so  her daily lovenox [place] dose was increased to 60 mg. New calf pain today, no increased warmth or erythema.  - Continue to monitor - Continue 60 mg lovenox [place] prophylaxis  Improving Migraine Headache: Patient had headache,  photophobia, nausea that are classic for her migraines; these symptoms are longer in duration than her typical and are subsiding. - Zofran (home medication) PRN - Norco for pain 1-2 tablets q4hr PRN  Constipation: Patient reports that last BM 3/18. - Colace PRN - Added Miralax today  Anemia: Likely dilutional, last hemoglobin 9.4 (baseline ~12), stable.  - Continue to monitor  - CBC in am  Hypocalcemia: 7.4 (baseline is ~9). Corrected calcium 7.6-->8.0. - Supplementing with calcium carbonate 1000 mg daily  Hypokalemia: 3.1 - Supplementing with kdur 40 mg BID for 3 doses - BMET in am  GERD: On ondansetron at home  - Protonix 40 mg daily  Diet: regular diet  DVT Ppx: Lovenox 60 mg [place] (increased given risk factors and signs that are consistent with DVT, but likely attributable to her lymphedema/cellulitis)  Dispo: Disposition is deferred at this time, awaiting improvement of current medical problems.  Anticipated discharge in approximately 3 day(s).   The patient does not have a current PCP (No Pcp Per Patient) and does not know need an [facility] hospital follow-up appointment after discharge.  The patient does not have transportation limitations that hinder transportation to clinic appointments.  .Services Needed at time of discharge: Y = Yes, Blank = No PT:   OT:   RN:   Equipment:   Other:     LOS: 5 days    Karlene Einstein, MD 06/09/2014, 9:14 AM

## 2014-06-10 DIAGNOSIS — M79669 Pain in unspecified lower leg: Secondary | ICD-10-CM

## 2014-06-10 LAB — CULTURE, BLOOD (ROUTINE X 2)
CULTURE: NO GROWTH
Culture: NO GROWTH

## 2014-06-10 LAB — BASIC METABOLIC PANEL
ANION GAP: 4 — AB (ref 5–15)
BUN: 6 mg/dL (ref 6–23)
CO2: 25 mmol/L (ref 19–32)
Calcium: 8.3 mg/dL — ABNORMAL LOW (ref 8.4–10.5)
Chloride: 105 mmol/L (ref 96–112)
Creatinine, Ser: 0.8 mg/dL (ref 0.50–1.10)
GFR calc Af Amer: >90 mL/min (ref >90)
Glucose, Bld: 86 mg/dL (ref 70–99)
Potassium: 3.5 mmol/L (ref 3.5–5.1)
SODIUM: 134 mmol/L — AB (ref 135–145)

## 2014-06-10 LAB — CBC
HCT: 29.4 % — ABNORMAL LOW (ref 36.0–46.0)
Hemoglobin: 9.3 g/dL — ABNORMAL LOW (ref 12.0–15.0)
MCH: 24.5 pg — ABNORMAL LOW (ref 26.0–34.0)
MCHC: 31.6 g/dL (ref 30.0–36.0)
MCV: 77.4 fL — ABNORMAL LOW (ref 78.0–100.0)
Platelets: 338 10*3/uL (ref 150–400)
RBC: 3.8 MIL/uL — ABNORMAL LOW (ref 3.87–5.11)
RDW: 14.3 % (ref 11.5–15.5)
WBC: 11.2 10*3/uL — ABNORMAL HIGH (ref 4.0–10.5)

## 2014-06-10 LAB — GLUCOSE, CAPILLARY
GLUCOSE-CAPILLARY: 91 mg/dL (ref 70–99)
GLUCOSE-CAPILLARY: 100 mg/dL — AB (ref 70–99)
Glucose-Capillary: 82 mg/dL (ref 70–99)
Glucose-Capillary: 126 mg/dL — ABNORMAL HIGH (ref 70–99)

## 2014-06-10 MED ORDER — HYDROCODONE-ACETAMINOPHEN 5-325 MG PO TABS: 1.0000 | ORAL_TABLET | Freq: Four times a day (QID) | ORAL | Status: DC | PRN

## 2014-06-10 MED ORDER — ACETAMINOPHEN 325 MG PO TABS
650.0000 mg | ORAL_TABLET | Freq: Three times a day (TID) | ORAL | Status: DC | PRN
  Administered 2014-06-10 – 2014-06-11 (×3): 650 mg via ORAL
  Filled 2014-06-10 (×3): qty 2

## 2014-06-10 MED ORDER — LINEZOLID 600 MG PO TABS
600.0000 mg | ORAL_TABLET | Freq: Two times a day (BID) | ORAL | Status: DC
  Administered 2014-06-10 – 2014-06-11 (×2): 600 mg via ORAL
  Filled 2014-06-10 (×5): qty 1

## 2014-06-10 NOTE — Progress Notes (Signed)
Subjective: Ms. Hjort feels much better. She had a BM last night. She is in no pain and believes her leg is its normal size.  Interval Events:  - Patient's fever curve peaked at 101.6 at 6:30 PM yesterday, now afebrile  Objective: Vital signs in last 24 hours: Filed Vitals:   06/09/14 0624 06/09/14 1833 06/09/14 2102 06/10/14 0533  BP: 125/81 129/80 108/58 111/59  Pulse: 85 103 102 105  Temp: 100.9 F (38.3 C) 101.6 F (38.7 C) 98.8 F (37.1 C) 100 F (37.8 C)  TempSrc:  Oral Oral Oral  Resp: 16 16 18 19   Height:      Weight:      SpO2: 93% 99% 99% 98%   Weight change:   Intake/Output Summary (Last 24 hours) at 06/10/14 0738 Last data filed at 06/10/14 0535  Gross per 24 hour  Intake    470 ml  Output      0 ml  Net    470 ml   Physical Exam: Appearance: in NAD, lying in bed resting, lights on, friend, Jo-Jo, at bedside HEENT: AT/[place], PERRL, EOMi, no lymphadenopathy, false eyelashes slightly detached OU, dry MM Heart: RRR, normal S1S2 Lungs: CTAB, no wheezes Abdomen: BS+, soft, nontender today  Musculoskeletal: normal range of motion Extremities: right lower extremity lymphedema extending from knee to foot with no warmth or erythema, nontender, nonfluctuant, no calf tenderness, skin wrinkling Neurologic: A&Ox3, grossly intact  Lab Results: Basic Metabolic Panel:  Recent Labs Lab 06/08/14 0430 06/10/14 0407  NA 135 134*  K 3.1* 3.5  CL 105 105  CO2 25 25  GLUCOSE 113* 86  BUN <5* 6  CREATININE 0.84 0.80  CALCIUM 8.0* 8.3*   Liver Function Tests:  Recent Labs Lab 06/04/14 1510  AST 28  ALT 8  ALKPHOS 118*  BILITOT 0.8  PROT 7.5  ALBUMIN 3.7   CBC:  Recent Labs Lab 06/04/14 1400  06/07/14 0750 06/08/14 0430 06/10/14 0407  WBC 14.1*  < > 10.0 7.5 11.2*  NEUTROABS 12.9*  --  7.2  --   --   HGB 12.7  < > 9.3* 9.4* 9.3*  HCT 41.0  < > 29.8* 30.0* 29.4*  MCV 80.6  < > 79.0 78.3 77.4*  PLT 197  < > 207 228 338  < > = values in this  interval not displayed. CBG:  Recent Labs Lab 06/09/14 0412 06/09/14 0746 06/09/14 1156 06/09/14 1806 06/09/14 2149 06/10/14 0718  GLUCAP 83 109* 70 78 119* 91   Urine Drug Screen: Drugs of Abuse     Component Value Date/Time   LABOPIA NONE DETECTED 10/30/2008 0210   COCAINSCRNUR NONE DETECTED 10/30/2008 0210   LABBENZ NONE DETECTED 10/30/2008 0210   AMPHETMU NONE DETECTED 10/30/2008 0210   THCU NONE DETECTED 10/30/2008 0210   LABBARB  10/30/2008 0210    NONE DETECTED        DRUG SCREEN FOR MEDICAL PURPOSES ONLY.  IF CONFIRMATION IS NEEDED FOR ANY PURPOSE, NOTIFY LAB WITHIN 5 DAYS.        LOWEST DETECTABLE LIMITS FOR URINE DRUG SCREEN Drug Class       Cutoff (ng/mL) Amphetamine      1000 Barbiturate      200 Benzodiazepine   697 Tricyclics       948 Opiates          300 Cocaine          300 THC  50    Urinalysis:  Recent Labs Lab 06/04/14 1407  COLORURINE YELLOW  LABSPEC 1.017  PHURINE 7.0  GLUCOSEU NEGATIVE  HGBUR NEGATIVE  BILIRUBINUR NEGATIVE  KETONESUR NEGATIVE  PROTEINUR NEGATIVE  UROBILINOGEN 0.2  NITRITE NEGATIVE  LEUKOCYTESUR NEGATIVE    Micro Results: Recent Results (from the past 240 hour(s))  Culture, blood (routine x 2)     Status: None (Preliminary result)   Collection Time: 06/04/14 12:56 PM  Result Value Ref Range Status   Specimen Description BLOOD RIGHT ARM  Final   Special Requests BOTTLES DRAWN AEROBIC ONLY 3 CC  Final   Culture   Final           BLOOD CULTURE RECEIVED NO GROWTH TO DATE CULTURE WILL BE HELD FOR 5 DAYS BEFORE ISSUING A FINAL NEGATIVE REPORT Performed at [facility]    Report Status PENDING  Incomplete  Culture, blood (routine x 2)     Status: None (Preliminary result)   Collection Time: 06/04/14  2:00 PM  Result Value Ref Range Status   Specimen Description BLOOD RIGHT HAND  Final   Special Requests BOTTLES DRAWN AEROBIC ONLY 4 CC  Final   Culture   Final           BLOOD CULTURE  RECEIVED NO GROWTH TO DATE CULTURE WILL BE HELD FOR 5 DAYS BEFORE ISSUING A FINAL NEGATIVE REPORT Performed at [facility]    Report Status PENDING  Incomplete  Urine culture     Status: None   Collection Time: 06/04/14  2:07 PM  Result Value Ref Range Status   Specimen Description URINE, RANDOM  Final   Special Requests NONE  Final   Colony Count   Final    95,000 COLONIES/ML Performed at [facility]    Culture   Final    Multiple bacterial morphotypes present, none predominant. Suggest appropriate recollection if clinically indicated. Performed at [facility]    Report Status 06/05/2014 FINAL  Final  MRSA PCR Screening     Status: None   Collection Time: 06/04/14  8:32 PM  Result Value Ref Range Status   MRSA by PCR NEGATIVE NEGATIVE Final    Comment:        The GeneXpert MRSA Assay (FDA approved for NASAL specimens only), is one component of a comprehensive MRSA colonization surveillance program. It is not intended to diagnose MRSA infection nor to guide or monitor treatment for MRSA infections.   Culture, blood (routine x 2)     Status: None (Preliminary result)   Collection Time: 06/07/14  1:31 AM  Result Value Ref Range Status   Specimen Description BLOOD RIGHT ARM  Final   Special Requests BOTTLES DRAWN AEROBIC AND ANAEROBIC 10CC  Final   Culture   Final           BLOOD CULTURE RECEIVED NO GROWTH TO DATE CULTURE WILL BE HELD FOR 5 DAYS BEFORE ISSUING A FINAL NEGATIVE REPORT Note: Culture results may be compromised due to an excessive volume of blood received in culture bottles. Performed at [facility]    Report Status PENDING  Incomplete  Culture, blood (routine x 2)     Status: None (Preliminary result)   Collection Time: 06/07/14  1:34 AM  Result Value Ref Range Status   Specimen Description BLOOD LEFT ARM  Final   Special Requests BOTTLES DRAWN AEROBIC AND ANAEROBIC 5CC  Final   Culture   Final  BLOOD CULTURE  RECEIVED NO GROWTH TO DATE CULTURE WILL BE HELD FOR 5 DAYS BEFORE ISSUING A FINAL NEGATIVE REPORT Performed at [facility]    Report Status PENDING  Incomplete   Studies/Results: No results found. Medications: I have reviewed the patient's current medications. Scheduled Meds: . calcium carbonate  1 tablet Oral BID  . clindamycin (CLEOCIN) IV  600 mg Intravenous 3 times per day  . docusate sodium  100 mg Oral Daily  . enoxaparin (LOVENOX) injection  50 mg Subcutaneous Q24H  . pantoprazole  40 mg Oral Daily  . polyethylene glycol  17 g Oral Daily  . sodium chloride  3 mL Intravenous Q12H   Continuous Infusions:   PRN Meds:.HYDROcodone-acetaminophen, ondansetron **OR** ondansetron (ZOFRAN) IV Assessment/Plan: Principal Problem:   Sepsis Active Problems:   Migraine   Cellulitis of leg without foot, right   Blood poisoning  Ms. Clancy Leiner is a 29 yo woman with hereditary lymphedema in her right lower leg who presented in sepsis with a cellulitic-appearing right lower leg; she has had two other similar presentations in the past. Her symptoms, negative blood cultures, negative urine cultures and normal chest xray point toward cellulitis as the cause of her sepsis. Doppler studies were performed to rule out right lower extremity DVT and were negative; an MRI of the right lower extremity was done and was negative for abscess. She continues to have febrile episodes (though these are generally down-trending), despite being on her fifth day of IV clindamycin. Patient is showing resolution clinically, but had a fever spike and slight increase in white count yesterday.  Improving Cellulitis of Right Leg: Clindamycin was chosen because she had AKI on vancomycin in the past and success with clindamycin on last hospitalization. Cellulitis appears to be nearly resolved. However, fever 101.6 last night and white count: 14.4-->10-->7.5-->11.2. Blood cultures negative. - Stopped IVF; patient  is eating well - Discontinued clindamycin IV (received 5 days); start linezolid per Dr. Hale Bogus recommendation 600 mg BID. He recommends several days; will reassess tomorrow - Tylenol PRN for fever  Chronic Hereditary Lymphedema Precox: Patient reports that her lymphedema started during puberty. Her father and sister have lymphedema in both legs and 3 of her sisters and paternal aunt have lymphedema in one leg. For all of the women, the lymphedema started during puberty. Lymphedema precox is the most common form of primary lymphedema and presents in the teenage years. It is hereditary, typically presents in one lower extremity and is more common in women. The pathogenesis is unknown, but estrogen is thought to play a role. Interestingly, familial lymphedema precox has been associated with distichiasis (a double row of eyelashes) and this subset of patients has lymphatic valve dysfunction (FOXC2 mutation). The patient has false eyelashes, but when asked, reports no eyelash abnormalities in the past. For her lymphedema, she uses compression stockings at home. Does not respect a low-salt diet. Reports recent increase in size of her leg. HIV nonreactive.  - Continue to elevate, use compression stockings once infection/pain under control; prophylactic penicillin has been shown effective in lowering recurrence, but effectiveness much less convincing in patients with BMI > 33 who have lymphedema or multiple cellulitis occurrences. Per Dr. Megan Salon, prophylactic PCN not recommended in this patient with only several occurrences of cellulitis - Infection treatment as above  Calf Pain: Patient is at risk for blood clot, so her daily lovenox [place] dose was increased to 60 mg. New calf pain today, no increased warmth or erythema. Had calf pain  on exam yesterday, better today. - Continue to monitor - Continue 60 mg lovenox [place] prophylaxis - Encourage out of bed  Resolved Migraine Headache: Patient had headache,  photophobia, nausea that are classic for her migraines; these symptoms are longer in duration than her typical and are subsiding. No photophobia or headache today. - Zofran (home medication) PRN - Norco for pain 1-2 tablets q6hr PRN  Anemia: Likely dilutional, last hemoglobin 9.3 (baseline ~12), stable.  - Continue to monitor  - CBC in am  Hypocalcemia: Baseline is ~9. Corrected calcium 7.6-->8.5. - Supplementing with calcium carbonate 1000 mg daily  Resolved Hypokalemia: 3.1-->3.5. - Supplemented with kdur 40 mg BID for 3 doses  GERD: On ondansetron at home  - Protonix 40 mg daily  Diet: regular diet  DVT Ppx: Lovenox 60 mg [place] (increased given risk factors and signs that are consistent with DVT, but likely attributable to her lymphedema/cellulitis)  Dispo: Disposition is deferred at this time, awaiting improvement of current medical problems.  Anticipated discharge in approximately 1-2 day(s). [facility].  The patient does not have a current PCP (No Pcp Per Patient) and does not know need an [facility] hospital follow-up appointment after discharge.  The patient does not have transportation limitations that hinder transportation to clinic appointments.  .Services Needed at time of discharge: Y = Yes, Blank = No PT:   OT:   RN:   Equipment:   Other:     LOS: 6 days    Karlene Einstein, MD 06/10/2014, 7:38 AM

## 2014-06-10 NOTE — Care Management Note (Signed)
  Page 1 of 1   06/10/2014     10:39:57 AM CARE MANAGEMENT NOTE 06/10/2014  Patient:  Jacqueline Woodard, Jacqueline Woodard   Account Number:  [id number]  Date Initiated:  06/08/2014  Documentation initiated by:  MAYO,HENRIETTA  Subjective/Objective Assessment:   dx sepsis ?2/2 cellulitis; lives alone     Action/Plan:   Anticipated DC Date:  06/11/2014   Anticipated DC Plan:  [place]  CM consult  [facility]      Choice offered to / List presented to:             Status of service:  Completed, signed off Medicare Important Message given?  NO (If response is "NO", the following Medicare IM given date fields will be blank) Date Medicare IM given:   Medicare IM given by:   Date Additional Medicare IM given:   Additional Medicare IM given by:    Discharge Disposition:  HOME/SELF CARE  Per UR Regulation:  Reviewed for med. necessity/level of care/duration of stay  If discussed at [place] of Stay Meetings, dates discussed:    Comments:   06-10-14 Medication assistance for Zyvox 4 tabs . Patient entered in [place] with Laverta Baltimore in pharmacy , Cost per tab of Zyvox is over $200 , Laverta Baltimore will enter an over ride so MATCH will cover 4 tabs . [facility] [address] , [facility] in [facility]. Patient aware and [facility] [facility] [address] [facility]  Evart information given to patient .  Magdalen Spatz RN BSN

## 2014-06-11 DIAGNOSIS — R7309 Other abnormal glucose: Secondary | ICD-10-CM

## 2014-06-11 LAB — CBC
HCT: 30.2 % — ABNORMAL LOW (ref 36.0–46.0)
HEMOGLOBIN: 10.1 g/dL — AB (ref 12.0–15.0)
MCH: 25.8 pg — AB (ref 26.0–34.0)
MCHC: 33.4 g/dL (ref 30.0–36.0)
MCV: 77 fL — ABNORMAL LOW (ref 78.0–100.0)
PLATELETS: 373 10*3/uL (ref 150–400)
RBC: 3.92 MIL/uL (ref 3.87–5.11)
RDW: 14.2 % (ref 11.5–15.5)
WBC: 12.6 10*3/uL — ABNORMAL HIGH (ref 4.0–10.5)

## 2014-06-11 LAB — GLUCOSE, CAPILLARY: Glucose-Capillary: 89 mg/dL (ref 70–99)

## 2014-06-11 MED ORDER — LINEZOLID 600 MG PO TABS: 600.0000 mg | ORAL_TABLET | Freq: Two times a day (BID) | ORAL | Status: DC

## 2014-06-11 MED ORDER — NYSTATIN 100000 UNIT/ML MT SUSP
5.0000 mL | Freq: Four times a day (QID) | OROMUCOSAL | Status: DC
  Administered 2014-06-11: 500000 [IU] via ORAL
  Filled 2014-06-11: qty 5

## 2014-06-11 MED ORDER — NYSTATIN 100000 UNIT/ML MT SUSP: 5.0000 mL | Freq: Four times a day (QID) | OROMUCOSAL | Status: DC

## 2014-06-11 NOTE — Progress Notes (Signed)
Subjective: Patient feels much better today. Swelling on her leg has markedly reduced, no tenderness. She had a temperature of 101 yesterday, but otherwise she feels well. She is now able to bear weight on her leg and move about her room. Her appetite has significantly improved. She will like to go home today. She feels she is almost back to baseline, and will like to follow up with [place] in clinic this week. She is also is requesting for a work note, as she stands at work all day- She works at [employer]. She is also compalining of some whitish coating on her tongue, had to scrap off.  Objective: Vital signs in last 24 hours: Filed Vitals:   06/10/14 1743 06/10/14 1851 06/10/14 2139 06/11/14 0552  BP:   118/80 110/61  Pulse:   90 80  Temp: 101 F (38.3 C) 98.7 F (37.1 C) 98.3 F (36.8 C) 98.8 F (37.1 C)  TempSrc: Oral Oral Oral Oral  Resp:   16 16  Height:      Weight:      SpO2:   98% 98%   Weight change:   Intake/Output Summary (Last 24 hours) at 06/11/14 1103 Last data filed at 06/11/14 0607  Gross per 24 hour  Intake    580 ml  Output      0 ml  Net    580 ml   Physical Exam: Appearance: Young lady, in NAD, lying in bed resting, lights on, no family at bedside. HEENT: AT/[place], PERRL, false eyelashes slightly detached OU, moist mucus membranes, slight whitis coating. Heart: RRR, normal S1S2 Lungs: CTAB, no wheezes Abdomen: BS+, soft, nontender today  Musculoskeletal: normal range of motion Extremities: persistent right lower extremity lymphedema, almost at baseline for patient, extending from knee to foot with no warmth or erythema, nontender, nonfluctuant, no calf tenderness, with skin wrinkling. Neurologic: A&Ox3, grossly intact  Lab Results: Basic Metabolic Panel:  Recent Labs Lab 06/08/14 0430 06/10/14 0407  NA 135 134*  K 3.1* 3.5  CL 105 105  CO2 25 25  GLUCOSE 113* 86  BUN <5* 6  CREATININE 0.84 0.80  CALCIUM 8.0* 8.3*   Liver Function Tests:  Recent  Labs Lab 06/04/14 1510  AST 28  ALT 8  ALKPHOS 118*  BILITOT 0.8  PROT 7.5  ALBUMIN 3.7   CBC:  Recent Labs Lab 06/04/14 1400  06/07/14 0750 06/08/14 0430 06/10/14 0407  WBC 14.1*  < > 10.0 7.5 11.2*  NEUTROABS 12.9*  --  7.2  --   --   HGB 12.7  < > 9.3* 9.4* 9.3*  HCT 41.0  < > 29.8* 30.0* 29.4*  MCV 80.6  < > 79.0 78.3 77.4*  PLT 197  < > 207 228 338  < > = values in this interval not displayed. CBG:  Recent Labs Lab 06/09/14 2149 06/10/14 0718 06/10/14 1133 06/10/14 1732 06/10/14 2132 06/11/14 0749  GLUCAP 119* 91 100* 82 126* 89   Urine Drug Screen: Drugs of Abuse     Component Value Date/Time   LABOPIA NONE DETECTED 10/30/2008 0210   COCAINSCRNUR NONE DETECTED 10/30/2008 0210   LABBENZ NONE DETECTED 10/30/2008 0210   AMPHETMU NONE DETECTED 10/30/2008 0210   THCU NONE DETECTED 10/30/2008 0210   LABBARB  10/30/2008 0210    NONE DETECTED        DRUG SCREEN FOR MEDICAL PURPOSES ONLY.  IF CONFIRMATION IS NEEDED FOR ANY PURPOSE, NOTIFY LAB WITHIN 5 DAYS.  LOWEST DETECTABLE LIMITS FOR URINE DRUG SCREEN Drug Class       Cutoff (ng/mL) Amphetamine      1000 Barbiturate      200 Benzodiazepine   725 Tricyclics       366 Opiates          300 Cocaine          300 THC              50    Urinalysis:  Recent Labs Lab 06/04/14 1407  COLORURINE YELLOW  LABSPEC 1.017  PHURINE 7.0  GLUCOSEU NEGATIVE  HGBUR NEGATIVE  BILIRUBINUR NEGATIVE  KETONESUR NEGATIVE  PROTEINUR NEGATIVE  UROBILINOGEN 0.2  NITRITE NEGATIVE  LEUKOCYTESUR NEGATIVE    Micro Results: Recent Results (from the past 240 hour(s))  Culture, blood (routine x 2)     Status: None   Collection Time: 06/04/14 12:56 PM  Result Value Ref Range Status   Specimen Description BLOOD RIGHT ARM  Final   Special Requests BOTTLES DRAWN AEROBIC ONLY 3 CC  Final   Culture   Final    NO GROWTH 5 DAYS Note: Culture results may be compromised due to an inadequate volume of blood received in  culture bottles. Performed at [facility]    Report Status 06/10/2014 FINAL  Final  Culture, blood (routine x 2)     Status: None   Collection Time: 06/04/14  2:00 PM  Result Value Ref Range Status   Specimen Description BLOOD RIGHT HAND  Final   Special Requests BOTTLES DRAWN AEROBIC ONLY 4 CC  Final   Culture   Final    NO GROWTH 5 DAYS Performed at [facility]    Report Status 06/10/2014 FINAL  Final  Urine culture     Status: None   Collection Time: 06/04/14  2:07 PM  Result Value Ref Range Status   Specimen Description URINE, RANDOM  Final   Special Requests NONE  Final   Colony Count   Final    95,000 COLONIES/ML Performed at [facility]    Culture   Final    Multiple bacterial morphotypes present, none predominant. Suggest appropriate recollection if clinically indicated. Performed at [facility]    Report Status 06/05/2014 FINAL  Final  MRSA PCR Screening     Status: None   Collection Time: 06/04/14  8:32 PM  Result Value Ref Range Status   MRSA by PCR NEGATIVE NEGATIVE Final    Comment:        The GeneXpert MRSA Assay (FDA approved for NASAL specimens only), is one component of a comprehensive MRSA colonization surveillance program. It is not intended to diagnose MRSA infection nor to guide or monitor treatment for MRSA infections.   Culture, blood (routine x 2)     Status: None (Preliminary result)   Collection Time: 06/07/14  1:31 AM  Result Value Ref Range Status   Specimen Description BLOOD RIGHT ARM  Final   Special Requests BOTTLES DRAWN AEROBIC AND ANAEROBIC 10CC  Final   Culture   Final           BLOOD CULTURE RECEIVED NO GROWTH TO DATE CULTURE WILL BE HELD FOR 5 DAYS BEFORE ISSUING A FINAL NEGATIVE REPORT Note: Culture results may be compromised due to an excessive volume of blood received in culture bottles. Performed at [facility]    Report Status PENDING  Incomplete  Culture, blood (routine x 2)      Status: None (  Preliminary result)   Collection Time: 06/07/14  1:34 AM  Result Value Ref Range Status   Specimen Description BLOOD LEFT ARM  Final   Special Requests BOTTLES DRAWN AEROBIC AND ANAEROBIC 5CC  Final   Culture   Final           BLOOD CULTURE RECEIVED NO GROWTH TO DATE CULTURE WILL BE HELD FOR 5 DAYS BEFORE ISSUING A FINAL NEGATIVE REPORT Performed at [facility]    Report Status PENDING  Incomplete   Medications: I have reviewed the patient's current medications. Scheduled Meds: . calcium carbonate  1 tablet Oral BID  . docusate sodium  100 mg Oral Daily  . enoxaparin (LOVENOX) injection  50 mg Subcutaneous Q24H  . linezolid  600 mg Oral Q12H  . pantoprazole  40 mg Oral Daily  . polyethylene glycol  17 g Oral Daily  . sodium chloride  3 mL Intravenous Q12H   Continuous Infusions:   PRN Meds:.acetaminophen, HYDROcodone-acetaminophen, ondansetron **OR** ondansetron (ZOFRAN) IV Assessment/Plan: Principal Problem:   Sepsis Active Problems:   Migraine   Cellulitis of leg without foot, right   Blood poisoning  Jacqueline Woodard is a 29 yo woman with hereditary lymphedema in her right lower leg, being managed for cellulitis with sepsis; has significantly improved since admission, blood cultures negative. Dopplers negative for DV, MRI of the right lower extremity negative for abscess. Fever curve trended downward but with intermittent spikes.   Improving Cellulitis of Right Leg: 101 yesterday afternoon. Blood cultures negative. Drug reaction cannot be ruled out as a cause of her fever. - Started Linezolid yesterday, talked to ID- Dr Megan Salon, recommends 2 more days of antibiotics starting tomorrow. - Discontinued clindamycin IV (received 5 days); start linezolid per Dr. Hale Bogus recommendation 600 mg BID. - Tylenol PRN for fever - Discharge today, follow up in our clinic- Wednesday or Thursday. Sent message to front desk. - Work note to start work on  Thursday. - MAP letter arranged for patient yesterday- 06/10/2014 to get Linezolid today.  Chronic Hereditary Lymphedema Precox: Significant family hx of same - Possible refferal to lymphedema clinic.  - Continue to elevate, use compression stockings once infection/pain under control; prophylactic penicillin has been shown effective in lowering recurrence, but effectiveness much less convincing in patients with BMI > 33 who have lymphedema or multiple cellulitis occurrences. Per Dr. Megan Salon, prophylactic PCN not recommended in this patient with only several occurrences of cellulitis - Infection treatment as above  Whitish coat on Tongue- Had pressed to say this is thrush. HIV- negative, HgBA1c- 6.1.  - Nystatin swish and swallow  Calf Pain: Patient is at risk for blood clot, so her daily lovenox [place] dose was increased to 60 mg. Dopplers neg for clot. - Continue to monitor - Continue 60 mg lovenox [place] prophylaxis - Encourage out of bed  Resolved Migraine Headache:  - Zofran (home medication) PRN - Norco for pain 1-2 tablets q6hr PRN  PreDM- Follo wup as outpt. - HGBa1c- 6.1. - Encourage weight loss and exercise.  Anemia: Likely related to sepsis and dilutional, last hemoglobin 9.3 (baseline ~12), stable.  - Continue to monitor  - CBC pending  Hypocalcemia: Baseline is ~9. Corrected calcium 7.6-->8.5. - Supplementing with calcium carbonate 1000 mg daily - Work up as an ou pt, might need Vit D.  Resolved Hypokalemia: 3.5 today - Supplemented with kdur 40 mg BID for 3 doses  GERD: On ondansetron at home  - Protonix 40 mg daily  Diet: regular  diet  DVT Ppx: Lovenox 60 mg [place], increased dose due to lymphedema, obesity.  Dispo: Discharge today..  Will be able to pick up linezolid PO at [facility] on [address].  The patient does not have a current PCP (No Pcp Per Patient) and does not know need an [facility] hospital follow-up appointment after discharge.  The patient does not have  transportation limitations that hinder transportation to clinic appointments.  .Services Needed at time of discharge: Y = Yes, Blank = No PT:   OT:   RN:   Equipment:   Other:     LOS: 7 days    Bethena Roys, MD 06/11/2014, 11:03 AM

## 2014-06-11 NOTE — Progress Notes (Signed)
Discussed discharge summary with patient. Reviewed all medications with patient. Addressed all of patients questions regarding where to pick up medications. Patient states that she will pick up prescription and take next dose as scheduled tonight at 2200. Patient ready for discharge.

## 2014-06-11 NOTE — Progress Notes (Signed)
Physical Therapy Evaluation Patient Details Name: Jacqueline Woodard MRN: [id number] DOB: 07/23/1985 Today's Date: 06/11/2014   History of Present Illness  29 yo female with congenital R LE lymphadema, admitted for septicemia and migrane resulting from R LE cellulitis.  Clinical Impression  Patient reports no pain at rest, but bad when puts weight on it.  Patient education regarding circulation and cardiovascular exercise principles and ideas per patient request to 'stay out of the hospital'.  Bed mobility with Independence, transfer and gait with RW and Min Guard due to pain.  Instructed patient to use arms to support body and give leg support as needed for pain.  Patient presents with decreased activity tolerance at this time due to residual pain in R LE from infection.  Patient will benefit from continued PT intervention for strengthening and exercise principles while in hospital, but is appropriate to return home when medically stable.     Follow Up Recommendations Home health PT (if desired)    Equipment Recommendations  Rolling walker with 5" wheels (Patient is 5'1".)    Recommendations for Other Services       Precautions / Restrictions Precautions Precautions: Fall Restrictions Weight Bearing Restrictions: No      Mobility  Bed Mobility Overal bed mobility: Independent                Transfers Overall transfer level: Needs assistance Equipment used: Rolling walker (2 wheeled) Transfers: Sit to/from Stand Sit to Stand: Supervision;Min guard         General transfer comment: Painful with weight bearing R LE, walks on toes.  Ambulation/Gait Ambulation/Gait assistance: Supervision;Min guard Ambulation Distance (Feet): 30 Feet Assistive device: Rolling walker (2 wheeled) Gait Pattern/deviations: Step-to pattern;Antalgic   Gait velocity interpretation: Below normal speed for age/gender General Gait Details: Instruction for use of UE's to decrease pain in leg,  WBAT  Stairs            Wheelchair Mobility    Modified Rankin (Stroke Patients Only)       Balance Overall balance assessment: Needs assistance   Sitting balance-Leahy Scale: Good       Standing balance-Leahy Scale: Poor                               Pertinent Vitals/Pain Pain Assessment: 0-10 Pain Score: 2  (at rest, increases with putting weight on leg.) Pain Location: R Leg Pain Descriptors / Indicators: Burning Pain Intervention(s): Limited activity within patient's tolerance;Monitored during session    Home Living Family/patient expects to be discharged to:: Private residence Living Arrangements: Alone Available Help at Discharge: Family;Friend(s);Available PRN/intermittently Type of Home: House Home Access: Stairs to enter   Entrance Stairs-Number of Steps: 2 Home Layout: One level Home Equipment: None Additional Comments: wears compression garments on R LE at home.    Prior Function Level of Independence: Independent         Comments: works at [employer], on [employer] all day.     Hand Dominance        Extremity/Trunk Assessment   Upper Extremity Assessment: Overall WFL for tasks assessed           Lower Extremity Assessment: Overall WFL for tasks assessed;RLE deficits/detail RLE Deficits / Details: Chronic congenital lymphedema       Communication   Communication: No difficulties  Cognition Arousal/Alertness: Awake/alert Behavior During Therapy: WFL for tasks assessed/performed Overall Cognitive Status: Within Functional Limits for tasks assessed  Physical Therapy Evaluation Patient Details Name: Jacqueline Woodard MRN: [id number] DOB: 07/23/1985 Today's Date: 06/11/2014   History of Present Illness  29 yo female with congenital R LE lymphadema, admitted for septicemia and migrane resulting from R LE cellulitis.  Clinical Impression  Patient reports no pain at rest, but bad when puts weight on it.  Patient education regarding circulation and cardiovascular exercise principles and ideas per patient request to 'stay out of the hospital'.  Bed mobility with Independence, transfer and gait with RW and Min Guard due to pain.  Instructed patient to use arms to support body and give leg support as needed for pain.  Patient presents with decreased activity tolerance at this time due to residual pain in R LE from infection.  Patient will benefit from continued PT intervention for strengthening and exercise principles while in hospital, but is appropriate to return home when medically stable.     Follow Up Recommendations Home health PT (if desired)    Equipment Recommendations  Rolling walker with 5" wheels (Patient is 5'1".)    Recommendations for Other Services       Precautions / Restrictions Precautions Precautions: Fall Restrictions Weight Bearing Restrictions: No      Mobility  Bed Mobility Overal bed mobility: Independent                Transfers Overall transfer level: Needs assistance Equipment used: Rolling walker (2 wheeled) Transfers: Sit to/from Stand Sit to Stand: Supervision;Min guard         General transfer comment: Painful with weight bearing R LE, walks on toes.  Ambulation/Gait Ambulation/Gait assistance: Supervision;Min guard Ambulation Distance (Feet): 30 Feet Assistive device: Rolling walker (2 wheeled) Gait Pattern/deviations: Step-to pattern;Antalgic   Gait velocity interpretation: Below normal speed for age/gender General Gait Details: Instruction for use of UE's to decrease pain in leg,  WBAT  Stairs            Wheelchair Mobility    Modified Rankin (Stroke Patients Only)       Balance Overall balance assessment: Needs assistance   Sitting balance-Leahy Scale: Good       Standing balance-Leahy Scale: Poor                               Pertinent Vitals/Pain Pain Assessment: 0-10 Pain Score: 2  (at rest, increases with putting weight on leg.) Pain Location: R Leg Pain Descriptors / Indicators: Burning Pain Intervention(s): Limited activity within patient's tolerance;Monitored during session    Home Living Family/patient expects to be discharged to:: Private residence Living Arrangements: Alone Available Help at Discharge: Family;Friend(s);Available PRN/intermittently Type of Home: House Home Access: Stairs to enter   Entrance Stairs-Number of Steps: 2 Home Layout: One level Home Equipment: None Additional Comments: wears compression garments on R LE at home.    Prior Function Level of Independence: Independent         Comments: works at [employer], on [employer] all day.     Hand Dominance        Extremity/Trunk Assessment   Upper Extremity Assessment: Overall WFL for tasks assessed           Lower Extremity Assessment: Overall WFL for tasks assessed;RLE deficits/detail RLE Deficits / Details: Chronic congenital lymphedema       Communication   Communication: No difficulties  Cognition Arousal/Alertness: Awake/alert Behavior During Therapy: WFL for tasks assessed/performed Overall Cognitive Status: Within Functional Limits for tasks assessed

## 2014-06-12 NOTE — Discharge Summary (Signed)
Name: Jacqueline Woodard MRN: [id number] DOB: 01-May-1985 29 y.o. PCP: No Pcp Per Patient  Date of Admission: 06/04/2014 12:04 PM Date of Discharge: 06/11/2014 Attending Physician: Dr. Murriel Hopper  Discharge Diagnosis: Principal Problem:   Sepsis Active Problems:   Migraine   Cellulitis of leg without foot, right  Discharge Medications:   Medication List    STOP taking these medications        HYDROcodone-acetaminophen 5-325 MG per tablet  Commonly known as:  NORCO/VICODIN     ondansetron 4 MG disintegrating tablet  Commonly known as:  ZOFRAN ODT      TAKE these medications        acetaminophen 500 MG tablet  Commonly known as:  TYLENOL  Take 1,000 mg by mouth every 6 (six) hours as needed for fever.     esomeprazole 40 MG capsule  Commonly known as:  NEXIUM  Take 40 mg by mouth daily at 12 noon.     ferrous sulfate 325 (65 FE) MG tablet  Take 1 tablet (325 mg total) by mouth daily with breakfast.     linezolid 600 MG tablet  Commonly known as:  ZYVOX  Take 1 tablet (600 mg total) by mouth every 12 (twelve) hours.     Medical Compression Stockings Misc  Apply to Right leg     nystatin 100000 UNIT/ML suspension  Commonly known as:  MYCOSTATIN  Take 5 mLs (500,000 Units total) by mouth 4 (four) times daily.        Disposition and follow-up:   JacquelineGerrianne A Woodard was discharged from [facility] in Stable condition.  At the hospital follow up visit please address:  1.  Cellulitis: Please check for continued resolution of symptoms and fevers.  Hereditary Lymphedema: Patient should follow with a lymphedema clinic; per ID, no prophylactic antibiotics necessary at this time, but she could likely attain better control of her symptoms with better use of compression stockings, better care of her extremities, lower salt diet etc. Given compression stockings prescription.  Pre-Diabetes: HgbA1c 6.1%.  Anemia: Stable in the hospital at 9.3  (baseline ~12).   Hypocalcemia: Baseline is ~9. Corrected calcium 7.6-->8.5. Supplemented with calcium carbonate 1000 mg daily in the hospital, may need Vit D.  White Coat on Tongue: Patient complained of white scrapable coating on day of discharge and was treated and sent home on nystatin swish and swallow. HIV negative, HgbA1c 6.1%. Not likely to be thrush. Patient has a tongue ring. Please evaluate for resolution.  2.  Labs / imaging needed at time of follow-up: BMET, CBC  3.  Pending labs/ test needing follow-up: none  Follow-up Appointments: [facility] clinic to call to schedule an appointment  Discharge Instructions:     Discharge Instructions    Diet - low sodium heart healthy    Complete by:  As directed      Discharge instructions    Complete by:  As directed   We will call you with an appointment with [place] in out clinic here in the hospital. Please check you temperature and keep a diary also note if you are having your periods.  Complete 2 more days of Linezolid starting tomorrow.  Also when you follow up with your doctor enquire about lymphedema clinics. There are some here in [place], Also with [facility], also at [place]. There are several addresses, so just Google Lymphedema clinics in [facility], and you can call make an appointment with the one nearest to you.  Please keep your legs elevated, avoid injury or trauma to your legs. Please wear your compression stockings all the time.  We also started you on Nystatin swish and swallow for your mouth.  For you legs you can take Tylenol, and occasional Advil, alieve or ibuprofen, over the counter.     Increase activity slowly    Complete by:  As directed            Consultations:    Procedures Performed:  Dg Chest 2 View  06/04/2014   CLINICAL DATA:  Patient states she has been having pain on the right side of the body along with nausea and vomiting X 1 day. Patient denies any cough or SOB. Patient has a  hx of asthma. Patient has had no surgeries on the heart or lungs. Patient is a nonsmoker. Patient was shielded for the exam.  EXAM: CHEST  2 VIEW  COMPARISON:  04/27/2009  FINDINGS: Normal heart, mediastinum and hila. Clear lungs. No pleural effusion or pneumothorax. Bony thorax is unremarkable.  IMPRESSION: Normal chest radiographs.   Electronically Signed   By: Lajean Manes M.D.   On: 06/04/2014 13:28   Mr Tibia Fibula Right W Wo Contrast  06/07/2014   CLINICAL DATA:  Right leg pain, malaise, fever. Right leg cellulitis.  EXAM: MRI OF LOWER RIGHT EXTREMITY WITHOUT AND WITH CONTRAST  TECHNIQUE: Multiplanar, multisequence MR imaging of the right lower leg was performed both before and after administration of intravenous contrast.  CONTRAST:  81mL MULTIHANCE GADOBENATE DIMEGLUMINE 529 MG/ML IV SOLN  COMPARISON:  None.  FINDINGS: No marrow signal abnormality. No fracture or dislocation. No periostitis or bone destruction.  There is subcutaneous edema throughout the right lower leg most severe along the anterior aspect with enhancement most consistent with cellulitis. There is no focal fluid collection to suggest an abscess.  The muscles are normal in signal. The muscles enhance normally and homogeneously.  IMPRESSION: 1. Findings most consistent with right lower leg cellulitis without a drainable fluid collection to suggest an abscess.   Electronically Signed   By: Kathreen Devoid   On: 06/07/2014 08:07   US Abdomen Limited Ruq  06/05/2014   CLINICAL DATA:  Right upper quadrant abdominal pain for 1 day. Past medical history of a gastric ulcer.  EXAM: US ABDOMEN LIMITED - RIGHT UPPER QUADRANT  COMPARISON:  None.  FINDINGS: Gallbladder:  No gallstones or wall thickening visualized. No sonographic Murphy sign noted.  Common bile duct:  Diameter: 3.5 mm  Liver:  No focal lesion identified. Within normal limits in parenchymal echogenicity.  IMPRESSION: Normal right upper quadrant ultrasound.   Electronically Signed    By: Lajean Manes M.D.   On: 06/05/2014 09:07    Admission HPI: Jacqueline Woodard is a 29 yo woman with a history of migraine headaches, GERD and right leg lymphedema who presented to the ED because of her headache. The episode started last night with right greater than left diffuse pain. She felt like hammers were hitting the inside of her head. She noticed photophobia and phonophobia and later, the onset of nausea. She had no changes in vision. In many ways, this headache reminded her of her typical migraines, which occur about once every other month. She typically gets relief from Excedrin, but this headache persisted. Her pain and nausea have decreased in the hospital, and she would like to eat.  However, while in the ED, she was noted to have a leukocytosis to 14.1, tachycardia to 134, her  temperature as high as 103.2 and her lactic acid was 4.30. Her right lower extremity lymphedema was noted to be slightly warm and erythematous. On an admission in 11/2013, she presented in sepsis due to her right lower extremity cellulitis. At that time, she developed AKI on vancomycin but her infection resolved on clindamycin. She does admit to sick contacts at work who had "flu-like symptoms".  Hospital Course by problem list: Principal Problem:   Sepsis Active Problems:   Migraine   Cellulitis of leg without foot, right   Ms. Sapphira Harjo is a 29 yo woman with hereditary lymphedema in her right lower leg who presented to [facility] in sepsis and found to have cellulitis of the same leg. Her cellulitis significantly improved since admission, blood cultures remained negative. Dopplers negative for DV, MRI of the right lower extremity negative for abscess. Fever curve trended downward but had intermittent spikes.   Improved Cellulitis of Right Leg: 101 day prior to discharge, otherwise afebrile. Blood cultures negative. Drug reaction cannot be ruled out as a cause of her fever. Covered with clindamycin  (IV) for 5 days, then started liezolid per Dr. Hale Bogus recommendation at 600 mg BID (will receive 2 days of this as outpatient). Will be called for follow up appointment in [facility] Wednesday or Thursday. MAP letter was arranged for patient in order for her to pick up liezolid.  Chronic Hereditary Lymphedema Precox: Significant family hx of same. May benefit from lymphedema clinic, and advised to continue to elevate, use compression stockings once infection/pain under control and avoid high-salt diet. Prophylactic penicillin has been shown effective in lowering recurrence, but effectiveness much less convincing in patients with BMI > 33 who have lymphedema or multiple cellulitis occurrences. Per Dr. Megan Salon, prophylactic penicillin not recommended in this patient with only several occurrences of cellulitis  Calf Pain: Patient is at risk for blood clot, so her daily lovenox Bartlett dose was increased to 60 mg. She had negative dopplers for clot and ambulation was encouraged.  Resolved Migraine Headache: Patient has a history of migraine headaches and had a significant headache with photophobia the first several days of hospitalization.  Pre-Diabetes: Follow up as outpatient. Diet and exercise were encouraged. HgbA1c 6.1%.  Anemia: Likely related to sepsis and dilutional. Her last hemoglobin 9.3 (baseline ~12), stable.   Hypocalcemia: Baseline is ~9. Corrected calcium 7.6-->8.5. Received calcium carbonate 1000 mg daily in the hospital. May need workup as an outpatient, might need Vit D.  Discharge Vitals:   BP 121/79 mmHg  Pulse 89  Temp(Src) 99.6 F (37.6 C) (Oral)  Resp 16  Ht 5\' 1"  (1.549 m)  Wt 210 lb 8.6 oz (95.5 kg)  BMI 39.80 kg/m2  SpO2 100%  LMP 04/19/2014  Discharge Labs:  Results for orders placed or performed during the hospital encounter of 06/04/14 (from the past 24 hour(s))  CBC     Status: Abnormal   Collection Time: 06/11/14 12:00 PM  Result Value Ref Range   WBC 12.6 (H) 4.0  - 10.5 K/uL   RBC 3.92 3.87 - 5.11 MIL/uL   Hemoglobin 10.1 (L) 12.0 - 15.0 g/dL   HCT 30.2 (L) 36.0 - 46.0 %   MCV 77.0 (L) 78.0 - 100.0 fL   MCH 25.8 (L) 26.0 - 34.0 pg   MCHC 33.4 30.0 - 36.0 g/dL   RDW 14.2 11.5 - 15.5 %   Platelets 373 150 - 400 K/uL    Signed: Karlene Einstein, MD 06/12/2014, 11:27 AM  Services Ordered on Discharge: none Equipment Ordered on Discharge: none

## 2014-06-13 LAB — CULTURE, BLOOD (ROUTINE X 2)
CULTURE: NO GROWTH
Culture: NO GROWTH

## 2014-06-24 ENCOUNTER — Ambulatory Visit: Payer: Self-pay | Attending: Internal Medicine | Admitting: Internal Medicine

## 2014-06-24 ENCOUNTER — Telehealth: Payer: Self-pay | Admitting: Internal Medicine

## 2014-06-24 ENCOUNTER — Encounter: Payer: Self-pay | Admitting: Internal Medicine

## 2014-06-24 VITALS — BP 114/78 | HR 90 | Temp 98.3°F | Resp 16 | Ht 61.0 in | Wt 199.0 lb

## 2014-06-24 DIAGNOSIS — L03115 Cellulitis of right lower limb: Secondary | ICD-10-CM | POA: Insufficient documentation

## 2014-06-24 DIAGNOSIS — Z79899 Other long term (current) drug therapy: Secondary | ICD-10-CM | POA: Insufficient documentation

## 2014-06-24 DIAGNOSIS — K219 Gastro-esophageal reflux disease without esophagitis: Secondary | ICD-10-CM | POA: Insufficient documentation

## 2014-06-24 DIAGNOSIS — I890 Lymphedema, not elsewhere classified: Secondary | ICD-10-CM | POA: Insufficient documentation

## 2014-06-24 NOTE — Progress Notes (Signed)
Pt is here following up on her GERD, migraines and asthma. HFU Pt was diagnosed with cellulitis on her right leg.

## 2014-06-24 NOTE — Telephone Encounter (Signed)
Pt went to [employer] [employer] on [address] to purchase compression stalkings but was informed by sales rep that she would need a prescription for the recommended compression level. Please send script to store by fax: [phone].

## 2014-06-24 NOTE — Patient Instructions (Signed)
Lymphedema Lymphedema is a swelling caused by the abnormal collection of lymph under the skin. The lymph is fluid from the tissues in your body that travels in the lymphatic system. This system is part of the immune system that includes lymph nodes and vessels. The lymph vessels collect and carry the excess fluid, fats, proteins, and wastes from the tissues of the body to the bloodstream. This system also works to clean and remove bacteria and waste products from the body.  Lymphedema occurs when the lymphatic system is blocked. When the lymph vessels or lymph nodes are blocked or damaged, lymph does not drain properly. This causes abnormal build up of lymph. This leads to swelling in the arms or legs. Lymphedema cannot be cured by medicines. But the swelling can be reduced by physical methods. CAUSES  There are two types of lymphedema. Primary lymphedema is caused by the absence or abnormality of the lymph vessel at birth. It is also known as inherited lymphedema, which occurs rarely. Secondary or acquired lymphedema occurs when the lymph vessel is damaged or blocked. The causes of lymph vessel blockage are:   Skin infection like cellulites.  Infection by parasites (filariasis).  Injury.  Cancer.  Radiation therapy.  Formation of scar tissue.  Surgery. SYMPTOMS  The symptoms of lymphedema are:  Abnormal swelling of the arm or leg.  Heavy or tight feeling in your arm or leg.  Tight-fitting shoes or rings.  Redness of skin over the affected area.  Limited movement of the affected limb.  Some patients complain about sensitivity to touch and discomfort in the limb(s) affected. You may not have these symptoms immediately following injury. They usually appear within a few days or even years after injury. Inform your caregiver, if you have any of these symptoms. Early treatment can avoid further problems.  DIAGNOSIS  First, your caregiver will inquire about any surgery you have had or  medicines you are taking. He will then examine you. Your caregiver may order special imaging tests, such as:  Lymphoscintigraphy (a test in which a low dose of radioactive substance is injected to trace the flow of lymph through the lymph vessels).  MRI (imaging tests using magnetic fields).  Computed tomography (test using special cross-sectional X-rays).  Duplex ultrasound (test using high-frequency sound waves to show the vessels and the blood flow on a screen).  Lymphangiography (special X-ray taken after injecting a contrast dye into the lymph vessel). It is now rarely done. TREATMENT  Lymphedema can be treated in different ways. Your caregiver will decide the type of treatment depending on the cause. Treatment may include:  Exercise: Special exercises will help fluid move out easily from the affected part. This should be done as per your caregiver's advice.  Manual lymph drainage: Gentle massage of the affected limb makes the fluid to move out more freely.  Compression: Compression stockings or external pump apply pressure over the affected limb. This helps the fluid to move out from the arm or leg. Bandaging can also help to move the fluid out from the affected part. Your caregiver will decide the method that suits you the best.  Medicines: Your caregiver may prescribe antibiotics, if you have infection.  Surgery: Your caregiver may advise surgery for severe lymphedema. It is reserved for special cases when the patient has difficulty moving. Your surgeon may remove excess tissue from the arm or leg. This will help to ease your movement. Physical therapy may have to be continued after surgery. HOME CARE INSTRUCTIONS    The area is very fragile and is predisposed to injury and infection.  Eat a healthy diet.  Exercise regularly as per advice.  Keep the affected area clean and dry.  Use gloves while cooking or gardening.  Protect your skin from cuts.  Use electric razor to  shave the affected area.  Keep affected limb elevated.  Do not wear tight clothes, shoes, or jewelry as it may cause the tissue to be strangled.  Do not use heat pads over the affected area.  Do not sit with cross legs.  Do not walk barefoot.  Do not carry weight on the affected arm.  Avoid having blood pressure checked on the affected limb. SEEK MEDICAL CARE IF:  You continue to have swelling in your limb. SEEK IMMEDIATE MEDICAL CARE IF:   You have high fever.  You have skin rash.  You have chills or sweats.  You have pain or redness.  You have a cut that does not heal. MAKE SURE YOU:   Understand these instructions.  Will watch your condition.  Will get help right away if you are not doing well or get worse. Document Released: 12/30/2006 Document Revised: 02/19/2012 Document Reviewed: 12/05/2008 ExitCare Patient Information 2015 [employer]. This information is not intended to replace advice given to you by your health care provider. Make sure you discuss any questions you have with your health care provider.  

## 2014-06-24 NOTE — Progress Notes (Signed)
Patient ID: Jacqueline Woodard, female   DOB: 10/14/1985, 29 y.o.   MRN: [id number]  CC: HFU, lymphedema   HPI: Jacqueline Woodard is a 29 y.o. female African-American woman with a history of chronic migraine headaches and chronic congenital lymphedema of her right lower extremity. This has led to problems with recurrent cellulitis requiring a previous hospital admission in September 2015 and March 2016.  Patients hospital course was accompanied by several temperature spikes, limited IV medications choices, and slow antibiotic response. She was discharged 7 days later on oral Linezolid BID.  She has completed therapy since then is now following up on cellulitis.  She states that since she completed the medication she has had peeling of the skin and hypopigmentation. She states that the swelling has improved but is slightly tender to touch RLE. Squatting or bending makes her leg feel tight. She denies fevers, chills, diarrhea, nausea, vomiting.   Patient has No headache, No chest pain, No abdominal pain - No Nausea, No new weakness tingling or numbness, No Cough - SOB.  Allergies  Allergen Reactions  . Shellfish Allergy Swelling    SWELLING OF FACE AND THROAT   Past Medical History  Diagnosis Date  . Migraine   . GERD (gastroesophageal reflux disease)   . History of gastric ulcer   . Lymphedema of leg     right - no current med.  . Macromastia 02/2013  . Asthma     no inhaler use in months   Current Outpatient Prescriptions on File Prior to Visit  Medication Sig Dispense Refill  . esomeprazole (NEXIUM) 40 MG capsule Take 40 mg by mouth daily at 12 noon.    Marland Kitchen acetaminophen (TYLENOL) 500 MG tablet Take 1,000 mg by mouth every 6 (six) hours as needed for fever.    Water engineer Bandages & Supports (MEDICAL COMPRESSION STOCKINGS) MISC Apply to Right leg (Patient not taking: Reported on 06/24/2014) 1 each 1  . ferrous sulfate 325 (65 FE) MG tablet Take 1 tablet (325 mg total) by mouth daily with  breakfast. (Patient not taking: Reported on 06/24/2014) 30 tablet 0  . linezolid (ZYVOX) 600 MG tablet Take 1 tablet (600 mg total) by mouth every 12 (twelve) hours. (Patient not taking: Reported on 06/24/2014) 4 tablet 0  . nystatin (MYCOSTATIN) 100000 UNIT/ML suspension Take 5 mLs (500,000 Units total) by mouth 4 (four) times daily. (Patient not taking: Reported on 06/24/2014) 60 mL 0   No current facility-administered medications on file prior to visit.   Family History  Problem Relation Age of Onset  . Thyroid disease Mother    History   Social History  . Marital Status: Single    Spouse Name: N/A  . Number of Children: N/A  . Years of Education: N/A   Occupational History  . Not on file.   Social History Main Topics  . Smoking status: Never Smoker   . Smokeless tobacco: Never Used  . Alcohol Use: No  . Drug Use: No  . Sexual Activity: Yes    Birth Control/ Protection: None   Other Topics Concern  . Not on file   Social History Narrative    Review of Systems: See HPI  Objective:   Filed Vitals:   06/24/14 1004  BP: 114/78  Pulse: 90  Temp: 98.3 F (36.8 C)  Resp: 16    Physical Exam  Constitutional: She is oriented to person, place, and time.  Cardiovascular: Normal rate, regular rhythm and normal heart sounds.  Pulmonary/Chest: Effort normal and breath sounds normal.  Musculoskeletal: Normal range of motion. She exhibits tenderness.  RLE congential lymphedema Hypopigmentation of certain areas and skin peeling present  Neurological: She is alert and oriented to person, place, and time.  Skin: Skin is warm and dry.  Psychiatric: She has a normal mood and affect.     Lab Results  Component Value Date   WBC 12.6* 06/11/2014   HGB 10.1* 06/11/2014   HCT 30.2* 06/11/2014   MCV 77.0* 06/11/2014   PLT 373 06/11/2014   Lab Results  Component Value Date   CREATININE 0.80 06/10/2014   BUN 6 06/10/2014   NA 134* 06/10/2014   K 3.5 06/10/2014   CL 105  06/10/2014   CO2 25 06/10/2014    Lab Results  Component Value Date   HGBA1C 6.1* 06/04/2014   Lipid Panel     Component Value Date/Time   CHOL 165 12/16/2013 1015   TRIG 171* 12/16/2013 1015   HDL 30* 12/16/2013 1015   CHOLHDL 5.5 12/16/2013 1015   VLDL 34 12/16/2013 1015   LDLCALC 101* 12/16/2013 1015       Assessment and plan:   [name] was seen today for follow-up.  Diagnoses and all orders for this visit:  Lymphedema of right lower extremity This is a congential problem, advised patient to wear compression socks or ted hoses to help with circulation  Cellulitis of leg without foot, right Healing. Improved swelling and pain  Explained signs and symptoms that should warrant immediate attention.  Patient verbalized understanding with teach back used.  Return if symptoms worsen or fail to improve.       Chari Manning, [facility] [phone] 06/24/2014, 10:19 AM

## 2014-06-27 ENCOUNTER — Other Ambulatory Visit: Payer: Self-pay | Admitting: Internal Medicine

## 2014-06-27 NOTE — Telephone Encounter (Signed)
Call patient and let her know that she may use regular TED hoses. It will be fine to use pressure gradient of 10-20 mmHg. A for due to higher pressure gradient she will require proper fitting and ankle brachial index study to make sure high levels are safe.

## 2016-07-09 ENCOUNTER — Ambulatory Visit (HOSPITAL_COMMUNITY)
Admission: EM | Admit: 2016-07-09 | Discharge: 2016-07-09 | Disposition: A | Discharge status: Home / Self Care | Payer: BLUE CROSS/BLUE SHIELD | Attending: Internal Medicine | Admitting: Internal Medicine

## 2016-07-09 ENCOUNTER — Encounter (HOSPITAL_COMMUNITY): Payer: Self-pay | Admitting: Emergency Medicine

## 2016-07-09 ENCOUNTER — Ambulatory Visit (INDEPENDENT_AMBULATORY_CARE_PROVIDER_SITE_OTHER): Payer: BLUE CROSS/BLUE SHIELD

## 2016-07-09 DIAGNOSIS — M7989 Other specified soft tissue disorders: Secondary | ICD-10-CM | POA: Diagnosis not present

## 2016-07-09 DIAGNOSIS — M79641 Pain in right hand: Secondary | ICD-10-CM

## 2016-07-09 MED ORDER — MELOXICAM 15 MG PO TABS: 15.0000 mg | ORAL_TABLET | Freq: Every day | ORAL | 2 refills | Status: DC

## 2016-07-09 NOTE — Discharge Instructions (Signed)
Your pain and swelling is most likely related to some type of overuse injury. I have prescribed a medicine called Meloxicam for pain. Take 1 tablet every day. You may take Tylenol every 4-6 hours for additional pain control, not to exceed 4,000 mg a day of this medicine. I recommend rest, ice, compression through the use of a wrist splint, and elevate your wrist as much as possible. Should your pain persist or fail to resolve, follow up with an orthopedist or your primary care provider.

## 2016-07-09 NOTE — ED Provider Notes (Signed)
CSN: [id number]     Arrival date & time 07/09/16  1302 History   First MD Initiated Contact with Patient 07/09/16 1400     Chief Complaint  Patient presents with  . Arm Pain    right   (Consider location/radiation/quality/duration/timing/severity/associated sxs/prior Treatment) 31 year old female presents to clinic for evaluation of right hand pain. Patient is right handed, she denies any traumatic injuries, no repetitive motions. She works as a Quarry manager, states her symptoms have worsened over the last 3 days, he has notable swelling to the dorsal side of her hand, and pain with movement, extension, or flexion of the fingers. She has not tried anything for her pain.   The history is provided by the patient.  Arm Pain  This is a new problem. The current episode started more than 2 days ago. The problem occurs constantly. The problem has been gradually worsening. Associated symptoms comments: Numbness, tingling, pain. Exacerbated by: movement. Nothing relieves the symptoms. She has tried nothing for the symptoms. The treatment provided no relief.    Past Medical History:  Diagnosis Date  . Asthma    no inhaler use in months  . GERD (gastroesophageal reflux disease)   . History of gastric ulcer   . Lymphedema of leg    right - no current med.  . Macromastia 02/2013  . Migraine    Past Surgical History:  Procedure Laterality Date  . BREAST REDUCTION SURGERY Bilateral 03/01/2013   Procedure: BILATERAL MAMMARY REDUCTION  (BREAST);  Surgeon: Cristine Polio, MD;  Location: [place];  Service: Plastics;  Laterality: Bilateral;  . WISDOM TOOTH EXTRACTION     Family History  Problem Relation Age of Onset  . Thyroid disease Mother    Social History  Substance Use Topics  . Smoking status: Never Smoker  . Smokeless tobacco: Never Used  . Alcohol use No   OB History    No data available     Review of Systems  Constitutional: Negative for chills and fever.  HENT:  Negative.   Respiratory: Negative.   Cardiovascular: Negative.   Gastrointestinal: Negative.   Musculoskeletal: Negative for arthralgias, myalgias, neck pain and neck stiffness.  Skin: Negative.   Neurological: Negative.     Allergies  Vancomycin and Shellfish allergy  Home Medications   Prior to Admission medications   Medication Sig Start Date End Date Taking? Authorizing Provider  acetaminophen (TYLENOL) 500 MG tablet Take 1,000 mg by mouth every 6 (six) hours as needed for fever.    Historical Provider, MD  Elastic Bandages & Supports (MEDICAL COMPRESSION STOCKINGS) MISC Apply to Right leg Patient not taking: Reported on 06/24/2014 12/16/13   Tresa Garter, MD  esomeprazole (NEXIUM) 40 MG capsule Take 40 mg by mouth daily at 12 noon.    Historical Provider, MD  ferrous sulfate 325 (65 FE) MG tablet Take 1 tablet (325 mg total) by mouth daily with breakfast. Patient not taking: Reported on 06/24/2014 12/10/13   Caren Griffins, MD  meloxicam (MOBIC) 15 MG tablet Take 1 tablet (15 mg total) by mouth daily. 07/09/16   Barnet Glasgow, NP  nystatin (MYCOSTATIN) 100000 UNIT/ML suspension Take 5 mLs (500,000 Units total) by mouth 4 (four) times daily. Patient not taking: Reported on 06/24/2014 06/11/14   Ejiroghene Arlyce Dice, MD   Meds Ordered and Administered this Visit  Medications - No data to display  BP 123/85 (BP Location: Left Arm)   Pulse 80   Temp 98.4 F (36.9 C) (Oral)  LMP 07/02/2016 (Exact Date)   SpO2 100%  No data found.   Physical Exam  Constitutional: She is oriented to person, place, and time. She appears well-developed and well-nourished. No distress.  HENT:  Head: Normocephalic and atraumatic.  Right Ear: External ear normal.  Left Ear: External ear normal.  Musculoskeletal:       Right wrist: She exhibits decreased range of motion, tenderness and swelling.  Pain with extension of the fingers, and also pain with percussion over the extensor tendons. No  erythema to the skin, there is swelling the dorsal aspect of the hand.  Neurological: She is alert and oriented to person, place, and time.  Skin: Skin is warm and dry. Capillary refill takes less than 2 seconds. No rash noted. She is not diaphoretic. No erythema.  Psychiatric: She has a normal mood and affect. Her behavior is normal.  Nursing note and vitals reviewed.   Urgent Care Course     Procedures (including critical care time)  Labs Review Labs Reviewed - No data to display  Imaging Review Dg Hand Complete Right  Result Date: 07/09/2016 CLINICAL DATA:  Swelling and pain for 4 days.  No trauma history. EXAM: RIGHT HAND - COMPLETE 3+ VIEW COMPARISON:  None FINDINGS: No acute fracture or dislocation. Probable soft tissue swelling about the carpal bones, most apparent on the lateral view. No focal osseous lesion. No soft tissue gas. IMPRESSION: No acute osseous abnormality. Electronically Signed   By: Abigail Miyamoto M.D.   On: 07/09/2016 14:36     MDM   1. Right hand pain     No abnormalities on x-ray, treating with movement, wrist placed in a wrist splint, recommended RICE, follow-up with orthopedics if symptoms persist.    Barnet Glasgow, NP 07/09/16 1521

## 2016-07-09 NOTE — ED Triage Notes (Signed)
Pt reports tingling in her right hand that has radiated up to her elbow along with new pain and swelling.

## 2016-07-24 ENCOUNTER — Emergency Department (HOSPITAL_COMMUNITY)
Admission: EM | Admit: 2016-07-24 | Discharge: 2016-07-24 | Disposition: A | Discharge status: Home / Self Care | Payer: BLUE CROSS/BLUE SHIELD | Attending: Emergency Medicine | Admitting: Emergency Medicine

## 2016-07-24 ENCOUNTER — Encounter (HOSPITAL_COMMUNITY): Payer: Self-pay | Admitting: Emergency Medicine

## 2016-07-24 ENCOUNTER — Emergency Department (HOSPITAL_COMMUNITY): Payer: BLUE CROSS/BLUE SHIELD

## 2016-07-24 DIAGNOSIS — J45909 Unspecified asthma, uncomplicated: Secondary | ICD-10-CM | POA: Diagnosis not present

## 2016-07-24 DIAGNOSIS — B349 Viral infection, unspecified: Secondary | ICD-10-CM | POA: Diagnosis not present

## 2016-07-24 DIAGNOSIS — M79641 Pain in right hand: Secondary | ICD-10-CM | POA: Diagnosis not present

## 2016-07-24 DIAGNOSIS — R509 Fever, unspecified: Secondary | ICD-10-CM | POA: Diagnosis present

## 2016-07-24 LAB — CBC WITH DIFFERENTIAL/PLATELET
BASOS ABS: 0 10*3/uL (ref 0.0–0.1)
Basophils Relative: 0 %
EOS PCT: 1 %
Eosinophils Absolute: 0.1 10*3/uL (ref 0.0–0.7)
HCT: 39.7 % (ref 36.0–46.0)
Hemoglobin: 12.3 g/dL (ref 12.0–15.0)
LYMPHS PCT: 22 %
Lymphs Abs: 1.6 10*3/uL (ref 0.7–4.0)
MCH: 25.4 pg — ABNORMAL LOW (ref 26.0–34.0)
MCHC: 31 g/dL (ref 30.0–36.0)
MCV: 81.9 fL (ref 78.0–100.0)
Monocytes Absolute: 0.5 10*3/uL (ref 0.1–1.0)
Monocytes Relative: 8 %
Neutro Abs: 4.8 10*3/uL (ref 1.7–7.7)
Neutrophils Relative %: 69 %
PLATELETS: 239 10*3/uL (ref 150–400)
RBC: 4.85 MIL/uL (ref 3.87–5.11)
RDW: 13.4 % (ref 11.5–15.5)
WBC: 7.1 10*3/uL (ref 4.0–10.5)

## 2016-07-24 LAB — BASIC METABOLIC PANEL
Anion gap: 7 (ref 5–15)
BUN: 13 mg/dL (ref 6–20)
CO2: 23 mmol/L (ref 22–32)
CREATININE: 0.99 mg/dL (ref 0.44–1.00)
Calcium: 8.3 mg/dL — ABNORMAL LOW (ref 8.9–10.3)
Chloride: 106 mmol/L (ref 101–111)
GFR calc Af Amer: >60 mL/min (ref >60)
Glucose, Bld: 76 mg/dL (ref 65–99)
POTASSIUM: 3.8 mmol/L (ref 3.5–5.1)
Sodium: 136 mmol/L (ref 135–145)

## 2016-07-24 LAB — PREGNANCY, URINE: PREG TEST UR: NEGATIVE

## 2016-07-24 MED ORDER — KETOROLAC TROMETHAMINE 30 MG/ML IJ SOLN
15.0000 mg | Freq: Once | INTRAMUSCULAR | Status: AC
  Administered 2016-07-24: 15 mg via INTRAMUSCULAR
  Filled 2016-07-24: qty 1

## 2016-07-24 MED ORDER — ONDANSETRON 8 MG PO TBDP
8.0000 mg | ORAL_TABLET | Freq: Once | ORAL | Status: AC
  Administered 2016-07-24: 8 mg via ORAL
  Filled 2016-07-24: qty 1

## 2016-07-24 NOTE — ED Notes (Signed)
Unable to collect blood work.

## 2016-07-24 NOTE — ED Triage Notes (Signed)
Pt from home with complaints of fever and generalized body aches that began today. Pt has not taken any medication for this and is currently not febrile at time of assessment. Pt has an oral temperature of 99.1. Pt also reports nausea. Pt states the pain spans the entirety of her body.   Pt also reports right hand swelling and "cold" feeling that has been ongoing since 4/10. Pt states she has been seen at the [facility] who told her she had tendonitis, but she states her symptoms have not improved.

## 2016-07-24 NOTE — Discharge Instructions (Signed)
Continue taking ibuprofen or Tylenol as needed. Wear wrist splint as previously directed. Follow-up with PCP for further evaluation. Return to ED for worsening pain, increased fevers, increased vomiting, vision changes, increased leg swelling or injury.

## 2016-07-24 NOTE — ED Provider Notes (Signed)
[facility] DEPT Provider Note   CSN: [id number] Arrival date & time: 07/24/16  1346     History   Chief Complaint Chief Complaint  Patient presents with  . Fever  . Generalized Body Aches  . Hand Injury    HPI Jacqueline Woodard is a 31 y.o. female.  Patient presents with onset of generalized bodyaches, fever that began after waking up today. Patient states she feels like she got "hit by car." She reports nausea, lower abdominal pain and lower back pain. Also reports some diarrhea yesterday. 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 She denies any coughing, sneezing, URI symptoms. No meds PTA. Patient has a history of right hand swelling which she says has decreased in the past few weeks after being seen at urgent care and prescribed Mobic and wrist splint. She states that the swelling and pain has returned. She was told originally that it could be tendinitis but was not referred to any specialist. She states that she is still taking the Mobic. Denies any injury or trauma to the hand although she is right-handed and reports working with her hand. Patient reports right leg swelling however she states that this is baseline for her due to her lymphedema. Denies chest pain, trouble breathing, vision changes.      Past Medical History:  Diagnosis Date  . Asthma    no inhaler use in months  . GERD (gastroesophageal reflux disease)   . History of gastric ulcer   . Lymphedema of leg    right - no current med.  . Macromastia 02/2013  . Migraine     Patient Active Problem List   Diagnosis Date Noted  . Blood poisoning   . Lymphedema of right lower extremity 12/16/2013  . Acute kidney injury ([place]) 12/16/2013  . Sepsis ([place]) 12/05/2013  . Recurrent vomiting 12/05/2013  . Microcytic anemia 12/05/2013  . Edema 02/10/2012  . Pain in limb 02/10/2012  . Leg pain 10/21/2011  . Migraine 07/26/2006  . CARPAL TUNNEL SYNDROME, BILATERAL 07/26/2006  . ASTHMA  07/26/2006  . HX, PERSONAL, EXPOSURE TO LEAD 07/26/2006  . Cellulitis of leg without foot, right 03/18/2006    Past Surgical History:  Procedure Laterality Date  . BREAST REDUCTION SURGERY Bilateral 03/01/2013   Procedure: BILATERAL MAMMARY REDUCTION  (BREAST);  Surgeon: Cristine Polio, MD;  Location: [facility];  Service: Plastics;  Laterality: Bilateral;  . WISDOM TOOTH EXTRACTION      OB History    No data available       Home Medications    Prior to Admission medications   Medication Sig Start Date End Date Taking? Authorizing Provider  acetaminophen (TYLENOL) 500 MG tablet Take 1,000 mg by mouth every 6 (six) hours as needed for fever.   Yes [provider]  meloxicam (MOBIC) 15 MG tablet Take 1 tablet (15 mg total) by mouth daily. 07/09/16  Yes Barnet Glasgow, NP  Elastic Bandages & Supports ([place]) [place] Apply to Right leg Patient not taking: Reported on 06/24/2014 12/16/13   Tresa Garter, MD  ferrous sulfate 325 (65 FE) MG tablet Take 1 tablet (325 mg total) by mouth daily with breakfast. Patient not taking: Reported on 06/24/2014 12/10/13   Caren Griffins, MD  nystatin (MYCOSTATIN) 100000 UNIT/ML suspension Take 5 mLs (500,000 Units total) by mouth 4 (four) times daily. Patient not taking: Reported on 06/24/2014 06/11/14   Bethena Roys, MD  Family History Family History  Problem Relation Age of Onset  . Thyroid disease Mother     Social History Social History  Substance Use Topics  . Smoking status: Never Smoker  . Smokeless tobacco: Never Used  . Alcohol use No     Allergies   Vancomycin and Shellfish allergy   Review of Systems Review of Systems  Constitutional: Positive for fever. Negative for appetite change and chills.  HENT: Negative for ear pain, rhinorrhea, sneezing and sore throat.   Eyes: Negative for photophobia and visual disturbance.  Respiratory: Negative for cough, chest  tightness, shortness of breath and wheezing.   Cardiovascular: Positive for leg swelling. Negative for chest pain and palpitations.  Gastrointestinal: Positive for abdominal pain, diarrhea and nausea. Negative for blood in stool, constipation and vomiting.  Genitourinary: Negative for dysuria, hematuria, urgency, vaginal bleeding, vaginal discharge and vaginal pain.  Musculoskeletal: Positive for back pain and myalgias.  Skin: Negative for rash.  Neurological: Positive for weakness and headaches. Negative for dizziness, syncope and light-headedness.     Physical Exam Updated Vital Signs BP 119/82 (BP Location: Left Arm)   Pulse 98   Temp 99.1 F (37.3 C)   Resp 16   LMP 07/02/2016 (Exact Date)   SpO2 100%   Physical Exam  Constitutional: She appears well-developed and well-nourished. No distress.  HENT:  Head: Normocephalic and atraumatic.  Nose: Nose normal.  Eyes: Conjunctivae and EOM are normal. Right eye exhibits no discharge. Left eye exhibits no discharge. No scleral icterus.  Neck: Normal range of motion. Neck supple.  Cardiovascular: Normal rate, regular rhythm, normal heart sounds and intact distal pulses.  Exam reveals no gallop and no friction rub.   No murmur heard. Pulmonary/Chest: Effort normal and breath sounds normal. No respiratory distress.  Abdominal: Soft. Bowel sounds are normal. She exhibits no distension. There is tenderness (lower abd/suprapubic). There is no guarding.  Musculoskeletal: Normal range of motion. She exhibits edema (RLE). She exhibits no tenderness.  There is pitting edema of the right lower extremity, however no signs of gross cellulitis including no temperature change, no redness, no change in appearance. Good pulses bilaterally.  Neurological: She is alert. No sensory deficit. She exhibits normal muscle tone. Coordination normal.  Skin: Skin is warm and dry. No rash noted. She is not diaphoretic.  Psychiatric: She has a normal mood and  affect.  Nursing note and vitals reviewed.    ED Treatments / Results  Labs (all labs ordered are listed, but only abnormal results are displayed) Labs Reviewed  BASIC METABOLIC PANEL - Abnormal; Notable for the following:       Result Value   Calcium 8.3 (*)    All other components within normal limits  CBC WITH DIFFERENTIAL/PLATELET - Abnormal; Notable for the following:    MCH 25.4 (*)    All other components within normal limits  PREGNANCY, URINE    EKG  EKG Interpretation None       Radiology Dg Hand Complete Right  Result Date: 07/24/2016 CLINICAL DATA:  Entire right hand pain for 3 weeks. No known injury, past or present. EXAM: RIGHT HAND - COMPLETE 3+ VIEW COMPARISON:  07/09/2016 and earlier FINDINGS: There is no evidence of fracture or dislocation. There is no evidence of arthropathy or other focal bone abnormality. Soft tissues are unremarkable. Bone mineral density is normal. No erosions or significant joint space narrowing. Intercarpal spaces appear normal. IMPRESSION: Negative. Electronically Signed   By: Nolon Nations M.D.  On: 07/24/2016 15:21    Procedures Procedures (including critical care time)  Medications Ordered in ED Medications  ondansetron (ZOFRAN-ODT) disintegrating tablet 8 mg (8 mg Oral Given 07/24/16 1618)  ketorolac (TORADOL) 30 MG/ML injection 15 mg (15 mg Intramuscular Given 07/24/16 1724)     Initial Impression / Assessment and Plan / ED Course  I have reviewed the triage vital signs and the nursing notes.  Pertinent labs & imaging results that were available during my care of the patient were reviewed by me and considered in my medical decision making (see chart for details).     Patient's history and symptoms concerning for influenza Versus other viral illness versus dehydration.  X-ray of the right hand was obtained to reevaluate compared to previous study done a few weeks ago. It was negative for any fracture dislocation and no  significant soft tissue swelling was noted. Upreg negative. CBC and BMP were unremarkable today. Symptoms likely due to viral illness. Patient does not exhibit any signs of serious illness at this time. No concern for any increased swelling of her leg compared to baseline or any concern for cellulitis at this time. Patient encouraged to continue anti-inflammatories and wear wrist splint as previously directed 5 provider. There are no changes in her x-ray and no concern for any other acute abnormalities at this time. Symptoms could be due to carpal tunnel considering the distribution of the pain and history of work with hands. It also could be due to tendinitis as previously described. Patient appears stable for discharge at this time with no concern for any abnormalities. Strict return precautions given.  Final Clinical Impressions(s) / ED Diagnoses   Final diagnoses:  Viral illness    New Prescriptions New Prescriptions   No medications on file     Delia Heady, PA-C 07/24/16 1805    Macarthur Critchley, MD 07/25/16 [id number]

## 2016-12-19 ENCOUNTER — Encounter (HOSPITAL_COMMUNITY): Payer: Self-pay | Admitting: Emergency Medicine

## 2016-12-19 ENCOUNTER — Ambulatory Visit (HOSPITAL_COMMUNITY)
Admission: EM | Admit: 2016-12-19 | Discharge: 2016-12-19 | Disposition: A | Discharge status: Home / Self Care | Payer: BLUE CROSS/BLUE SHIELD | Attending: Family Medicine | Admitting: Family Medicine

## 2016-12-19 DIAGNOSIS — L03011 Cellulitis of right finger: Secondary | ICD-10-CM | POA: Diagnosis not present

## 2016-12-19 MED ORDER — CEPHALEXIN 500 MG PO CAPS: 500.0000 mg | ORAL_CAPSULE | Freq: Three times a day (TID) | ORAL | 0 refills | Status: DC

## 2016-12-19 MED ORDER — CEPHALEXIN 500 MG PO CAPS: 500.0000 mg | ORAL_CAPSULE | Freq: Two times a day (BID) | ORAL | 0 refills | Status: DC

## 2016-12-19 MED ORDER — NAPROXEN 500 MG PO TABS: 500.0000 mg | ORAL_TABLET | Freq: Two times a day (BID) | ORAL | 0 refills | Status: DC

## 2016-12-19 NOTE — Discharge Instructions (Signed)
Start your antibiotics. Apply topical antibacterial ointment to area and warm water soaks with peroxide a few times daily. These should be 15-20 minutes at a time. If this is worsening after 24-48 hours then please f/u here for further evaluation.

## 2016-12-19 NOTE — ED Provider Notes (Signed)
[facility]    CSN: [id number] Arrival date & time: 12/19/16  [place]     History   Chief Complaint Chief Complaint  Patient presents with  . Hand Pain    HPI Jacqueline Woodard is a 31 y.o. female.   31 yo presents with right 3rd finger pain along the nail region. Onset of pain 3-4 days. No drainage is noted. Some swelling. "Very painful". She does have her nails done regularly, last was 2 weeks ago.       Past Medical History:  Diagnosis Date  . Asthma    no inhaler use in months  . GERD (gastroesophageal reflux disease)   . History of gastric ulcer   . Lymphedema of leg    right - no current med.  . Macromastia 02/2013  . Migraine     Patient Active Problem List   Diagnosis Date Noted  . Blood poisoning   . Lymphedema of right lower extremity 12/16/2013  . Acute kidney injury ([place]) 12/16/2013  . Sepsis ([place]) 12/05/2013  . Recurrent vomiting 12/05/2013  . Microcytic anemia 12/05/2013  . Edema 02/10/2012  . Pain in limb 02/10/2012  . Leg pain 10/21/2011  . Migraine 07/26/2006  . CARPAL TUNNEL SYNDROME, BILATERAL 07/26/2006  . ASTHMA 07/26/2006  . HX, PERSONAL, EXPOSURE TO LEAD 07/26/2006  . Cellulitis of leg without foot, right 03/18/2006    Past Surgical History:  Procedure Laterality Date  . BREAST REDUCTION SURGERY Bilateral 03/01/2013   Procedure: BILATERAL MAMMARY REDUCTION  (BREAST);  Surgeon: Cristine Polio, MD;  Location: [facility];  Service: Plastics;  Laterality: Bilateral;  . WISDOM TOOTH EXTRACTION      OB History    No data available       Home Medications    Prior to Admission medications   Medication Sig Start Date End Date Taking? Authorizing Provider  acetaminophen (TYLENOL) 500 MG tablet Take 1,000 mg by mouth every 6 (six) hours as needed for fever.    [provider]  cephALEXin (KEFLEX) 500 MG capsule Take 1 capsule (500 mg total) by mouth 3 (three) times daily. 12/19/16   Bjorn Pippin, PA-C  Elastic Bandages & Supports ([place]) [place] Apply to Right leg Patient not taking: Reported on 06/24/2014 12/16/13   Tresa Garter, MD  ferrous sulfate 325 (65 FE) MG tablet Take 1 tablet (325 mg total) by mouth daily with breakfast. Patient not taking: Reported on 06/24/2014 12/10/13   Caren Griffins, MD  meloxicam (MOBIC) 15 MG tablet Take 1 tablet (15 mg total) by mouth daily. 07/09/16   Barnet Glasgow, NP  naproxen (NAPROSYN) 500 MG tablet Take 1 tablet (500 mg total) by mouth 2 (two) times daily with a meal. 12/19/16   Nimra Puccinelli, Vanessa [place], PA-C  nystatin (MYCOSTATIN) 100000 UNIT/ML suspension Take 5 mLs (500,000 Units total) by mouth 4 (four) times daily. Patient not taking: Reported on 06/24/2014 06/11/14   Bethena Roys, MD    Family History Family History  Problem Relation Age of Onset  . Thyroid disease Mother     Social History Social History  Substance Use Topics  . Smoking status: Never Smoker  . Smokeless tobacco: Never Used  . Alcohol use No     Allergies   Vancomycin and Shellfish allergy   Review of Systems Review of Systems  Constitutional: Negative for chills and fever.     Physical Exam Triage Vital Signs ED Triage Vitals [12/19/16 1913]  Enc Vitals Group     BP 133/83     Pulse Rate 81     Resp 18     Temp 98.3 F (36.8 C)     Temp Source Oral     SpO2 100 %     Weight 207 lb (93.9 kg)     Height 5\' 1"  (1.549 m)     Head Circumference      Peak Flow      Pain Score 8     Pain Loc      Pain Edu?      Excl. in [place]?    No data found.   Updated Vital Signs BP 133/83 (BP Location: Right Arm)   Pulse 81   Temp 98.3 F (36.8 C) (Oral)   Resp 18   Ht 5\' 1"  (1.549 m)   Wt 207 lb (93.9 kg)   SpO2 100%   BMI 39.11 kg/m   Visual Acuity Right Eye Distance:   Left Eye Distance:   Bilateral Distance:    Right Eye Near:   Left Eye Near:    Bilateral Near:     Physical Exam  Constitutional: She  appears well-developed and well-nourished. No distress.  Neurological: She is alert.  Skin: Skin is warm and dry. She is not diaphoretic.  Right 3rd finger tip with mild erythema along the anterior lateral nail fold without abscess, pain to palpation. Finger pad without swelling, erythema or warmth, No pain into DIP  Psychiatric: Her behavior is normal.  Nursing note and vitals reviewed.    UC Treatments / Results  Labs (all labs ordered are listed, but only abnormal results are displayed) Labs Reviewed - No data to display  EKG  EKG Interpretation None       Radiology No results found.  Procedures Procedures (including critical care time)  Medications Ordered in UC Medications - No data to display   Initial Impression / Assessment and Plan / UC Course  I have reviewed the triage vital signs and the nursing notes.  Pertinent labs & imaging results that were available during my care of the patient were reviewed by me and considered in my medical decision making (see chart for details).   Mild paronychia without abscess. Treat with oral ABX given swelling present and local abx as well with soaking and supportive care. F/U if worsening or develops an abscess.     Final Clinical Impressions(s) / UC Diagnoses   Final diagnoses:  Paronychia of finger, right    New Prescriptions New Prescriptions   CEPHALEXIN (KEFLEX) 500 MG CAPSULE    Take 1 capsule (500 mg total) by mouth 3 (three) times daily.   NAPROXEN (NAPROSYN) 500 MG TABLET    Take 1 tablet (500 mg total) by mouth 2 (two) times daily with a meal.     Controlled Substance Prescriptions [place] Controlled Substance Registry consulted? Not Applicable   Prudencio Pair 12/19/16 1946

## 2016-12-19 NOTE — ED Triage Notes (Signed)
Pt sts right index finger pain; pt denies any injury

## 2016-12-23 ENCOUNTER — Encounter (HOSPITAL_BASED_OUTPATIENT_CLINIC_OR_DEPARTMENT_OTHER): Payer: Self-pay | Admitting: Emergency Medicine

## 2016-12-23 ENCOUNTER — Emergency Department (HOSPITAL_BASED_OUTPATIENT_CLINIC_OR_DEPARTMENT_OTHER)
Admission: EM | Admit: 2016-12-23 | Discharge: 2016-12-23 | Disposition: A | Discharge status: Home / Self Care | Payer: BLUE CROSS/BLUE SHIELD | Attending: Emergency Medicine | Admitting: Emergency Medicine

## 2016-12-23 DIAGNOSIS — J45909 Unspecified asthma, uncomplicated: Secondary | ICD-10-CM | POA: Diagnosis not present

## 2016-12-23 DIAGNOSIS — L03011 Cellulitis of right finger: Secondary | ICD-10-CM | POA: Insufficient documentation

## 2016-12-23 DIAGNOSIS — Z79899 Other long term (current) drug therapy: Secondary | ICD-10-CM | POA: Insufficient documentation

## 2016-12-23 DIAGNOSIS — L089 Local infection of the skin and subcutaneous tissue, unspecified: Secondary | ICD-10-CM | POA: Diagnosis not present

## 2016-12-23 DIAGNOSIS — R2231 Localized swelling, mass and lump, right upper limb: Secondary | ICD-10-CM | POA: Insufficient documentation

## 2016-12-23 MED ORDER — LIDOCAINE HCL (PF) 1 % IJ SOLN: 20.0000 mL | Freq: Once | INTRAMUSCULAR | Status: DC

## 2016-12-23 MED ORDER — LIDOCAINE HCL (PF) 2 % IJ SOLN
INTRAMUSCULAR | Status: AC
  Administered 2016-12-23: 5 mL
  Filled 2016-12-23: qty 2

## 2016-12-23 MED ORDER — DOXYCYCLINE HYCLATE 100 MG PO CAPS
100.0000 mg | ORAL_CAPSULE | Freq: Two times a day (BID) | ORAL | 0 refills | Status: DC
Start: 2016-12-23 — End: 2017-01-17

## 2016-12-23 NOTE — ED Notes (Signed)
Dressing applied to her finger after I&D

## 2016-12-23 NOTE — ED Provider Notes (Signed)
[facility] DEPT [facility] Provider Note   CSN: [id number] Arrival date & time: 12/23/16  1541     History   Chief Complaint Chief Complaint  Patient presents with  . hand infection    HPI Jacqueline Woodard is a 31 y.o. female.  31yo F w/ h/o asthma, GERD who p/w finger infection. 1 week ago she was seen for pain of her right 4th finger, diagnosed with infection and given 5d keflex. She has taken medication but has had progressively worsening swelling and pain. Pain involves entire fingertip and she's noticed the color under her nail has turned white. No fever, no numbness. No h/o diabetes. No trauma, cuticle trimming, or wounds.    The history is provided by the patient.    Past Medical History:  Diagnosis Date  . Asthma    no inhaler use in months  . GERD (gastroesophageal reflux disease)   . History of gastric ulcer   . Lymphedema of leg    right - no current med.  . Macromastia 02/2013  . Migraine     Patient Active Problem List   Diagnosis Date Noted  . Blood poisoning   . Lymphedema of right lower extremity 12/16/2013  . Acute kidney injury ([place]) 12/16/2013  . Sepsis ([place]) 12/05/2013  . Recurrent vomiting 12/05/2013  . Microcytic anemia 12/05/2013  . Edema 02/10/2012  . Pain in limb 02/10/2012  . Leg pain 10/21/2011  . Migraine 07/26/2006  . CARPAL TUNNEL SYNDROME, BILATERAL 07/26/2006  . ASTHMA 07/26/2006  . HX, PERSONAL, EXPOSURE TO LEAD 07/26/2006  . Cellulitis of leg without foot, right 03/18/2006    Past Surgical History:  Procedure Laterality Date  . BREAST REDUCTION SURGERY Bilateral 03/01/2013   Procedure: BILATERAL MAMMARY REDUCTION  (BREAST);  Surgeon: Cristine Polio, MD;  Location: [facility];  Service: Plastics;  Laterality: Bilateral;  . WISDOM TOOTH EXTRACTION      OB History    No data available       Home Medications    Prior to Admission medications   Medication Sig Start Date End Date Taking? Authorizing  Provider  acetaminophen (TYLENOL) 500 MG tablet Take 1,000 mg by mouth every 6 (six) hours as needed for fever.    [provider]  doxycycline (VIBRAMYCIN) 100 MG capsule Take 1 capsule (100 mg total) by mouth 2 (two) times daily. 12/23/16   Little, Wenda [place], MD  Elastic Bandages & Supports ([employer]OCKINGS) [place] Apply to Right leg Patient not taking: Reported on 06/24/2014 12/16/13   Tresa Garter, MD  ferrous sulfate 325 (65 FE) MG tablet Take 1 tablet (325 mg total) by mouth daily with breakfast. Patient not taking: Reported on 06/24/2014 12/10/13   Caren Griffins, MD  meloxicam (MOBIC) 15 MG tablet Take 1 tablet (15 mg total) by mouth daily. 07/09/16   Barnet Glasgow, NP  naproxen (NAPROSYN) 500 MG tablet Take 1 tablet (500 mg total) by mouth 2 (two) times daily with a meal. 12/19/16   Young, Vanessa [place], PA-C  nystatin (MYCOSTATIN) 100000 UNIT/ML suspension Take 5 mLs (500,000 Units total) by mouth 4 (four) times daily. Patient not taking: Reported on 06/24/2014 06/11/14   Bethena Roys, MD    Family History Family History  Problem Relation Age of Onset  . Thyroid disease Mother     Social History Social History  Substance Use Topics  . Smoking status: Never Smoker  . Smokeless tobacco: Never Used  . Alcohol use No  Allergies   Vancomycin and Shellfish allergy   Review of Systems Review of Systems  Constitutional: Negative for fever.  Musculoskeletal: Negative for joint swelling.  Skin: Positive for color change.  Neurological: Negative for numbness.     Physical Exam Updated Vital Signs BP 122/85   Pulse 78   Temp 98.6 F (37 C) (Oral)   Resp 16   LMP 11/30/2016   SpO2 100%   Physical Exam  Constitutional: She is oriented to person, place, and time. She appears well-developed and well-nourished. No distress.  HENT:  Head: Normocephalic and atraumatic.  Eyes: Conjunctivae are normal.  Neck: Neck supple.    Musculoskeletal:  Uniform swelling and tenderness of R 4th distal finger particularly distal to DIP joint; able to flex at PIP joint, no proximal finger tenderness; subungal pus  Neurological: She is alert and oriented to person, place, and time.  Skin: Skin is warm and dry.  Psychiatric: She has a normal mood and affect. Judgment normal.  Nursing note and vitals reviewed.    ED Treatments / Results  Labs (all labs ordered are listed, but only abnormal results are displayed) Labs Reviewed - No data to display  EKG  EKG Interpretation None       Radiology No results found.  Procedures .Marland KitchenIncision and Drainage Date/Time: 12/23/2016 6:30 PM Performed by: Sharlett Iles Authorized by: Sharlett Iles   Consent:    Consent obtained:  Verbal   Consent given by:  Patient   Risks discussed:  Bleeding and incomplete drainage   Alternatives discussed:  No treatment Location:    Type:  Abscess (paronychia)   Location:  Upper extremity   Upper extremity location:  Finger   Finger location:  R ring finger Pre-procedure details:    Skin preparation:  Betadine Anesthesia (see MAR for exact dosages):    Anesthesia method:  Local infiltration   Local anesthetic:  Lidocaine 2% w/o epi Procedure type:    Complexity:  Simple Procedure details:    Incision types:  Stab incision   Incision depth:  Dermal   Scalpel blade:  11   Wound management:  Probed and deloculated and irrigated with saline   Drainage:  Purulent   Drainage amount:  Moderate   Wound treatment:  Wound left open   Packing materials:  None Post-procedure details:    Patient tolerance of procedure:  Tolerated well, no immediate complications   (including critical care time)  Medications Ordered in ED Medications  lidocaine (XYLOCAINE) 2 % injection (5 mLs  Given 12/23/16 1818)  lidocaine (XYLOCAINE) 2 % injection (5 mLs  Given 12/23/16 1819)     Initial Impression / Assessment and Plan / ED  Course  I have reviewed the triage vital signs and the nursing notes.   PT w/ finger paronychia initially treated w/ antibiotics alone, now worsening.No evidence of flexor tenosynovitis.  I&D'd at bedside with moderate pus drained from under nail. Discussed supportive measures including warm soaks and wound care. D/c'd keflex and gave doxy given finger swelling. Extensively reviewed return precautions. Pt set up for re-check in family medicine clinic later this week; she needs PCP anyway. Pt voiced understanding of plan.  Final Clinical Impressions(s) / ED Diagnoses   Final diagnoses:  Paronychia, finger, right    New Prescriptions New Prescriptions   DOXYCYCLINE (VIBRAMYCIN) 100 MG CAPSULE    Take 1 capsule (100 mg total) by mouth 2 (two) times daily.     Little, Wenda [place], MD 12/24/16 [id number]

## 2016-12-23 NOTE — ED Triage Notes (Signed)
Pt reports hand infection a week ago, 5 days of  Antibiotic therapy. Reports hand still swollen and painful, no relief. Alert and oriented x 4.

## 2016-12-27 ENCOUNTER — Encounter: Payer: Self-pay | Admitting: Family Medicine

## 2016-12-27 ENCOUNTER — Ambulatory Visit: Payer: BLUE CROSS/BLUE SHIELD | Admitting: Internal Medicine

## 2016-12-27 ENCOUNTER — Ambulatory Visit (INDEPENDENT_AMBULATORY_CARE_PROVIDER_SITE_OTHER): Payer: BLUE CROSS/BLUE SHIELD | Admitting: Family Medicine

## 2016-12-27 DIAGNOSIS — L03011 Cellulitis of right finger: Secondary | ICD-10-CM | POA: Diagnosis not present

## 2016-12-27 MED ORDER — IBUPROFEN 600 MG PO TABS: 600.0000 mg | ORAL_TABLET | Freq: Three times a day (TID) | ORAL | 0 refills | Status: DC | PRN

## 2016-12-27 MED ORDER — FLUCONAZOLE 150 MG PO TABS: 150.0000 mg | ORAL_TABLET | Freq: Once | ORAL | 0 refills | Status: DC

## 2016-12-27 MED ORDER — FLUCONAZOLE 150 MG PO TABS: 150.0000 mg | ORAL_TABLET | Freq: Once | ORAL | 0 refills | Status: AC

## 2016-12-27 NOTE — Patient Instructions (Addendum)
Thank you for coming in today, it was so nice to see you! Today we talked about:    Finger pain: Your finger is healing nicely.   Continue to soak the finger in warm water for 20 minutes, 2-3 times a day for the next 2 days. Keep the area dry in between soakings.   Take Ibuprofen for pain. Do not take this medication with mobic or naprosyn.   Please follow up next Weds with  Dr. Juleen China.   If you have any questions or concerns, please do not hesitate to call the office at [phone]. You can also message me directly via MyChart.   Sincerely,  Smitty Cords, MD

## 2016-12-27 NOTE — Progress Notes (Signed)
   Subjective:    Patient ID: Jacqueline Woodard , female   DOB: [birth year] , 31 y.o..   MRN: [id number]  HPI  THOMASINA HOUSLEY is here for:   1. Follow up for paronychia: Patient was seen in the ED on 10/8 for paronychia of the right ring finger. She required I&D of the finger which she states improved her symptoms. She was started on Doxycycline as well. Patient notes there finger has been getting better but she still has some pain with movement. Has been using Tylenol without any relief. No fevers, chills, nausea, vomiting, other joint pains. Admits to some cheesy white vaginal discharge which is typical for her when she gest a yeast infection.   Review of Systems: Per HPI.   Past Medical History: Patient Active Problem List   Diagnosis Date Noted  . Paronychia of finger of right hand 12/29/2016  . Blood poisoning   . Lymphedema of right lower extremity 12/16/2013  . Acute kidney injury ([place]) 12/16/2013  . Sepsis ([place]) 12/05/2013  . Recurrent vomiting 12/05/2013  . Microcytic anemia 12/05/2013  . Edema 02/10/2012  . Pain in limb 02/10/2012  . Leg pain 10/21/2011  . Migraine 07/26/2006  . CARPAL TUNNEL SYNDROME, BILATERAL 07/26/2006  . ASTHMA 07/26/2006  . HX, PERSONAL, EXPOSURE TO LEAD 07/26/2006  . Cellulitis of leg without foot, right 03/18/2006    Medications: reviewed   Social Hx:  reports that she has never smoked. She has never used smokeless tobacco.   Objective:   BP 100/60 (BP Location: Right Arm, Patient Position: Sitting, Cuff Size: Large)   Pulse 84   Temp 98.1 F (36.7 C) (Oral)   Wt 212 lb 3.2 oz (96.3 kg)   LMP 11/30/2016   SpO2 100%   BMI 40.09 kg/m  Physical Exam  Gen: NAD, alert, cooperative with exam, well-appearing Right Hand exam: No swelling, erythema. False nails on all fingers except right 4th digit. No purulent discharge seen. Tenderness present on palpation of nail bed of right 4th digit. Ligaments intact, FROM all joints.Neurovascularly  intact  Assessment & Plan:  Paronychia of finger of right hand Improving. S/p I&D of right ring finger on 10/8. Day 4 of doxycycline. No signs of abscess or retained purulence. Good range of motion of the finger but some mild tenderness still present. Wondering if patient's fake nails/nail salon were possible cause of infection.  - Continue Doxycycline to complete course - Continue warm water soaks, instructions given on how to do this - She endorsed vaginal yeast infection after starting Doxy, Rx for diflucan sent to pharmacy - Rx for Ibuprofen 600 mg TID PRN for pain - Return precautions discussed - Patient needs a visit to establish care, appt scheduled for next week with Dr. Normajean Baxter, MD [place], PGY-3

## 2016-12-29 DIAGNOSIS — L03011 Cellulitis of right finger: Secondary | ICD-10-CM | POA: Insufficient documentation

## 2016-12-29 NOTE — Assessment & Plan Note (Addendum)
Improving. S/p I&D of right ring finger on 10/8. Day 4 of doxycycline. No signs of abscess or retained purulence. Good range of motion of the finger but some mild tenderness still present. Wondering if patient's fake nails/nail salon were possible cause of infection.  - Continue Doxycycline to complete course - Continue warm water soaks, instructions given on how to do this - She endorsed vaginal yeast infection after starting Doxy, Rx for diflucan sent to pharmacy - Rx for Ibuprofen 600 mg TID PRN for pain - Return precautions discussed - Patient needs a visit to establish care, appt scheduled for next week with Dr. Juleen China

## 2016-12-30 MED ORDER — FLUCONAZOLE 150 MG PO TABS: 150.0000 mg | ORAL_TABLET | Freq: Once | ORAL | 0 refills | Status: AC

## 2016-12-30 NOTE — Progress Notes (Signed)
Ibuprofen called into pharmacy. Ottis Stain, CMA

## 2016-12-30 NOTE — Addendum Note (Signed)
Addended by: Adin Hector on: 12/30/2016 01:22 PM   Modules accepted: Orders

## 2017-01-01 ENCOUNTER — Encounter: Payer: BLUE CROSS/BLUE SHIELD | Admitting: Internal Medicine

## 2017-01-10 ENCOUNTER — Encounter: Payer: Self-pay | Admitting: Family Medicine

## 2017-01-10 ENCOUNTER — Ambulatory Visit (INDEPENDENT_AMBULATORY_CARE_PROVIDER_SITE_OTHER): Payer: BLUE CROSS/BLUE SHIELD | Admitting: Family Medicine

## 2017-01-10 VITALS — BP 108/58 | HR 76 | Temp 98.0°F | Wt 214.6 lb

## 2017-01-10 DIAGNOSIS — S61309A Unspecified open wound of unspecified finger with damage to nail, initial encounter: Secondary | ICD-10-CM

## 2017-01-10 NOTE — Patient Instructions (Signed)
Jacqueline Woodard, you were seen today for a fingernail removal. I have provided you with some instructions on how to care for your nail.   If you develop worsening pain, fevers, chills or drainage from the nail area I want you to come to the office. Otherwise follow up as needed.  [name] L. Rosalyn Gess, [place] [facility] Resident PGY-2 01/10/2017 5:03 PM

## 2017-01-13 NOTE — Progress Notes (Signed)
    Subjective:  Jacqueline Woodard is a 31 y.o. female who presents to the [facility] today for fingernail removal.   HPI:  Fingernail avulsion: Recently seen for paronychia of the fourth digit of the right hand.  She had an I&D on 12/23/2016 and completed a course of doxycycline.  Her fingernail has since avulsed from the 12 to 6 o'clock position (radial side).  She denies significant pain, and just finds this to be irritating.  Denies any drainage, fevers, chills or trauma to the finger.  She is requesting the finger nail be removed today.    PMH: Recent paronychia of fourth digit of the right hand Tobacco use: Never used Medication: reviewed and updated ROS: see HPI   Objective:  Physical Exam: BP (!) 108/58   Pulse 76   Temp 98 F (36.7 C) (Oral)   Wt 214 lb 9.6 oz (97.3 kg)   LMP 11/30/2016 (Approximate)   SpO2 98%   BMI 40.55 kg/m   Gen: 31 year old female in NAD, resting comfortably MSK: No cyanosis clubbing or edema.  Finger nail of fourth digit on the right hand partially avulsed from the 12 to 6 o'clock position (radial side).  No tenderness to palpation, full active and passive range of motion.  Skin: warm, dry, no erythema or drainage Neuro: grossly normal, moves all extremities  No results found for this or any previous visit (from the past 72 hour(s)).  Partial avulsion: Fingernail removal Patient given informed consent, signed copy in the chart. Appropriate time out taken.   Digital block of the fourth digit on right hand done using 2% lidocaine without epinephrine, 4 cc total.  Area prepped and draped in usual sterile fashion.  Medial aspect of fingernail was secured with hemostat and easily removed without complication.  There was no bleeding.  sterile bandage applied and post procedure instructions given.  Patient tolerated procedure well without complications.   Assessment/Plan:  Partial avulsion of fingernail Digital block performed and fingernail easily removed  with hemostat as noted above and there was no bleeding or complications.  Aftercare instructions provided to patient and return precautions discussed.   Daniel L. Rosalyn Gess, [place] Resident PGY-2 01/13/2017 10:46 AM

## 2017-01-17 ENCOUNTER — Encounter: Payer: Self-pay | Admitting: Internal Medicine

## 2017-01-17 ENCOUNTER — Ambulatory Visit (INDEPENDENT_AMBULATORY_CARE_PROVIDER_SITE_OTHER): Payer: BLUE CROSS/BLUE SHIELD | Admitting: Internal Medicine

## 2017-01-17 ENCOUNTER — Other Ambulatory Visit (HOSPITAL_COMMUNITY)
Admission: RE | Admit: 2017-01-17 | Discharge: 2017-01-17 | Disposition: A | Discharge status: Home / Self Care | Payer: BLUE CROSS/BLUE SHIELD | Source: Ambulatory Visit | Attending: Family Medicine | Admitting: Family Medicine

## 2017-01-17 VITALS — BP 128/80 | HR 98 | Temp 97.7°F | Ht 61.0 in | Wt 213.8 lb

## 2017-01-17 DIAGNOSIS — Z113 Encounter for screening for infections with a predominantly sexual mode of transmission: Secondary | ICD-10-CM

## 2017-01-17 DIAGNOSIS — R102 Pelvic and perineal pain: Secondary | ICD-10-CM | POA: Diagnosis not present

## 2017-01-17 DIAGNOSIS — R7303 Prediabetes: Secondary | ICD-10-CM

## 2017-01-17 DIAGNOSIS — Z124 Encounter for screening for malignant neoplasm of cervix: Secondary | ICD-10-CM

## 2017-01-17 LAB — POCT GLYCOSYLATED HEMOGLOBIN (HGB A1C): Hemoglobin A1C: 5.5

## 2017-01-17 NOTE — Progress Notes (Signed)
   Subjective:    Jacqueline Woodard - 31 y.o. female MRN [id number]  Date of birth: 1986-02-20  HPI  Jacqueline Woodard is here for annual exam. Needs PAP and requests STD screening.   STD Screening: requests full screening. Denies symptoms of abnormal vaginal bleeding, vaginal discharge, dysuria, urinary frequency.   Pelvic Pain: Reports bilateral adnexal pain x3 months with menses. She has never had problems like this in the past. Periods have remained regular. She denies vaginal bleeding. Not using any hormonal contraception.   Social Hx:  No exercise.  Never smoker.  Drinks 2-4x/month. No binge drinking.  No drug use.  Sexually active with female partner. Feels safe in relationship. PHQ-2 score of 0.   ROS:  Patient reports no  vision/ hearing changes,anorexia, weight change, fever ,adenopathy, persistant / recurrent hoarseness, swallowing issues, chest pain, edema,persistant / recurrent cough, hemoptysis, dyspnea(rest, exertional, paroxysmal nocturnal), gastrointestinal  bleeding (melena, rectal bleeding), abdominal pain, excessive heart burn, GU symptoms(dysuria, hematuria, pyuria, voiding/incontinence  Issues) syncope, focal weakness, severe memory loss, concerning skin lesions, depression, anxiety, abnormal bruising/bleeding, major joint swelling, breast masses or abnormal vaginal bleeding.     Health Maintenance Due  Topic Date Due  . TETANUS/TDAP  01/26/2003  . PAP SMEAR  01/17/2008    -  reports that she has never smoked. She has never used smokeless tobacco. - Review of Systems: Per HPI. - Past Medical History: Patient Active Problem List   Diagnosis Date Noted  . Paronychia of finger of right hand 12/29/2016  . Migraine 07/26/2006  . ASTHMA 07/26/2006  . HX, PERSONAL, EXPOSURE TO LEAD 07/26/2006   - Medications: reviewed and updated   Objective:   Physical Exam BP 128/80   Pulse 98   Temp 97.7 F (36.5 C) (Oral)   Ht 5\' 1"  (1.549 m)   Wt 213 lb 12.8 oz (97  kg)   LMP 01/11/2017 (Exact Date)   SpO2 96%   BMI 40.40 kg/m  Gen: NAD, alert, cooperative with exam, well-appearing HEENT: NCAT, PERRL, clear conjunctiva, oropharynx clear, supple neck CV: RRR, good S1/S2, no murmur, no edema, capillary refill brisk  Resp: CTABL, no wheezes, non-labored Abd: SNTND, BS present, no guarding or organomegaly Skin: no rashes, normal turgor  Neuro: no gross deficits.  Psych: good insight, alert and oriented GU/GYN: Exam performed in the presence of a chaperone. External genitalia within normal limits.  Vaginal mucosa pink, moist, normal rugae.  Nonfriable cervix without lesions, no discharge or bleeding noted on speculum exam.  Bimanual exam revealed normal, nongravid uterus.  No cervical motion tenderness. No adnexal masses bilaterally. Pain minimally reproducible at adnexa bilaterally.      Assessment & Plan:   1. Screening for STD (sexually transmitted disease) - HIV antibody (with reflex) - RPR - Cytology - PAP [place]  2. Screening for malignant neoplasm of cervix Patient unaware of previous abnormal PAP. Last PAP on record 2006 was negative.  - Cytology - PAP [place]  3. Pelvic pain Given location of pain and reproducibility on exam, will obtain pelvic ultrasound to evaluate for ovarian cysts or adnexal masses. Would consider endometriosis given relationship to menses. If ultrasound negative, would discuss option of OCP to see if helps with symptoms.  - US PELVIC COMPLETE WITH TRANSVAGINAL; Future  4. Prediabetes Last A1c in pre-diabetic range with result of 6.1 (March 2016).  - HgB A1c  Phill Myron, D.O. 01/17/2017, 4:55 PM PGY-3, [place]

## 2017-01-17 NOTE — Patient Instructions (Addendum)
Thanks for coming in today! We will call you with your lab results from today next week.   I have ordered your pelvic ultrasound to evaluate your pelvic pain with periods. I will call you with the results.   Take Care,   Dr. Juleen China

## 2017-01-18 LAB — RPR: RPR Ser Ql: NONREACTIVE

## 2017-01-18 LAB — HIV ANTIBODY (ROUTINE TESTING W REFLEX): HIV Screen 4th Generation wRfx: NONREACTIVE

## 2017-01-21 LAB — CYTOLOGY - PAP
CHLAMYDIA, DNA PROBE: NEGATIVE
Diagnosis: NEGATIVE
HPV (WINDOPATH): NOT DETECTED
NEISSERIA GONORRHEA: NEGATIVE

## 2017-01-22 ENCOUNTER — Ambulatory Visit (HOSPITAL_BASED_OUTPATIENT_CLINIC_OR_DEPARTMENT_OTHER): Payer: BLUE CROSS/BLUE SHIELD

## 2017-01-22 ENCOUNTER — Telehealth: Payer: Self-pay | Admitting: *Deleted

## 2017-01-22 NOTE — Telephone Encounter (Signed)
-----   Message from Nicolette Bang, DO sent at 01/22/2017  9:45 AM EST ----- Please call patient to let her know Pap was normal. Her HIV, Syphilis, Gonorrhea, and Chlamydia were all negative.   Phill Myron, D.O. 01/22/2017, 9:45 AM PGY-3, [place]

## 2017-01-22 NOTE — Telephone Encounter (Signed)
Pt informed of below. Zimmerman Rumple, Lorenna Lurry D, CMA  

## 2017-02-05 ENCOUNTER — Ambulatory Visit (HOSPITAL_BASED_OUTPATIENT_CLINIC_OR_DEPARTMENT_OTHER)
Admission: RE | Admit: 2017-02-05 | Discharge: 2017-02-05 | Disposition: A | Discharge status: Home / Self Care | Payer: BLUE CROSS/BLUE SHIELD | Source: Ambulatory Visit | Attending: Family Medicine | Admitting: Family Medicine

## 2017-02-05 DIAGNOSIS — R102 Pelvic and perineal pain: Secondary | ICD-10-CM | POA: Insufficient documentation

## 2017-02-05 DIAGNOSIS — D259 Leiomyoma of uterus, unspecified: Secondary | ICD-10-CM | POA: Insufficient documentation

## 2017-02-05 DIAGNOSIS — R9389 Abnormal findings on diagnostic imaging of other specified body structures: Secondary | ICD-10-CM | POA: Diagnosis not present

## 2017-02-13 ENCOUNTER — Telehealth: Payer: Self-pay | Admitting: Internal Medicine

## 2017-02-13 NOTE — Telephone Encounter (Signed)
Called patient to discuss the results of her pelvic ultrasound. Left voicemail asking patient to call us back.  Hyman Bible, MD PGY-3

## 2017-02-14 NOTE — Telephone Encounter (Signed)
Pt returned call(lm on nurse line).  You may reach her @ [phone]. Fleeger, Salome Spotted, CMA

## 2017-02-17 ENCOUNTER — Ambulatory Visit: Payer: BLUE CROSS/BLUE SHIELD | Admitting: Student

## 2017-02-17 ENCOUNTER — Encounter: Payer: Self-pay | Admitting: Student

## 2017-02-17 ENCOUNTER — Other Ambulatory Visit: Payer: Self-pay

## 2017-02-17 ENCOUNTER — Telehealth: Payer: Self-pay | Admitting: Internal Medicine

## 2017-02-17 VITALS — BP 118/94 | HR 89 | Temp 98.3°F | Ht 61.0 in | Wt 212.2 lb

## 2017-02-17 DIAGNOSIS — M722 Plantar fascial fibromatosis: Secondary | ICD-10-CM

## 2017-02-17 DIAGNOSIS — R9389 Abnormal findings on diagnostic imaging of other specified body structures: Secondary | ICD-10-CM

## 2017-02-17 MED ORDER — NAPROXEN 500 MG PO TABS: 500.0000 mg | ORAL_TABLET | Freq: Two times a day (BID) | ORAL | 0 refills | Status: DC

## 2017-02-17 NOTE — Progress Notes (Signed)
Subjective:    Jacqueline Woodard is a 31 y.o. old female here right foot pain  HPI Right foot pain: this has been going on for one month. Spontaneous onset. No history of injury or trauma. She has swelling in right leg for years. She says she had a lot of work up for this. Never had DVT. Pain is from heel to the middle of her foot. Pain is worse with first step and eases of a little bit. 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 No fever or chills. Not walking bare foot or wears sandals at home. She tried Ibuprofen and aleve intermittently.   PMH/Problem List: has Migraine; ASTHMA; HX, PERSONAL, EXPOSURE TO LEAD; and Paronychia of finger of right hand on their problem list.   has a past medical history of Asthma, GERD (gastroesophageal reflux disease), History of gastric ulcer, Lymphedema of leg, Macromastia (02/2013), and Migraine.  FH:  Family History  Problem Relation Age of Onset  . Thyroid disease Mother     SH Social History   Tobacco Use  . Smoking status: Never Smoker  . Smokeless tobacco: Never Used  Substance Use Topics  . Alcohol use: No  . Drug use: No    Review of Systems Review of systems negative except for pertinent positives and negatives in history of present illness above.     Objective:     Vitals:   02/17/17 1337  BP: (!) 118/94  Pulse: 89  Temp: 98.3 F (36.8 C)  TempSrc: Oral  SpO2: 97%  Weight: 212 lb 3.2 oz (96.3 kg)  Height: 5\' 1"  (1.549 m)   Body mass index is 40.09 kg/m.  Physical Exam  GEN: appears well, no apparent distress. CVS: RRR, nl s1 & s2, marked edema in right leg and foot,  2+ DP & PT pulses bilaterally RESP: no IWOB, good air movement bilaterally, CTAB GI: BS present & normal, soft, NTND MSK: Ankle:  No visible erythema, some swelling in both feet but more in right  Tenderness to palpation over her left heel to mid foot. No tenderness over posterior aspects of lateral and medial malleolus  Range of motion is full in all  directions.  Strength is 5/5 in all directions (dorsal & plantar flexion, abduction & adduction, eversion and inversion). Light sensation intact in all dermatomes.  Stable lateral and medial ligaments; squeeze test and kleiger test unremarkable  No sign of peroneal tendon subluxations or tenderness to palpation  Able to walk 4 steps, no obvious pronation or supination.   SKIN: no apparent skin lesion NEURO: alert and oiented appropriately, no gross deficits  PSYCH: euthymic mood with congruent affect    Assessment and Plan:  1. Plantar fasciitis of right foot: with start with scheduled Naproxen for 5-7 days along with rolling her feet on bottle of cold water or ice up to 4 times a day. Discussed about the importance of proper shoe. If no improvement with this, will consider steroid injection or referral to sport medicine - naproxen (NAPROSYN) 500 MG tablet; Take 1 tablet (500 mg total) by mouth 2 (two) times daily with a meal.  Dispense: 30 tablet; Refill: 0  2. Endometrial thickening on ultrasound: ultrasound showed thickening of endometrial thickening which could be normal given her age. However, can not rule out malignancy. So will refer to Gyn for further evaluation and managment - Ambulatory referral to Gynecology   Return if symptoms worsen or fail to improve.  Mercy Riding, MD 02/19/17 Pager: [phone]

## 2017-02-17 NOTE — Telephone Encounter (Signed)
Called patient back to discuss ultrasound findings and likelihood of needed sonohysterogram. Patient reports she was just seen by another doctor this afternoon at [facility] for another reason and referral was placed with Ob-GYN. Checked EMR and saw referral for endometrial thickening on ultrasound placed by Dr. Cyndia Skeeters.   Phill Myron, D.O. 02/17/2017, 2:21 PM PGY-3, [place]

## 2017-02-17 NOTE — Telephone Encounter (Signed)
Cat- do you want to follow-up with this now that you're back in town?

## 2017-02-17 NOTE — Patient Instructions (Signed)
It was great seeing you today! We have addressed the following issues today  Right foot pain: This is likely due to plantar fasciitis.  Recommend taking the naproxen twice daily for the next 5-7 days.  I also recommend ruling your feet on bottle of cold or icy water 3-4 times a day.  If no improvement with these in the next 1 week, please come back and see [place].  For your ultrasound, we send a referral to gynecologist.  Someone will get in touch with you about this in the next couple of weeks.  If we did any lab work today, and the results require attention, either me or my nurse will get in touch with you. If everything is normal, you will get a letter in mail and a message via . If you don't hear from [place] in two weeks, please give [place] a call. Otherwise, we look forward to seeing you again at your next visit. If you have any questions or concerns before then, please call the clinic at [phone].  Please bring all your medications to every doctors visit  Sign up for My Chart to have easy access to your labs results, and communication with your Primary care physician.    Please check-out at the front desk before leaving the clinic.    Take Care,   Dr. Cyndia Skeeters   Plantar Fasciitis Plantar fasciitis is a painful foot condition that affects the heel. It occurs when the band of tissue that connects the toes to the heel bone (plantar fascia) becomes irritated. This can happen after exercising too much or doing other repetitive activities (overuse injury). The pain from plantar fasciitis can range from mild irritation to severe pain that makes it difficult for you to walk or move. The pain is usually worse in the morning or after you have been sitting or lying down for a while. What are the causes? This condition may be caused by:  Standing for long periods of time.  Wearing shoes that do not fit.  Doing high-impact activities, including running, aerobics, and ballet.  Being  overweight.  Having an abnormal way of walking (gait).  Having tight calf muscles.  Having high arches in your feet.  Starting a new athletic activity.  What are the signs or symptoms? The main symptom of this condition is heel pain. Other symptoms include:  Pain that gets worse after activity or exercise.  Pain that is worse in the morning or after resting.  Pain that goes away after you walk for a few minutes.  How is this diagnosed? This condition may be diagnosed based on your signs and symptoms. Your health care provider will also do a physical exam to check for:  A tender area on the bottom of your foot.  A high arch in your foot.  Pain when you move your foot.  Difficulty moving your foot.  You may also need to have imaging studies to confirm the diagnosis. These can include:  X-rays.  Ultrasound.  MRI.  How is this treated? Treatment for plantar fasciitis depends on the severity of the condition. Your treatment may include:  Rest, ice, and over-the-counter pain medicines to manage your pain.  Exercises to stretch your calves and your plantar fascia.  A splint that holds your foot in a stretched, upward position while you sleep (night splint).  Physical therapy to relieve symptoms and prevent problems in the future.  Cortisone injections to relieve severe pain.  Extracorporeal shock wave therapy (ESWT)  to stimulate damaged plantar fascia with electrical impulses. It is often used as a last resort before surgery.  Surgery, if other treatments have not worked after 12 months.  Follow these instructions at home:  Take medicines only as directed by your health care provider.  Avoid activities that cause pain.  Roll the bottom of your foot over a bag of ice or a bottle of cold water. Do this for 20 minutes, 3-4 times a day.  Perform simple stretches as directed by your health care provider.  Try wearing athletic shoes with air-sole or gel-sole  cushions or soft shoe inserts.  Wear a night splint while sleeping, if directed by your health care provider.  Keep all follow-up appointments with your health care provider. How is this prevented?  Do not perform exercises or activities that cause heel pain.  Consider finding low-impact activities if you continue to have problems.  Lose weight if you need to. The best way to prevent plantar fasciitis is to avoid the activities that aggravate your plantar fascia. Contact a health care provider if:  Your symptoms do not go away after treatment with home care measures.  Your pain gets worse.  Your pain affects your ability to move or do your daily activities. This information is not intended to replace advice given to you by your health care provider. Make sure you discuss any questions you have with your health care provider. Document Released: 11/27/2000 Document Revised: 08/07/2015 Document Reviewed: 01/12/2014 Elsevier Interactive Patient Education  [employer].

## 2017-02-19 ENCOUNTER — Encounter: Payer: Self-pay | Admitting: Student

## 2017-02-19 DIAGNOSIS — R9389 Abnormal findings on diagnostic imaging of other specified body structures: Secondary | ICD-10-CM | POA: Insufficient documentation

## 2017-02-19 DIAGNOSIS — M722 Plantar fascial fibromatosis: Secondary | ICD-10-CM | POA: Insufficient documentation

## 2017-03-21 ENCOUNTER — Ambulatory Visit (INDEPENDENT_AMBULATORY_CARE_PROVIDER_SITE_OTHER): Payer: BLUE CROSS/BLUE SHIELD | Admitting: Internal Medicine

## 2017-03-21 ENCOUNTER — Encounter: Payer: Self-pay | Admitting: Internal Medicine

## 2017-03-21 ENCOUNTER — Other Ambulatory Visit: Payer: Self-pay

## 2017-03-21 VITALS — BP 118/86 | HR 91 | Temp 98.5°F | Ht 61.0 in | Wt 212.8 lb

## 2017-03-21 DIAGNOSIS — M722 Plantar fascial fibromatosis: Secondary | ICD-10-CM

## 2017-03-21 NOTE — Patient Instructions (Signed)
I have placed a referral to sports medicine for your plantar fascitis. You should be receiving a phone call soon for an appointment.

## 2017-03-24 NOTE — Progress Notes (Signed)
   Subjective:    Jacqueline Woodard - 32 y.o. female MRN [id number]  Date of birth: Sep 17, 1985  HPI  Jacqueline Woodard is here for follow up of right foot pain. Was seen at [facility] on 12/3 by Dr. Cyndia Skeeters for foot pain x1 month. Thought to have plantar fascitis. She was given an Rx for scheduled Naproxen as well as instructions to roll feet on cold water bottle. Patient reports she took Naproxen without improvement in her pain. She attempted to use the cold water bottle but it was too painful. Pain is located over bottom of right heel to the midfoot. Pain is worst first thing in morning and after periods of prolonged standing. 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 At home, she wears bedroom slippers. Not currently participating in any type of physical activity regimen. She has chronic edema of her lower extremities bilaterally that is unchanged. Pain does not radiate up the leg. She denies numbness/tingling of toes.    -  reports that  has never smoked. she has never used smokeless tobacco. - Review of Systems: Per HPI. - Past Medical History: Patient Active Problem List   Diagnosis Date Noted  . Plantar fasciitis of right foot 02/19/2017  . Endometrial thickening on ultrasound 02/19/2017  . Paronychia of finger of right hand 12/29/2016  . Migraine 07/26/2006  . ASTHMA 07/26/2006  . HX, PERSONAL, EXPOSURE TO LEAD 07/26/2006   - Medications: reviewed and updated   Objective:   Physical Exam BP 118/86   Pulse 91   Temp 98.5 F (36.9 C) (Oral)   Ht 5\' 1"  (1.549 m)   Wt 212 lb 12.8 oz (96.5 kg)   LMP 03/01/2017 (Exact Date)   SpO2 100%   BMI 40.21 kg/m  Gen: NAD, alert, cooperative with exam, well-appearing Right LE: No visible erythema. Edema of lower extremities bilaterally right >left. 2+ DP and PT pulses. TTP over plantar aspect of right calcaneous and insertion of the fascia. ROM at ankles full. Appears to have  some collapse of longitudinal right arch. Strength 5/5 in right ankle. Negative Thompson test. Able to ambulate without difficulty.        Assessment & Plan:   1. Plantar fasciitis Exam and history consistent with plantar fasciitis. Patient has not responded to NSAIDs. Have recommended stretching exercises with towel and against wall. Handout from Sports medicine advisor provided. Instructed to wear supportive shoes while in the house. Ice and NSAIDs prn. Referral to [facility] for evaluation for orthotics. Patient may be candidate for steroid injection as well.   Phill Myron, D.O. 03/24/2017, 2:19 PM PGY-3, [place]

## 2017-04-03 ENCOUNTER — Encounter: Payer: Self-pay | Admitting: Gynecology

## 2017-04-03 ENCOUNTER — Ambulatory Visit: Payer: BLUE CROSS/BLUE SHIELD | Admitting: Gynecology

## 2017-04-03 VITALS — BP 124/84 | Ht 61.0 in | Wt 212.0 lb

## 2017-04-03 DIAGNOSIS — N921 Excessive and frequent menstruation with irregular cycle: Secondary | ICD-10-CM | POA: Diagnosis not present

## 2017-04-03 DIAGNOSIS — N840 Polyp of corpus uteri: Secondary | ICD-10-CM

## 2017-04-03 DIAGNOSIS — N946 Dysmenorrhea, unspecified: Secondary | ICD-10-CM | POA: Diagnosis not present

## 2017-04-03 NOTE — Progress Notes (Signed)
    Jacqueline Woodard 06-11-1985 [id number]        31 y.o.  G0P0000 new patient presents in referral with a 25-month history of worsening menorrhagia and dysmenorrhea.  She notes that her menses seem to be getting heavier and more painful over the last 6 months.  Has occasional intermittent discomfort with intercourse but not a consistent basis.  No significant pain in between her periods.  Also notes that her menses have been irregular with some variation in when they occur from every month to every other month.  Normally flows 5-7 days.  No significant weight changes hair or skin changes.  No hot flashes night sweats.  Ultrasound 01/2017 showed small 7 mm myoma.  Uterus normal size.  Right and left ovaries grossly normal.  Endometrial echo 11.8 mm heterogeneous with internal vascularity suggesting endometrial lesion.  Past medical history,surgical history, problem list, medications, allergies, family history and social history were all reviewed and documented in the EPIC chart.  Directed ROS with pertinent positives and negatives documented in the history of present illness/assessment and plan.  Exam: [name] assistant Vitals:   04/03/17 0945  BP: 124/84  Weight: 212 lb (96.2 kg)  Height: 5\' 1"  (1.549 m)   General appearance:  Normal Abdomen soft nontender without masses guarding rebound Pelvic external BUS vagina normal.  Cervix normal.  Uterus normal size midline mobile nontender.  Adnexa without masses or tenderness.  Assessment/Plan:  32 y.o. G0P0000 with:  1. Irregular menses.  Patient having menses every month to every other month.  No prolonged or atypical bleeding in between.  Does not go more than 2 months without menses.  We reviewed the differential diagnoses of irregular menses range from hormonal abnormality such as thyroid, prolactin or premature menopause as well as the more likely diagnosis of irregular ovulation.  PCOS also reviewed although she does not have issues as  far as acne or hair growth.  Management with irregular ovulation to include menstrual regulation with hormonal manipulation such as oral contraceptives or menstrual suppression with Mirena IUD discussed.  Ovulatory stimulation if pregnancy desired with medication also discussed.  Option for no treatment also discussed in that she does withdraw every 2 months.  The issues of endometrial hyperplasia if less frequent shedding of the endometrium discussed.  Will check FSH TSH prolactin and then further discuss with these results. 2. Increasing menorrhagia and dysmenorrhea.  Does have a small myoma.  Reviewed with her that I doubt this small myoma is the source of her symptoms.  We discussed it myomas in general and the natural history of them.  She has a thickened endometrium with suggestion of a endometrial polyp with vascularity on Doppler flow.  Recommended sonohysterogram to better define the cavity and allow for endometrial sampling.  Possible/probable need to proceed with hysteroscopic resection discussed.  Options for proceeding directly to hysteroscopic resection avoiding sonohysterogram also reviewed although risk of finding no pathology at the time of surgery also discussed.  Patient is more comfortable with sonohysterogram first to better define the pathology/anatomy.  She will schedule and follow-up for this.  Greater than 50% of my time was spent in direct face to face counseling and coordination of care with the patient.     Anastasio Auerbach MD, 10:12 AM 04/03/2017

## 2017-04-03 NOTE — Patient Instructions (Signed)
Follow-up for the ultrasound as scheduled. 

## 2017-04-04 LAB — FOLLICLE STIMULATING HORMONE: FSH: 7 m[IU]/mL

## 2017-04-04 LAB — TSH: TSH: 2.2 mIU/L

## 2017-04-04 LAB — PROLACTIN: Prolactin: 11.9 ng/mL

## 2017-04-08 ENCOUNTER — Encounter: Payer: Self-pay | Admitting: Sports Medicine

## 2017-04-08 ENCOUNTER — Ambulatory Visit: Payer: BLUE CROSS/BLUE SHIELD | Admitting: Sports Medicine

## 2017-04-08 DIAGNOSIS — M722 Plantar fascial fibromatosis: Secondary | ICD-10-CM

## 2017-04-09 NOTE — Progress Notes (Signed)
   Subjective:    Patient ID: Jacqueline Woodard, female    DOB: 22-Aug-1985, 32 y.o.   MRN: [id number]  HPI chief complaint: Right foot pain  32 year old female comes in today complaining of 2 months of right foot pain that she localizes to the right heel. She denies any trauma. She does describe a sudden onset of pain that she first noticed when awakening one morning. Her pain is worse with first step as well as with prolonged standing. She was seen by her primary care physician and diagnosed with plantar fasciitis. She's been doing some home stretches and was placed on naproxen sodium but this has not been helpful. She's also been massaging her heel with a frozen water bottle. She denies similar problems in the past. She denies numbness or tingling. She does endorse swelling but this is chronic from  lymphedema. No pain at rest. She works as a [employer].  Past medical history reviewed Medications reviewed Allergies reviewed    Review of Systems    as above Objective:   Physical Exam  Well-developed, well-nourished. No acute distress  Right foot: There is diffuse swelling of the right ankle, foot, and lower leg secondary to chronic lymphedema. Patient is tender to palpation over the calcaneal origin of the plantar fascia. Negative calcaneal squeeze. No other tenderness to palpation. Pes planus with standing. Good pulses. Walking with a slight limp.  Bedside MSK ultrasound was performed. Right plantar fascia well-visualized and measures 0.46 cm which is consistent with plantar fasciitis.      Assessment & Plan:   Right foot pain secondary to plantar fasciitis  Had a long talk with the patient regarding treatment options. Her pes planus is likely contributed to her symptoms. We are going to start with a green sports insoles with scaphoid pads to help provide arch support. I educated her about proper plantar fascial stretching which she will need to do daily. I would also like for her to do  daily ice baths submerging just her heel for 10-15 minutes. I gave her information on the [facility] which she can purchase on her own if she would like. Follow-up with me in 4 weeks. If symptoms are improving with the green sports insoles and scaphoid pads then I will likely proceed with custom orthotics. Call with questions or concerns in the interim.

## 2017-05-12 ENCOUNTER — Ambulatory Visit: Payer: BLUE CROSS/BLUE SHIELD | Admitting: Sports Medicine

## 2017-05-12 VITALS — BP 112/88 | Ht 61.0 in | Wt 212.0 lb

## 2017-05-12 DIAGNOSIS — M722 Plantar fascial fibromatosis: Secondary | ICD-10-CM | POA: Diagnosis not present

## 2017-05-12 NOTE — Progress Notes (Signed)
   Subjective:    Patient ID: Jacqueline Woodard, female    DOB: 21-Feb-1986, 32 y.o.   MRN: [id number]  HPI    Patient comes in today for follow-up on plantar fasciitis. Still struggling with pain. She does think the green sports insoles have been helpful but she feels like the scaphoid pad is too large. She has been doing her plantar fascial stretches. She has also tried icing but it is uncomfortable. Overall, symptoms may have improved slightly.     Review of Systems   as above  Objective:   Physical Exam  Well-developed, well-nourished. No acute distress. Awake alert and oriented 3. Vital signs reviewed  Right foot: Still tenderness to palpation at the calcaneal origin of the plantar fascia. A little less soft tissue swelling than on her previous visit. Negative calcaneal squeeze. Neurovascularly intact distally.       Assessment & Plan:   Plantar fasciitis  We went ahead and constructed custom orthotics for this patient today. She found them to be much more comfortable than the green sports insoles and scaphoid pads. I've encouraged her to continue with her plantar fascial stretching and I've reassured her that her symptoms should resolve, although it may be slow. I did discuss the possibility of formal physical therapy if she does not continue to improve. Follow-up with me as needed.  Total time spent with the patient was 30 minutes with greater than 50% of the time spent in face-to-face consultation discussing orthotic instruction, instruction, and fitting.  Patient was fitted for a : standard, cushioned, semi-rigid orthotic. The orthotic was heated and afterward the patient stood on the orthotic blank positioned on the orthotic stand. The patient was positioned in subtalar neutral position and 10 degrees of ankle dorsiflexion in a weight bearing stance. After completion of molding, a stable base was applied to the orthotic blank. The blank was ground to a stable position for  weight bearing. Size: 6 Base: Blue EVA Posting: none Additional orthotic padding: none

## 2017-06-17 ENCOUNTER — Other Ambulatory Visit: Payer: Self-pay | Admitting: Gynecology

## 2017-06-17 DIAGNOSIS — R9389 Abnormal findings on diagnostic imaging of other specified body structures: Secondary | ICD-10-CM

## 2017-06-18 ENCOUNTER — Ambulatory Visit (INDEPENDENT_AMBULATORY_CARE_PROVIDER_SITE_OTHER): Payer: BLUE CROSS/BLUE SHIELD

## 2017-06-18 ENCOUNTER — Ambulatory Visit: Payer: BLUE CROSS/BLUE SHIELD | Admitting: Gynecology

## 2017-06-18 ENCOUNTER — Other Ambulatory Visit: Payer: Self-pay | Admitting: Gynecology

## 2017-06-18 ENCOUNTER — Encounter: Payer: Self-pay | Admitting: Gynecology

## 2017-06-18 VITALS — BP 124/80

## 2017-06-18 DIAGNOSIS — N840 Polyp of corpus uteri: Secondary | ICD-10-CM

## 2017-06-18 DIAGNOSIS — N921 Excessive and frequent menstruation with irregular cycle: Secondary | ICD-10-CM | POA: Diagnosis not present

## 2017-06-18 DIAGNOSIS — E282 Polycystic ovarian syndrome: Secondary | ICD-10-CM

## 2017-06-18 DIAGNOSIS — R9389 Abnormal findings on diagnostic imaging of other specified body structures: Secondary | ICD-10-CM | POA: Diagnosis not present

## 2017-06-18 DIAGNOSIS — N946 Dysmenorrhea, unspecified: Secondary | ICD-10-CM | POA: Diagnosis not present

## 2017-06-18 DIAGNOSIS — N939 Abnormal uterine and vaginal bleeding, unspecified: Secondary | ICD-10-CM | POA: Diagnosis not present

## 2017-06-18 NOTE — Patient Instructions (Addendum)
Office will call with biopsy results from the ultrasound and to schedule surgery

## 2017-06-18 NOTE — Progress Notes (Signed)
    Jacqueline Woodard [birth year] [id number]        32 y.o.  G0P0000 presents for sonohysterogram due to a 10-month history of worsening menorrhagia and dysmenorrhea.  Patient had an ultrasound 01/2017 which showed an endometrial echo of 11.8 mm with a heterogenous pattern with internal vascularity suggesting an endometrial lesion.  Past medical history,surgical history, problem list, medications, allergies, family history and social history were all reviewed and documented in the EPIC chart.  Directed ROS with pertinent positives and negatives documented in the history of present illness/assessment and plan.  Exam: [place] assistant General appearance:  Normal Abdomen soft nontender without masses guarding rebound Pelvic external BUS vagina with normal.  Cervix normal.  Uterus grossly normal size midline mobile nontender.  Adnexa without masses or tenderness.  Ultrasound transvaginal and transabdominal shows uterus normal size and echotexture.  Endometrial echo 13.5 mm with color flow to a focus within the endometrium measuring 21 x 13 mm.  Right and left ovaries with peripheral follicular development suggesting PCOS pattern, otherwise normal.  Cul-de-sac with 13 x 15 mm fluid.  Sonohysterogram performed, sterile technique, easy catheter introduction with good distention and multiple defects consistent with polyps noted measuring 42 x 7 x 13 mm and 31 x 8 x 15 mm.  Endometrial sample taken.  Patient tolerated well.  Assessment/Plan:  32 y.o. G0P0000 with worsening menorrhagia and dysmenorrhea.  Ultrasound consistent with endometrial polyps.  Reviewed with the  patient my recommendation to proceed with hysteroscopy D&C with resection of the endometrial polyps.  I reviewed with is involved with the procedure to include the intraoperative and postoperative courses.  Risks to include damage to internal structures such as vagina, cervix and uterus as well as perforation with internal organ damage  necessitating major exploratory reparative surgeries was reviewed.  The risks of infection and prolonged antibiotics as well as a risk of hemorrhage and transfusion was also discussed.  The patient will follow-up for the biopsy results from the ultrasound and to schedule her hysteroscopy D&C.  We also discussed the PCOS pattern of her ovaries which I think is accounting for her less than regular menses.  She notes that she did have a regular clockwork menses but then gained weight and they became more irregular.  I discussed the benefits of weight loss and regulation of her menses.    Anastasio Auerbach MD, 12:38 PM 06/18/2017

## 2017-06-19 ENCOUNTER — Telehealth: Payer: Self-pay

## 2017-06-19 NOTE — Telephone Encounter (Signed)
Called patient to discuss scheduling surgery. Reviewed her insurance benefits and her estimated GGA surgery prepymt due by one week before surgery with her.  We discussed the next three available dates. She wants to talk with her work and will call me back and let me know when she would like to schedule.

## 2017-07-02 ENCOUNTER — Telehealth: Payer: Self-pay

## 2017-07-02 MED ORDER — MISOPROSTOL 200 MCG PO TABS: ORAL_TABLET | ORAL | 0 refills | Status: DC

## 2017-07-02 NOTE — Telephone Encounter (Signed)
Patient called stating she was ready to schedule surgery. We had previously discussed ins benefits and her est GGA surgery prepymt amount due by week before surgery. We discussed dates and she decided on 09/05/17 at 7:30am at [facility].  Pre op appt with TF is scheduled for 08/27/17 at 4:00pm.    I discussed with patient need for Cytotec tab vaginally hs before surgery and that Rx was sent. Warned her about possibility of spotting/bleeding and menstrual like cramps overnight with the Cytotec and just means it is working and no worries.

## 2017-07-10 ENCOUNTER — Telehealth: Payer: Self-pay | Admitting: Internal Medicine

## 2017-07-10 NOTE — Telephone Encounter (Signed)
Pt calling concerning her allergy to shellfish. She was wanting to know her severity of the allergy and if she may need an epi pen for the allergy and if Juleen China would be the dr to provide that epi pen. Pt said she starts to get a reaction from just smelling shellfish. Please call pt to discuss this.

## 2017-07-10 NOTE — Telephone Encounter (Signed)
LMOVM for pt to return call.  I need more information.  What does she mean by she "starts to get a reaction from just smelling shellfish".  What are the reactions? Swelling, SOB, itching etc  Yes we could possibly Rx the epipen (if the provider does not want to send her to an allergist) but she would need an appointment prior to an Rx or referral. [place], Jacqueline Woodard, [place]

## 2017-07-11 NOTE — Telephone Encounter (Signed)
Agree! Thank you Janett Billow!

## 2017-07-18 ENCOUNTER — Encounter: Payer: Self-pay | Admitting: Gynecology

## 2017-08-27 ENCOUNTER — Ambulatory Visit: Payer: BLUE CROSS/BLUE SHIELD | Admitting: Gynecology

## 2017-08-27 ENCOUNTER — Encounter: Payer: Self-pay | Admitting: Gynecology

## 2017-08-27 VITALS — BP 140/84

## 2017-08-27 DIAGNOSIS — N840 Polyp of corpus uteri: Secondary | ICD-10-CM

## 2017-08-27 DIAGNOSIS — N924 Excessive bleeding in the premenopausal period: Secondary | ICD-10-CM

## 2017-08-27 NOTE — Patient Instructions (Signed)
Followup for surgery as scheduled. 

## 2017-08-27 NOTE — H&P (Signed)
ADDISSON FRATE 08/19/1985 [id number]   History and Physical  Chief complaint: Menorrhagia, endometrial polyps  History of present illness: 32 y.o. G0P0000 with 6 month history of worsening menorrhagia and dysmenorrhea.  Sonohysterogram shows uterus normal size and echotexture with endometrial echo 13.5 mm with color flow within a focus within the endometrium consistent with polyp.  Subsequent sonohysterogram showed multiple defects consistent with endometrial polyps.  Endometrial biopsy showed proliferative endometrium.  Patient is admitted for hysteroscopy D&C with resection of the endometrial polyps.    Past Medical History:  Diagnosis Date  . Asthma    no inhaler use in months  . GERD (gastroesophageal reflux disease)   . History of gastric ulcer   . Lymphedema of leg    right - no current med.  . Macromastia 02/2013  . Migraine     Past Surgical History:  Procedure Laterality Date  . BREAST REDUCTION SURGERY Bilateral 03/01/2013   Procedure: BILATERAL MAMMARY REDUCTION  (BREAST);  Surgeon: Cristine Polio, MD;  Location: [place];  Service: Plastics;  Laterality: Bilateral;  . WISDOM TOOTH EXTRACTION      Family History  Problem Relation Age of Onset  . Thyroid disease Mother     Social History:  reports that she has never smoked. She has never used smokeless tobacco. She reports that she drinks alcohol. She reports that she does not use drugs.  Allergies:  Allergies  Allergen Reactions  . Shellfish Allergy Swelling    SWELLING OF FACE AND THROAT  . Vancomycin Other (See Comments)    Kidney failure    Medications: See EPIC for most current list  ROS:  Was performed and pertinent positives and negatives are included in the history of present illness.  [name] assistant 08/27/2017    Vitals:   08/27/17 1555  BP: 140/84   General appearance:  Normal affect, orientation and appearance. Skin: Grossly normal HEENT: Without gross  lesions.  No cervical or supraclavicular adenopathy. Thyroid normal.  Lungs:  Clear without wheezing, rales or rhonchi Cardiac: RR, without RMG Abdominal:  Soft, nontender, without masses, guarding, rebound, organomegaly or hernia Pelvic:             Ext, BUS, Vagina: Normal             Cervix: Normal             Uterus: Anteverted, normal size, shape and contour, midline and mobile nontender              Adnexa: Without masses or tenderness               Anus and perineum: Normal    Assessment/Plan:  32 y.o. G0P0000 with worsening menorrhagia and dysmenorrhea.  Sonohysterogram shows uterus normal size and echotexture with endometrial echo 13.5 mm with color flow within a focus within the endometrium consistent with polyp.  Subsequent sonohysterogram showed multiple defects consistent with endometrial polyps.  Endometrial biopsy showed proliferative endometrium.  Patient is admitted for hysteroscopy D&C with resection of the endometrial polyps.  I reviewed the proposed surgery with the patient to include the expected intraoperative and postoperative courses as well as the recovery period. The use of the hysteroscope, resectoscope and the D&C portion were all discussed. The risks of surgery to include infection, prolonged antibiotics, hemorrhage necessitating transfusion and the risks of transfusion, including transfusion reaction, hepatitis, HIV, mad cow disease and other unknown entities were all discussed understood and accepted. The risk of damage to internal organs  during the procedure, either immediately recognized or delay recognized, including vagina, cervix, uterus, possible perforation causing damage to bowel, bladder, ureters, vessels and nerves necessitating major exploratory reparative surgery and future reparative surgeries including bladder repair, ureteral damage repair, bowel resection, ostomy formation was also discussed understood and accepted.  She understands there are no guarantees  that this will relieve her menorrhagia and her periods may continue heavy or get worse. The patient's questions were answered to her satisfaction and she is ready to proceed with surgery.   Anastasio Auerbach MD, 4:17 PM 08/27/2017

## 2017-08-27 NOTE — Progress Notes (Signed)
    ARELI FRARY 03/15/1986 [id number]        32 y.o.  G0P0000 presents for preoperative consult for upcoming hysteroscopy D&C due to worsening menorrhagia, dysmenorrhea with sonohysterogram showing endometrial polyps.  Past medical history,surgical history, problem list, medications, allergies, family history and social history were all reviewed and documented as reviewed in the EPIC chart.  ROS:  Performed with pertinent positives and negatives included in the history, assessment and plan.   Additional significant findings : None   Exam: [name] assistant Vitals:   08/27/17 1555  BP: 140/84   General appearance:  Normal affect, orientation and appearance. Skin: Grossly normal HEENT: Without gross lesions.  No cervical or supraclavicular adenopathy. Thyroid normal.  Lungs:  Clear without wheezing, rales or rhonchi Cardiac: RR, without RMG Abdominal:  Soft, nontender, without masses, guarding, rebound, organomegaly or hernia Pelvic:  Ext, BUS, Vagina: Normal  Cervix: Normal  Uterus: Anteverted, normal size, shape and contour, midline and mobile nontender   Adnexa: Without masses or tenderness    Anus and perineum: Normal    Assessment/Plan:  32 y.o. G0P0000 female with worsening menorrhagia and dysmenorrhea.  Sonohysterogram shows uterus normal size and echotexture with endometrial echoes 13.5 mm with color flow within a focus within the endometrium consistent with polyp.  Subsequent sonohysterogram showed multiple defects consistent with endometrial polyps.  Endometrial biopsy showed proliferative endometrium.  Patient is admitted for hysteroscopy D&C with resection of the endometrial polyps.  I reviewed the proposed surgery with the patient to include the expected intraoperative and postoperative courses as well as the recovery period. The use of the hysteroscope, resectoscope and the D&C portion were all discussed. The risks of surgery to include infection, prolonged  antibiotics, hemorrhage necessitating transfusion and the risks of transfusion, including transfusion reaction, hepatitis, HIV, mad cow disease and other unknown entities were all discussed understood and accepted. The risk of damage to internal organs during the procedure, either immediately recognized or delay recognized, including vagina, cervix, uterus, possible perforation causing damage to bowel, bladder, ureters, vessels and nerves necessitating major exploratory reparative surgery and future reparative surgeries including bladder repair, ureteral damage repair, bowel resection, ostomy formation was also discussed understood and accepted.  She understands there are no guarantees that this will relieve her menorrhagia and her periods may continue heavy or get worse. The patient's questions were answered to her satisfaction and she is ready to proceed with surgery.    Anastasio Auerbach MD, 4:12 PM 08/27/2017

## 2017-08-29 ENCOUNTER — Other Ambulatory Visit: Payer: Self-pay

## 2017-08-29 ENCOUNTER — Encounter (HOSPITAL_BASED_OUTPATIENT_CLINIC_OR_DEPARTMENT_OTHER): Payer: Self-pay | Admitting: *Deleted

## 2017-08-29 NOTE — Progress Notes (Signed)
Spoke w/ pt via phone for pre-op interview. Npo after mn.  Arrive at 0530.  Getting cbc and hCG serum done Wednesday 09-03-2017 @ 1400.  Will take nexium am dos w/ sips of water.

## 2017-09-03 ENCOUNTER — Encounter (HOSPITAL_COMMUNITY)
Admission: RE | Admit: 2017-09-03 | Discharge: 2017-09-03 | Disposition: A | Discharge status: Home / Self Care | Payer: BLUE CROSS/BLUE SHIELD | Source: Ambulatory Visit | Attending: Gynecology | Admitting: Gynecology

## 2017-09-03 DIAGNOSIS — Z91013 Allergy to seafood: Secondary | ICD-10-CM | POA: Diagnosis not present

## 2017-09-03 DIAGNOSIS — N62 Hypertrophy of breast: Secondary | ICD-10-CM | POA: Diagnosis not present

## 2017-09-03 DIAGNOSIS — N946 Dysmenorrhea, unspecified: Secondary | ICD-10-CM | POA: Diagnosis not present

## 2017-09-03 DIAGNOSIS — N840 Polyp of corpus uteri: Secondary | ICD-10-CM | POA: Diagnosis not present

## 2017-09-03 DIAGNOSIS — N920 Excessive and frequent menstruation with regular cycle: Secondary | ICD-10-CM | POA: Diagnosis not present

## 2017-09-03 DIAGNOSIS — Z6841 Body Mass Index (BMI) 40.0 and over, adult: Secondary | ICD-10-CM | POA: Diagnosis not present

## 2017-09-03 DIAGNOSIS — Z881 Allergy status to other antibiotic agents status: Secondary | ICD-10-CM | POA: Diagnosis not present

## 2017-09-03 DIAGNOSIS — K219 Gastro-esophageal reflux disease without esophagitis: Secondary | ICD-10-CM | POA: Diagnosis not present

## 2017-09-03 LAB — CBC
HCT: 34.8 % — ABNORMAL LOW (ref 36.0–46.0)
HEMOGLOBIN: 10.9 g/dL — AB (ref 12.0–15.0)
MCH: 25.7 pg — ABNORMAL LOW (ref 26.0–34.0)
MCHC: 31.3 g/dL (ref 30.0–36.0)
MCV: 82.1 fL (ref 78.0–100.0)
Platelets: 271 10*3/uL (ref 150–400)
RBC: 4.24 MIL/uL (ref 3.87–5.11)
RDW: 13.7 % (ref 11.5–15.5)
WBC: 7 10*3/uL (ref 4.0–10.5)

## 2017-09-03 LAB — HCG, SERUM, QUALITATIVE: PREG SERUM: NEGATIVE

## 2017-09-05 ENCOUNTER — Encounter (HOSPITAL_BASED_OUTPATIENT_CLINIC_OR_DEPARTMENT_OTHER): Payer: Self-pay | Admitting: *Deleted

## 2017-09-05 ENCOUNTER — Other Ambulatory Visit: Payer: Self-pay

## 2017-09-05 ENCOUNTER — Ambulatory Visit (HOSPITAL_BASED_OUTPATIENT_CLINIC_OR_DEPARTMENT_OTHER): Payer: BLUE CROSS/BLUE SHIELD | Admitting: Anesthesiology

## 2017-09-05 ENCOUNTER — Ambulatory Visit (HOSPITAL_BASED_OUTPATIENT_CLINIC_OR_DEPARTMENT_OTHER)
Admission: RE | Admit: 2017-09-05 | Discharge: 2017-09-05 | Disposition: A | Discharge status: Home / Self Care | Payer: BLUE CROSS/BLUE SHIELD | Source: Ambulatory Visit | Attending: Gynecology | Admitting: Gynecology

## 2017-09-05 ENCOUNTER — Encounter (HOSPITAL_BASED_OUTPATIENT_CLINIC_OR_DEPARTMENT_OTHER)
Admission: RE | Disposition: A | Discharge status: Home / Self Care | Payer: Self-pay | Source: Ambulatory Visit | Attending: Gynecology

## 2017-09-05 DIAGNOSIS — R9389 Abnormal findings on diagnostic imaging of other specified body structures: Secondary | ICD-10-CM

## 2017-09-05 DIAGNOSIS — N920 Excessive and frequent menstruation with regular cycle: Secondary | ICD-10-CM | POA: Diagnosis not present

## 2017-09-05 DIAGNOSIS — Z91013 Allergy to seafood: Secondary | ICD-10-CM | POA: Insufficient documentation

## 2017-09-05 DIAGNOSIS — K219 Gastro-esophageal reflux disease without esophagitis: Secondary | ICD-10-CM | POA: Diagnosis not present

## 2017-09-05 DIAGNOSIS — N946 Dysmenorrhea, unspecified: Secondary | ICD-10-CM | POA: Insufficient documentation

## 2017-09-05 DIAGNOSIS — N840 Polyp of corpus uteri: Secondary | ICD-10-CM | POA: Insufficient documentation

## 2017-09-05 DIAGNOSIS — Z6841 Body Mass Index (BMI) 40.0 and over, adult: Secondary | ICD-10-CM | POA: Insufficient documentation

## 2017-09-05 DIAGNOSIS — N62 Hypertrophy of breast: Secondary | ICD-10-CM | POA: Diagnosis not present

## 2017-09-05 DIAGNOSIS — J45909 Unspecified asthma, uncomplicated: Secondary | ICD-10-CM | POA: Diagnosis not present

## 2017-09-05 DIAGNOSIS — N841 Polyp of cervix uteri: Secondary | ICD-10-CM | POA: Diagnosis not present

## 2017-09-05 DIAGNOSIS — G43909 Migraine, unspecified, not intractable, without status migrainosus: Secondary | ICD-10-CM | POA: Diagnosis not present

## 2017-09-05 DIAGNOSIS — Z881 Allergy status to other antibiotic agents status: Secondary | ICD-10-CM | POA: Diagnosis not present

## 2017-09-05 HISTORY — DX: Presence of spectacles and contact lenses: Z97.3

## 2017-09-05 HISTORY — DX: Personal history of other diseases of urinary system: Z87.448

## 2017-09-05 HISTORY — DX: Polyp of corpus uteri: N84.0

## 2017-09-05 HISTORY — PX: DILATION AND CURETTAGE OF UTERUS: SHX78

## 2017-09-05 HISTORY — DX: Unspecified asthma, uncomplicated: J45.909

## 2017-09-05 HISTORY — DX: Chronic migraine without aura, not intractable, without status migrainosus: G43.709

## 2017-09-05 HISTORY — DX: Reserved for concepts with insufficient information to code with codable children: IMO0002

## 2017-09-05 HISTORY — DX: Iron deficiency anemia, unspecified: D50.9

## 2017-09-05 HISTORY — DX: Personal history of diseases of the skin and subcutaneous tissue: Z87.2

## 2017-09-05 HISTORY — DX: Personal history of other infectious and parasitic diseases: Z86.19

## 2017-09-05 HISTORY — DX: Hereditary lymphedema: Q82.0

## 2017-09-05 HISTORY — DX: Personal history of other specified conditions: Z87.898

## 2017-09-05 HISTORY — PX: DILATATION & CURETTAGE/HYSTEROSCOPY WITH MYOSURE: SHX6511

## 2017-09-05 HISTORY — DX: Leiomyoma of uterus, unspecified: D25.9

## 2017-09-05 HISTORY — DX: Personal history of other diseases of the female genital tract: Z87.42

## 2017-09-05 LAB — POCT PREGNANCY, URINE: Preg Test, Ur: NEGATIVE

## 2017-09-05 SURGERY — DILATATION & CURETTAGE/HYSTEROSCOPY WITH MYOSURE
Anesthesia: General | Site: Uterus

## 2017-09-05 MED ORDER — SODIUM CHLORIDE 0.9 % IV SOLN
INTRAVENOUS | Status: AC
  Filled 2017-09-05: qty 2

## 2017-09-05 MED ORDER — OXYCODONE-ACETAMINOPHEN 5-325 MG PO TABS: 1.0000 | ORAL_TABLET | ORAL | 0 refills | Status: DC | PRN

## 2017-09-05 MED ORDER — MIDAZOLAM HCL 2 MG/2ML IJ SOLN
INTRAMUSCULAR | Status: DC | PRN
  Administered 2017-09-05: 1 mg via INTRAVENOUS

## 2017-09-05 MED ORDER — PROPOFOL 10 MG/ML IV BOLUS
INTRAVENOUS | Status: AC
  Filled 2017-09-05: qty 40

## 2017-09-05 MED ORDER — ONDANSETRON HCL 4 MG/2ML IJ SOLN
INTRAMUSCULAR | Status: AC
Start: 2017-09-05 — End: ?
  Filled 2017-09-05: qty 2

## 2017-09-05 MED ORDER — MIDAZOLAM HCL 2 MG/2ML IJ SOLN
0.5000 mg | Freq: Once | INTRAMUSCULAR | Status: DC | PRN
  Filled 2017-09-05: qty 2

## 2017-09-05 MED ORDER — KETOROLAC TROMETHAMINE 30 MG/ML IJ SOLN
INTRAMUSCULAR | Status: AC
  Filled 2017-09-05: qty 1

## 2017-09-05 MED ORDER — PROMETHAZINE HCL 25 MG/ML IJ SOLN
6.2500 mg | INTRAMUSCULAR | Status: DC | PRN
  Filled 2017-09-05: qty 1

## 2017-09-05 MED ORDER — ONDANSETRON HCL 4 MG/2ML IJ SOLN
INTRAMUSCULAR | Status: DC | PRN
  Administered 2017-09-05: 4 mg via INTRAVENOUS

## 2017-09-05 MED ORDER — LIDOCAINE HCL 1 % IJ SOLN
INTRAMUSCULAR | Status: DC | PRN
  Administered 2017-09-05: 10 mL

## 2017-09-05 MED ORDER — LACTATED RINGERS IV SOLN
INTRAVENOUS | Status: DC
  Administered 2017-09-05 (×2): via INTRAVENOUS
  Filled 2017-09-05: qty 1000

## 2017-09-05 MED ORDER — FENTANYL CITRATE (PF) 100 MCG/2ML IJ SOLN
INTRAMUSCULAR | Status: DC | PRN
  Administered 2017-09-05: 25 ug via INTRAVENOUS
  Administered 2017-09-05: 50 ug via INTRAVENOUS
  Administered 2017-09-05: 25 ug via INTRAVENOUS
  Administered 2017-09-05 (×2): 50 ug via INTRAVENOUS

## 2017-09-05 MED ORDER — SODIUM CHLORIDE 0.9 % IR SOLN
Status: DC | PRN
  Administered 2017-09-05: 1

## 2017-09-05 MED ORDER — FENTANYL CITRATE (PF) 100 MCG/2ML IJ SOLN
25.0000 ug | INTRAMUSCULAR | Status: DC | PRN
  Administered 2017-09-05: 25 ug via INTRAVENOUS
  Filled 2017-09-05: qty 1

## 2017-09-05 MED ORDER — KETOROLAC TROMETHAMINE 30 MG/ML IJ SOLN
INTRAMUSCULAR | Status: DC | PRN
  Administered 2017-09-05: 30 mg via INTRAVENOUS

## 2017-09-05 MED ORDER — MIDAZOLAM HCL 2 MG/2ML IJ SOLN
INTRAMUSCULAR | Status: AC
  Filled 2017-09-05: qty 2

## 2017-09-05 MED ORDER — PROPOFOL 10 MG/ML IV BOLUS
INTRAVENOUS | Status: DC | PRN
  Administered 2017-09-05: 200 mg via INTRAVENOUS

## 2017-09-05 MED ORDER — FENTANYL CITRATE (PF) 250 MCG/5ML IJ SOLN
INTRAMUSCULAR | Status: AC
Start: 2017-09-05 — End: ?
  Filled 2017-09-05: qty 5

## 2017-09-05 MED ORDER — LIDOCAINE HCL (CARDIAC) PF 100 MG/5ML IV SOSY
PREFILLED_SYRINGE | INTRAVENOUS | Status: DC | PRN
  Administered 2017-09-05: 80 mg via INTRAVENOUS

## 2017-09-05 MED ORDER — SODIUM CHLORIDE 0.9 % IV SOLN
2.0000 g | INTRAVENOUS | Status: AC
  Administered 2017-09-05: 2 g via INTRAVENOUS
  Filled 2017-09-05: qty 2

## 2017-09-05 MED ORDER — SCOPOLAMINE 1 MG/3DAYS TD PT72
MEDICATED_PATCH | TRANSDERMAL | Status: AC
  Filled 2017-09-05: qty 1

## 2017-09-05 MED ORDER — FENTANYL CITRATE (PF) 100 MCG/2ML IJ SOLN
INTRAMUSCULAR | Status: AC
  Filled 2017-09-05: qty 2

## 2017-09-05 MED ORDER — MEPERIDINE HCL 25 MG/ML IJ SOLN
6.2500 mg | INTRAMUSCULAR | Status: DC | PRN
  Filled 2017-09-05: qty 1

## 2017-09-05 MED ORDER — LIDOCAINE 2% (20 MG/ML) 5 ML SYRINGE
INTRAMUSCULAR | Status: AC
  Filled 2017-09-05: qty 5

## 2017-09-05 MED ORDER — DEXAMETHASONE SODIUM PHOSPHATE 4 MG/ML IJ SOLN
INTRAMUSCULAR | Status: DC | PRN
  Administered 2017-09-05: 8 mg via INTRAVENOUS

## 2017-09-05 MED ORDER — SCOPOLAMINE 1 MG/3DAYS TD PT72
1.0000 | MEDICATED_PATCH | Freq: Once | TRANSDERMAL | Status: DC
  Administered 2017-09-05: 1.5 mg via TRANSDERMAL
  Filled 2017-09-05: qty 1

## 2017-09-05 MED ORDER — DEXAMETHASONE SODIUM PHOSPHATE 10 MG/ML IJ SOLN
INTRAMUSCULAR | Status: AC
Start: 2017-09-05 — End: ?
  Filled 2017-09-05: qty 1

## 2017-09-05 SURGICAL SUPPLY — 24 items
CANISTER SUCT 3000ML PPV (MISCELLANEOUS) ×3 IMPLANT
CATH ROBINSON RED A/P 16FR (CATHETERS) ×3 IMPLANT
CLOTH BEACON ORANGE TIMEOUT ST (SAFETY) ×3 IMPLANT
COUNTER NEEDLE 1200 MAGNETIC (NEEDLE) ×3 IMPLANT
DEVICE MYOSURE LITE (MISCELLANEOUS) ×2 IMPLANT
DEVICE MYOSURE REACH (MISCELLANEOUS) IMPLANT
DILATOR CANAL MILEX (MISCELLANEOUS) IMPLANT
FILTER ARTHROSCOPY CONVERTOR (FILTER) ×3 IMPLANT
GLOVE BIO SURGEON STRL SZ7.5 (GLOVE) ×6 IMPLANT
GOWN STRL REUS W/TWL LRG LVL3 (GOWN DISPOSABLE) ×3 IMPLANT
IV NS IRRIG 3000ML ARTHROMATIC (IV SOLUTION) ×3 IMPLANT
KIT TURNOVER CYSTO (KITS) ×3 IMPLANT
MYOSURE XL FIBROID REM (MISCELLANEOUS)
NDL SAFETY ECLIPSE 18X1.5 (NEEDLE) IMPLANT
NEEDLE HYPO 18GX1.5 SHARP (NEEDLE)
PACK VAGINAL MINOR WOMEN LF (CUSTOM PROCEDURE TRAY) ×3 IMPLANT
PAD OB MATERNITY 4.3X12.25 (PERSONAL CARE ITEMS) ×3 IMPLANT
SEAL ROD LENS SCOPE MYOSURE (ABLATOR) ×3 IMPLANT
SYRINGE LUER LOK 1CC (MISCELLANEOUS) IMPLANT
SYSTEM TISS REMOVAL MYSR XL RM (MISCELLANEOUS) IMPLANT
TOWEL OR 17X24 6PK STRL BLUE (TOWEL DISPOSABLE) ×6 IMPLANT
TUBING AQUILEX INFLOW (TUBING) ×3 IMPLANT
TUBING AQUILEX OUTFLOW (TUBING) ×3 IMPLANT
WATER STERILE IRR 500ML POUR (IV SOLUTION) IMPLANT

## 2017-09-05 NOTE — Anesthesia Preprocedure Evaluation (Addendum)
Anesthesia Evaluation  Patient identified by MRN, date of birth, ID band Patient awake    Reviewed: Allergy & Precautions, NPO status , Patient's Chart, lab work & pertinent test results  History of Anesthesia Complications Negative for: history of anesthetic complications  Airway Mallampati: II  TM Distance: >3 FB Neck ROM: Full   Comment: Small mouth Dental  (+) Dental Advisory Given   Pulmonary asthma (last inhaler needed a few months ago) ,    breath sounds clear to auscultation       Cardiovascular negative cardio ROS   Rhythm:Regular Rate:Normal     Neuro/Psych  Headaches,    GI/Hepatic Neg liver ROS, GERD  Controlled,  Endo/Other  Morbid obesity  Renal/GU negative Renal ROS     Musculoskeletal   Abdominal (+) + obese,   Peds  Hematology Hb 10.9   Anesthesia Other Findings   Reproductive/Obstetrics LMP 08/22/17                            Anesthesia Physical Anesthesia Plan  ASA: III  Anesthesia Plan: General   Post-op Pain Management:    Induction: Intravenous  PONV Risk Score and Plan: 3 and Ondansetron, Dexamethasone and Scopolamine patch - Pre-op  Airway Management Planned: LMA  Additional Equipment:   Intra-op Plan:   Post-operative Plan:   Informed Consent: I have reviewed the patients History and Physical, chart, labs and discussed the procedure including the risks, benefits and alternatives for the proposed anesthesia with the patient or authorized representative who has indicated his/her understanding and acceptance.   Dental advisory given  Plan Discussed with: CRNA and Surgeon  Anesthesia Plan Comments: (Plan routine monitors, GA- LMA OK)        Anesthesia Quick Evaluation

## 2017-09-05 NOTE — Op Note (Signed)
Jacqueline Woodard [birth year] [id number]   Post Operative Note   Date of surgery:  09/05/2017  Pre Op Dx: Menorrhagia, dysmenorrhea, endometrial polyps  Post Op Dx: Menorrhagia, dysmenorrhea, endometrial polyps  Procedure: Hysteroscopy, D&C, Myosure resection endometrial polyps  Surgeon:  Belinda Block Sargon Scouten  Anesthesia:  General  EBL: 20 cc  Distended media discrepancy: 831 cc saline  Complications:  None  Specimen: #1 endometrial curetting #2 endometrial polyps to pathology  Findings: EUA: External BUS vagina normal.  Cervix normal.  Uterus grossly normal size midline mobile.  Adnexa without masses   Hysteroscopy: Adequate noting fundus, anterior/posterior endometrial surfaces, right/left tubal ostia, lower uterine segment and endocervical canal all visualized.  Endometrial cavity filled with polypoid endometrial tissue extending into the endocervical canal.  After resection endometrial cavity empty and normal in appearance.  Procedure:  The patient was taken to the operating room, was placed in the low dorsal lithotomy position, underwent general anesthesia, received a perineal/vaginal preparation per nursing personnel and the bladder was emptied with an in and out Foley catheterization. The timeout was performed by the surgical team. An EUA was performed. The patient was draped in the usual fashion. The cervix was visualized with a speculum, anterior lip grasped with a single-tooth tenaculum and a paracervical block was placed using 10 cc's of 1% lidocaine. The cervix was gently dilated to admit the Myosure hysteroscope and hysteroscopy was performed with findings noted above. Using the Myosure Lite resectoscopic wand the polyps were resected in their entirety to the level the surrounding endometrium. A gentle sharp curettage was performed. Both specimens were sent separately to pathology.  Repeat hysteroscopy showed an empty cavity with good distention and no evidence of perforation.  The instruments were removed and adequate hemostasis was visualized at the tenaculum site and external cervical os.  The specimens were identified for pathology.  The sponge, needle and instrument count were verified correct.  The patient was given intraoperative Toradol, was awakened without difficulty and was taken to the recovery room in good condition having tolerated the procedure well.    Anastasio Auerbach MD, 8:14 AM 09/05/2017

## 2017-09-05 NOTE — Discharge Instructions (Signed)
° °  Postoperative Instructions Hysteroscopy D & C  Dr. Phineas Real and the nursing staff have discussed postoperative instructions with you.  If you have any questions please ask them before you leave the hospital, or call Dr Elisabeth Most office at [phone].    We would like to emphasize the following instructions:   ? Call the office to make your follow-up appointment as recommended by Dr Phineas Real (usually 1-2 weeks).  ? You were given a prescription, or one was ordered for you at the pharmacy you designated.  Get that prescription filled and take the medication according to instructions.  ? You may eat a regular diet, but slowly until you start having bowel movements.  ? Drink plenty of water daily.  ? Nothing in the vagina (intercourse, douching, objects of any kind) for two weeks.  When reinitiating intercourse, if it is uncomfortable, stop and make an appointment with Dr Phineas Real to be evaluated.  ? No driving for one to two days until the effects of anesthesia has worn off.  No traveling out of town for several days.  ? You may shower, but no baths for one week.  Walking up and down stairs is ok.  No heavy lifting, prolonged standing, repeated bending or any working out until your post op check.  ? Rest frequently, listen to your body and do not push yourself and overdo it.  ? Call if:  o Your pain medication does not seem strong enough. o Worsening pain or abdominal bloating o Persistent nausea or vomiting o Difficulty with urination or bowel movements. o Temperature of 101 degrees or higher. o Heavy vaginal bleeding.  If your period is due, you may use tampons. o You have any questions or concerns   Do not take any nonsteroidal anti inflammatories (Ibuprofen, Aleve, Advil, Motrin) until after1:45 pm today.    Post Anesthesia Home Care Instructions  Activity: Get plenty of rest for the remainder of the day. A responsible individual must stay with you for 24 hours  following the procedure.  For the next 24 hours, DO NOT: -Drive a car -Paediatric nurse -Drink alcoholic beverages -Take any medication unless instructed by your physician -Make any legal decisions or sign important papers.  Meals: Start with liquid foods such as gelatin or soup. Progress to regular foods as tolerated. Avoid greasy, spicy, heavy foods. If nausea and/or vomiting occur, drink only clear liquids until the nausea and/or vomiting subsides. Call your physician if vomiting continues.  Special Instructions/Symptoms: Your throat may feel dry or sore from the anesthesia or the breathing tube placed in your throat during surgery. If this causes discomfort, gargle with warm salt water. The discomfort should disappear within 24 hours.  If you had a scopolamine patch placed behind your ear for the management of post- operative nausea and/or vomiting:  1. The medication in the patch is effective for 72 hours, after which it should be removed.  Wrap patch in a tissue and discard in the trash. Wash hands thoroughly with soap and water. 2. You may remove the patch earlier than 72 hours if you experience unpleasant side effects which may include dry mouth, dizziness or visual disturbances. 3. Avoid touching the patch. Wash your hands with soap and water after contact with the patch.

## 2017-09-05 NOTE — H&P (Signed)
The patient was examined.  I reviewed the proposed surgery and consent form with the patient.  The dictated history and physical is current and accurate and all questions were answered. The patient is ready to proceed with surgery and has a realistic understanding and expectation for the outcome.   Anastasio Auerbach MD, 7:15 AM 09/05/2017

## 2017-09-05 NOTE — Transfer of Care (Signed)
Immediate Anesthesia Transfer of Care Note  Patient: Jacqueline Woodard  Procedure(s) Performed: DILATATION & CURETTAGE/HYSTEROSCOPY WITH MYOSURE (N/A Uterus)  Patient Location: PACU  Anesthesia Type:General  Level of Consciousness: awake and patient cooperative  Airway & Oxygen Therapy: Patient Spontanous Breathing and Patient connected to nasal cannula oxygen  Post-op Assessment: Report given to RN and Post -op Vital signs reviewed and stable  Post vital signs: Reviewed and stable  Last Vitals:  Vitals Value Taken Time  BP 139/93 09/05/2017  8:15 AM  Temp    Pulse    Resp    SpO2    Vitals shown include unvalidated device data.  Last Pain:  Vitals:   09/05/17 0550  TempSrc: Oral         Complications: No apparent anesthesia complications

## 2017-09-05 NOTE — Anesthesia Postprocedure Evaluation (Signed)
Anesthesia Post Note  Patient: Jacqueline Woodard  Procedure(s) Performed: DILATATION & CURETTAGE/HYSTEROSCOPY WITH MYOSURE (N/A Uterus)     Patient location during evaluation: PACU Anesthesia Type: General Level of consciousness: awake and alert, oriented and patient cooperative Pain management: pain level controlled Vital Signs Assessment: post-procedure vital signs reviewed and stable Respiratory status: spontaneous breathing, nonlabored ventilation and respiratory function stable Cardiovascular status: blood pressure returned to baseline and stable Postop Assessment: no apparent nausea or vomiting Anesthetic complications: no    Last Vitals:  Vitals:   09/05/17 1015 09/05/17 1100  BP: 109/86 118/83  Pulse: 79 86  Resp: 18 18  Temp: 36.8 C 36.9 C  SpO2: 96% 99%    Last Pain:  Vitals:   09/05/17 1100  TempSrc:   PainSc: 2                  Railyn House,E. Trinh Sanjose

## 2017-09-05 NOTE — Anesthesia Procedure Notes (Signed)
Procedure Name: LMA Insertion Date/Time: 09/05/2017 7:35 AM Performed by: Georgeanne Nim, CRNA Pre-anesthesia Checklist: Patient identified, Emergency Drugs available, Suction available, Patient being monitored and Timeout performed Patient Re-evaluated:Patient Re-evaluated prior to induction Oxygen Delivery Method: Circle system utilized Preoxygenation: Pre-oxygenation with 100% oxygen Induction Type: IV induction Ventilation: Mask ventilation without difficulty LMA: LMA inserted LMA Size: 4.0 Number of attempts: 1 Placement Confirmation: positive ETCO2,  CO2 detector and breath sounds checked- equal and bilateral Tube secured with: Tape Dental Injury: Teeth and Oropharynx as per pre-operative assessment

## 2017-09-08 ENCOUNTER — Encounter (HOSPITAL_BASED_OUTPATIENT_CLINIC_OR_DEPARTMENT_OTHER): Payer: Self-pay | Admitting: Gynecology

## 2017-09-22 ENCOUNTER — Encounter: Payer: Self-pay | Admitting: Gynecology

## 2017-09-22 ENCOUNTER — Ambulatory Visit (INDEPENDENT_AMBULATORY_CARE_PROVIDER_SITE_OTHER): Payer: BLUE CROSS/BLUE SHIELD | Admitting: Gynecology

## 2017-09-22 VITALS — BP 122/78

## 2017-09-22 DIAGNOSIS — Z4889 Encounter for other specified surgical aftercare: Secondary | ICD-10-CM

## 2017-09-22 NOTE — Progress Notes (Signed)
    Jacqueline Woodard [birth year] [id number]        32 y.o.  G0P0000 presents for her postoperative visit status post hysteroscopic resection endometrial polyps.  She is done well without significant bleeding or pain.  Past medical history,surgical history, problem list, medications, allergies, family history and social history were all reviewed and documented in the EPIC chart.  Directed ROS with pertinent positives and negatives documented in the history of present illness/assessment and plan.  Exam: [name] assistant Vitals:   09/22/17 1519  BP: 122/78   General appearance:  Normal Abdomen soft nontender without masses guarding rebound Pelvic external BUS vagina normal.  Cervix normal.  Uterus grossly normal midline mobile nontender.  Adnexa without masses or tenderness.  Assessment/Plan:  32 y.o. G0P0000 with normal postoperative visit status post hysteroscopic resection endometrial polyps.  Reviewed benign pathology showing benign endometrial polyp.  Patient will keep menstrual calendar and follow-up this coming fall when due for her annual exam.  Sooner if any issues.    Anastasio Auerbach MD, 3:38 PM 09/22/2017

## 2017-09-22 NOTE — Patient Instructions (Signed)
Follow-up this coming fall when due for your annual exam.

## 2017-09-23 ENCOUNTER — Other Ambulatory Visit: Payer: Self-pay

## 2017-09-23 ENCOUNTER — Ambulatory Visit: Payer: BLUE CROSS/BLUE SHIELD | Admitting: Family Medicine

## 2017-09-23 ENCOUNTER — Encounter: Payer: Self-pay | Admitting: Family Medicine

## 2017-09-23 VITALS — BP 128/84 | HR 91 | Temp 98.2°F | Ht 61.25 in | Wt 220.0 lb

## 2017-09-23 DIAGNOSIS — J4520 Mild intermittent asthma, uncomplicated: Secondary | ICD-10-CM

## 2017-09-23 DIAGNOSIS — M545 Low back pain, unspecified: Secondary | ICD-10-CM | POA: Insufficient documentation

## 2017-09-23 DIAGNOSIS — G8929 Other chronic pain: Secondary | ICD-10-CM

## 2017-09-23 MED ORDER — CYCLOBENZAPRINE HCL 5 MG PO TABS: 5.0000 mg | ORAL_TABLET | Freq: Every day | ORAL | 0 refills | Status: DC

## 2017-09-23 MED ORDER — ALBUTEROL SULFATE HFA 108 (90 BASE) MCG/ACT IN AERS
2.0000 | INHALATION_SPRAY | Freq: Four times a day (QID) | RESPIRATORY_TRACT | 0 refills | Status: DC | PRN
Start: 2017-09-23 — End: 2017-10-11

## 2017-09-23 NOTE — Patient Instructions (Addendum)
Thank you for coming in to see [place] today! Please see below to review our plan for today's visit:  1. Follow through with referral for physical therapy for your back pain. 2. Take Flexeril, a muscle relaxer, each night to relieve muscle tension. 3. Continue using Albuterol every 6 hours as needed for asthma. 4. Try yoghurt before and after eating to help with acid reflux! 5. Return in three months or earlier if symptoms do not improve.   Please call the clinic at [phone] if your symptoms worsen or you have any concerns. It was our pleasure to serve you.  Milus Banister, [place], PGY-1 09/23/2017 5:02 PM
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
## 2017-10-08 ENCOUNTER — Ambulatory Visit: Payer: BLUE CROSS/BLUE SHIELD | Attending: Family Medicine | Admitting: Physical Therapy

## 2017-10-08 ENCOUNTER — Other Ambulatory Visit: Payer: Self-pay

## 2017-10-08 ENCOUNTER — Encounter: Payer: Self-pay | Admitting: Physical Therapy

## 2017-10-08 DIAGNOSIS — G8929 Other chronic pain: Secondary | ICD-10-CM | POA: Diagnosis not present

## 2017-10-08 DIAGNOSIS — M545 Low back pain: Secondary | ICD-10-CM | POA: Diagnosis not present

## 2017-10-08 DIAGNOSIS — M6281 Muscle weakness (generalized): Secondary | ICD-10-CM | POA: Insufficient documentation

## 2017-10-08 NOTE — Therapy (Addendum)
[facility] [address] [place], [place], [address] Phone: [phone]   Fax:  [phone]  Physical Therapy Evaluation  Patient Details  Name: Jacqueline Woodard MRN: [id number] Date of Birth: Jan 12, 1986 Referring Provider: Dollene Cleveland, DO   Encounter Date: 10/08/2017  PT End of Session - 10/08/17 1603    Visit Number  1    Number of Visits  13    Date for PT Re-Evaluation  11/21/17    Authorization Type  [employer] 90 visit limit    PT Start Time  1549    PT Stop Time  1627    PT Time Calculation (min)  38 min    Activity Tolerance  Patient tolerated treatment well    Behavior During Therapy  [facility] for tasks assessed/performed       Past Medical History:  Diagnosis Date  . Chronic migraine   . Endometrial polyp   . GERD (gastroesophageal reflux disease)   . Hereditary lymphedema of legs    chronic --- right > left  . History of acute renal failure    in setting cellulitis left lower leg 06/ 2008 and right lower leg cellulitis with sepsis  . History of borderline diabetes mellitus    08-29-2017 PER PT DX 2016 was checked again last year no longer pre-diabetic  . History of cellulitis    06/ 2008  left lower leg cellulitis:  12-05-2013  and 06-04-2014-- right lower leg cellulitis w/ sepsis  . History of Clostridium difficile infection 08/2006  . History of gastric ulcer 2015  . History of ovarian cyst   . History of sepsis    w/ right lower leg cellulitis 12-05-2013 and 06-04-2014  . Microcytic anemia   . Mild asthma    INHALER PRN-- LAST USED 2014  . Uterine fibroid   . Wears glasses     Past Surgical History:  Procedure Laterality Date  . BREAST REDUCTION SURGERY Bilateral 03/01/2013   Procedure: BILATERAL MAMMARY REDUCTION  (BREAST);  Surgeon: Louisa Second, MD;  Location: [facility];  Service: Plastics;  Laterality: Bilateral;  . BREAST SURGERY Bilateral 2015   Breast reduction  .  DILATATION & CURETTAGE/HYSTEROSCOPY WITH MYOSURE N/A 09/05/2017   Procedure: DILATATION & CURETTAGE/HYSTEROSCOPY WITH MYOSURE;  Surgeon: Dara Lords, MD;  Location: [facility];  Service: Gynecology;  Laterality: N/A;  requests 7:30am OR time Requests one hour  . DILATION AND CURETTAGE OF UTERUS  09/05/2017   Hyperplastic uterus, uterine polyp removal, irregular bleeding  . WISDOM TOOTH EXTRACTION      There were no vitals filed for this visit.   Subjective Assessment - 10/08/17 1558    Subjective  I have had it for a while but it is now lasting longer and is more constant. To the point where I am in pain before even gettingout of bed. I had to take frequent rest breaks due to pain. Beg in lower back and moves to bottom of bra strap and bil hips. It feels like somebody is squeezing in on my hips and pressing on back. Denies pain down legs. It takes a few minutes after sitting to decrease pain. Flexoril has been helpful. Feels that breast reduction was helpful for back pain. Denies regular exercise routine.     How long can you sit comfortably?  unlimited    How long can you stand comfortably?  4-5 hr of shift (when taking flexeril)    Patient Stated Goals  decrease  pain, work, care for 32 year old    Currently in Pain?  No/denies    Pain Score  -- up to about 5.5/10 yesterday    Pain Location  Back    Pain Orientation  Lower    Pain Descriptors / Indicators  -- pressure    Pain Type  Chronic pain                    Objective measurements completed on examination: See above findings.              PT Education - 10/08/17 1628    Education Details  anatomy of condition, POC, HEP, exercise form/rationale    Person(s) Educated  Patient    Methods  Explanation;Demonstration;Tactile cues;Verbal cues    Comprehension  Verbalized understanding;Need further instruction;Returned demonstration;Verbal cues required;Tactile cues required           PT Long Term Goals - 10/08/17 1630      PT LONG TERM GOAL #1   Title  pt will be able to get out of bed without pain    Baseline  pain before getting up in the am    Time  6    Period  Weeks    Status  New    Target Date  11/21/17      PT LONG TERM GOAL #2   Title  bil abduction strength to 5/5 for support to lumbopelvic region    Baseline  3/5 bil    Time  6    Period  Weeks    Status  New    Target Date  08/24/17      PT LONG TERM GOAL #3   Title  Pt will be able to complete work shifts without rest break required due to pain    Baseline  multiple required at eval    Time  6    Period  Weeks    Status  New    Target Date  11/21/17      PT LONG TERM GOAL #4   Title  FOTO to 25% limited    Baseline  37% limited at eval    Time  6    Period  Weeks    Status  New    Target Date  11/21/17      PT LONG TERM GOAL #5   Title  Pt will be independent in long term HEP for continued strengthening and care    Baseline  will progress as appropriate    Time  6    Period  Weeks    Status  New    Target Date  11/21/17             Plan - 10/08/17 1628    Clinical Impression Statement  Pt presents to PT with complaints of low back and bil hip pain. Lt anterior pelvic rotation noted resutling in functional LLD. Pt reported feeling a little better after exercises today. Significant weakness in bil abductors. Pt will benefit from skilled PT in order to improve lumbopelvic strength and stability to decrease pain.     Clinical Decision Making  Low    Rehab Potential  Good    PT Frequency  2x / week    PT Duration  6 weeks    PT Treatment/Interventions  ADLs/Self Care Home Management;Cryotherapy;Electrical Stimulation;Ultrasound;Traction;Moist Heat;Iontophoresis 4mg /ml Dexamethasone;Functional mobility training;Therapeutic activities;Therapeutic exercise;Balance training;Patient/family education;Neuromuscular re-education;Manual techniques;Dry needling;Passive range of  motion;Taping    PT Next Visit Plan  check rotation, lumbopelvic stabilization    Consulted and Agree with Plan of Care  Patient       Patient will benefit from skilled therapeutic intervention in order to improve the following deficits and impairments:  Pain, Improper body mechanics, Increased muscle spasms, Postural dysfunction, Decreased strength, Decreased activity tolerance, Decreased balance, Difficulty walking, Impaired flexibility  Visit Diagnosis: Chronic bilateral low back pain without sciatica - Plan: PT plan of care cert/re-cert  Muscle weakness (generalized) - Plan: PT plan of care cert/re-cert     Problem List Patient Active Problem List   Diagnosis Date Noted  . Low back pain without sciatica 09/23/2017  . Plantar fasciitis of right foot 02/19/2017  . Endometrial thickening on ultrasound 02/19/2017  . Paronychia of finger of right hand 12/29/2016  . Migraine 07/26/2006  . ASTHMA 07/26/2006  . HX, PERSONAL, EXPOSURE TO LEAD 07/26/2006    Ellice Boultinghouse C. Onika Gudiel PT, DPT 10/09/17 1:09 PM   [facility] [address] [place], [place], [address] Phone: [phone]   Fax:  [phone]  Name: MIHIKA DONOGHUE MRN: [id number] Date of Birth: 06-19-1985

## 2017-10-11 ENCOUNTER — Other Ambulatory Visit: Payer: Self-pay | Admitting: Family Medicine

## 2017-10-11 DIAGNOSIS — J4520 Mild intermittent asthma, uncomplicated: Secondary | ICD-10-CM

## 2017-10-12 ENCOUNTER — Other Ambulatory Visit: Payer: Self-pay | Admitting: Family Medicine

## 2017-10-17 ENCOUNTER — Encounter

## 2017-10-22 ENCOUNTER — Encounter: Payer: Self-pay | Admitting: Physical Therapy

## 2017-10-22 ENCOUNTER — Ambulatory Visit: Payer: BLUE CROSS/BLUE SHIELD | Attending: Family Medicine | Admitting: Physical Therapy

## 2017-10-22 DIAGNOSIS — M6281 Muscle weakness (generalized): Secondary | ICD-10-CM | POA: Diagnosis not present

## 2017-10-22 DIAGNOSIS — M545 Low back pain, unspecified: Secondary | ICD-10-CM

## 2017-10-22 DIAGNOSIS — G8929 Other chronic pain: Secondary | ICD-10-CM | POA: Insufficient documentation

## 2017-10-22 NOTE — Therapy (Signed)
patients    PT Home Exercise Plan  qped alt UE/LE lifts, LTR, GHJ flx with horiz abd tband, horiz abd red tband, tabletop holds       Patient will benefit from skilled therapeutic intervention in order to improve the following deficits and impairments:  Pain, Improper body mechanics, Increased muscle spasms, Postural dysfunction, Decreased strength, Decreased activity tolerance, Decreased balance, Difficulty walking, Impaired flexibility  Visit Diagnosis: Chronic bilateral low back pain without sciatica  Muscle weakness (generalized)     Problem List Patient Active Problem List   Diagnosis Date Noted  . Low back pain without sciatica 09/23/2017  . Plantar fasciitis of right foot 02/19/2017  . Endometrial thickening on ultrasound 02/19/2017  . Paronychia of finger of right hand 12/29/2016  . Migraine 07/26/2006  . ASTHMA 07/26/2006  . HX, PERSONAL, EXPOSURE TO LEAD 07/26/2006   Brysin Towery C. Ayiana Winslett PT, DPT 10/22/17 2:46 PM   [facility] [address] [place], [place], [address] Phone: [phone]   Fax:  [phone]  Name: Jacqueline Woodard MRN: [id number] Date of Birth: [birth year]  [place], [place], [address] Phone: [phone]   Fax:  [phone]  Physical Therapy Treatment  Patient Details  Name: Jacqueline Woodard MRN: [id number] Date of Birth: 09-07-1985 Referring Provider: Daisy Floro, DO   Encounter Date: 10/22/2017  PT End of Session - 10/22/17 1402    Visit Number  2    Number of Visits  13    Date for PT Re-Evaluation  11/21/17    Authorization Type  BC[employer] 90 visit limit    PT Start Time  1402    PT Stop Time  1441    PT Time Calculation (min)  39 min    Activity Tolerance  Patient tolerated treatment well    Behavior During Therapy  [facility] for tasks assessed/performed       Past Medical History:  Diagnosis Date  . Chronic migraine   . Endometrial polyp   . GERD (gastroesophageal reflux disease)   . Hereditary lymphedema of legs    chronic --- right > left  . History of acute renal failure    in setting cellulitis left lower leg 06/ 2008 and right lower leg cellulitis with sepsis  . History of borderline diabetes mellitus    08-29-2017 PER PT DX 2016 was checked again last year no longer pre-diabetic  . History of cellulitis    06/ 2008  left lower leg cellulitis:  12-05-2013  and 06-04-2014-- right lower leg cellulitis w/ sepsis  . History of Clostridium difficile infection 08/2006  . History of gastric ulcer 2015  . History of ovarian cyst   . History of sepsis    w/ right lower leg cellulitis 12-05-2013 and 06-04-2014  . Microcytic anemia   . Mild asthma    INHALER PRN-- LAST USED 2014  . Uterine fibroid   . Wears glasses     Past Surgical History:  Procedure Laterality Date  . BREAST REDUCTION SURGERY Bilateral 03/01/2013   Procedure: BILATERAL MAMMARY REDUCTION  (BREAST);  Surgeon: Cristine Polio, MD;  Location: [place];  Service: Plastics;  Laterality: Bilateral;  . BREAST SURGERY Bilateral 2015   Breast reduction  .  DILATATION & CURETTAGE/HYSTEROSCOPY WITH MYOSURE N/A 09/05/2017   Procedure: DILATATION & CURETTAGE/HYSTEROSCOPY WITH MYOSURE;  Surgeon: Anastasio Auerbach, MD;  Location: [place];  Service: Gynecology;  Laterality: N/A;  requests 7:30am OR time Requests one hour  . DILATION AND CURETTAGE OF UTERUS  09/05/2017   Hyperplastic uterus, uterine polyp removal, irregular bleeding  . WISDOM TOOTH EXTRACTION      There were no vitals filed for this visit.  Subjective Assessment - 10/22/17 1402    Subjective  It has been alright. I am not having any back pain today. I have been off of work so I have felt pretty good since Saturday.     Patient Stated Goals  decrease pain, work, care for 31 year old    Currently in Pain?  No/denies                       [facility] Adult PT Treatment/Exercise - 10/22/17 0001      Lumbar Exercises: Stretches   Single Knee to Chest Stretch  Right;Left;2 reps;10 seconds      Lumbar Exercises: Standing   Other Standing Lumbar Exercises  hip hinge      Lumbar Exercises: Seated   Other Seated Lumbar Exercises  seated trunk rotation    Other  [place], [place], [address] Phone: [phone]   Fax:  [phone]  Physical Therapy Treatment  Patient Details  Name: Jacqueline Woodard MRN: [id number] Date of Birth: 09-07-1985 Referring Provider: Daisy Floro, DO   Encounter Date: 10/22/2017  PT End of Session - 10/22/17 1402    Visit Number  2    Number of Visits  13    Date for PT Re-Evaluation  11/21/17    Authorization Type  [employer]BS 90 visit limit    PT Start Time  1402    PT Stop Time  1441    PT Time Calculation (min)  39 min    Activity Tolerance  Patient tolerated treatment well    Behavior During Therapy  [facility] for tasks assessed/performed       Past Medical History:  Diagnosis Date  . Chronic migraine   . Endometrial polyp   . GERD (gastroesophageal reflux disease)   . Hereditary lymphedema of legs    chronic --- right > left  . History of acute renal failure    in setting cellulitis left lower leg 06/ 2008 and right lower leg cellulitis with sepsis  . History of borderline diabetes mellitus    08-29-2017 PER PT DX 2016 was checked again last year no longer pre-diabetic  . History of cellulitis    06/ 2008  left lower leg cellulitis:  12-05-2013  and 06-04-2014-- right lower leg cellulitis w/ sepsis  . History of Clostridium difficile infection 08/2006  . History of gastric ulcer 2015  . History of ovarian cyst   . History of sepsis    w/ right lower leg cellulitis 12-05-2013 and 06-04-2014  . Microcytic anemia   . Mild asthma    INHALER PRN-- LAST USED 2014  . Uterine fibroid   . Wears glasses     Past Surgical History:  Procedure Laterality Date  . BREAST REDUCTION SURGERY Bilateral 03/01/2013   Procedure: BILATERAL MAMMARY REDUCTION  (BREAST);  Surgeon: Cristine Polio, MD;  Location: [facility];  Service: Plastics;  Laterality: Bilateral;  . BREAST SURGERY Bilateral 2015   Breast reduction  .  DILATATION & CURETTAGE/HYSTEROSCOPY WITH MYOSURE N/A 09/05/2017   Procedure: DILATATION & CURETTAGE/HYSTEROSCOPY WITH MYOSURE;  Surgeon: Anastasio Auerbach, MD;  Location: [place];  Service: Gynecology;  Laterality: N/A;  requests 7:30am OR time Requests one hour  . DILATION AND CURETTAGE OF UTERUS  09/05/2017   Hyperplastic uterus, uterine polyp removal, irregular bleeding  . WISDOM TOOTH EXTRACTION      There were no vitals filed for this visit.  Subjective Assessment - 10/22/17 1402    Subjective  It has been alright. I am not having any back pain today. I have been off of work so I have felt pretty good since Saturday.     Patient Stated Goals  decrease pain, work, care for 31 year old    Currently in Pain?  No/denies                       [facility] Adult PT Treatment/Exercise - 10/22/17 0001      Lumbar Exercises: Stretches   Single Knee to Chest Stretch  Right;Left;2 reps;10 seconds      Lumbar Exercises: Standing   Other Standing Lumbar Exercises  hip hinge      Lumbar Exercises: Seated   Other Seated Lumbar Exercises  seated trunk rotation    Other

## 2017-10-24 ENCOUNTER — Encounter

## 2017-10-28 ENCOUNTER — Ambulatory Visit: Payer: BLUE CROSS/BLUE SHIELD | Admitting: Physical Therapy

## 2017-10-28 ENCOUNTER — Encounter: Payer: Self-pay | Admitting: Physical Therapy

## 2017-10-28 DIAGNOSIS — M6281 Muscle weakness (generalized): Secondary | ICD-10-CM

## 2017-10-28 DIAGNOSIS — M545 Low back pain, unspecified: Secondary | ICD-10-CM

## 2017-10-28 DIAGNOSIS — G8929 Other chronic pain: Secondary | ICD-10-CM | POA: Diagnosis not present

## 2017-10-28 NOTE — Therapy (Signed)
[place], [place], [address] Phone: [phone]   Fax:  [phone]  Physical Therapy Treatment  Patient Details  Name: Jacqueline Woodard MRN: [id number] Date of Birth: 11/29/1985 Referring Provider: Daisy Floro, DO   Encounter Date: 10/28/2017  PT End of Session - 10/28/17 1110    Visit Number  3    Number of Visits  13    Date for PT Re-Evaluation  11/21/17    Authorization Type  [employer]BS 90 visit limit    PT Start Time  1103    PT Stop Time  1145    PT Time Calculation (min)  42 min       Past Medical History:  Diagnosis Date  . Chronic migraine   . Endometrial polyp   . GERD (gastroesophageal reflux disease)   . Hereditary lymphedema of legs    chronic --- right > left  . History of acute renal failure    in setting cellulitis left lower leg 06/ 2008 and right lower leg cellulitis with sepsis  . History of borderline diabetes mellitus    08-29-2017 PER PT DX 2016 was checked again last year no longer pre-diabetic  . History of cellulitis    06/ 2008  left lower leg cellulitis:  12-05-2013  and 06-04-2014-- right lower leg cellulitis w/ sepsis  . History of Clostridium difficile infection 08/2006  . History of gastric ulcer 2015  . History of ovarian cyst   . History of sepsis    w/ right lower leg cellulitis 12-05-2013 and 06-04-2014  . Microcytic anemia   . Mild asthma    INHALER PRN-- LAST USED 2014  . Uterine fibroid   . Wears glasses     Past Surgical History:  Procedure Laterality Date  . BREAST REDUCTION SURGERY Bilateral 03/01/2013   Procedure: BILATERAL MAMMARY REDUCTION  (BREAST);  Surgeon: Cristine Polio, MD;  Location: [place];  Service: Plastics;  Laterality: Bilateral;  . BREAST SURGERY Bilateral 2015   Breast reduction  . DILATATION & CURETTAGE/HYSTEROSCOPY WITH MYOSURE N/A 09/05/2017   Procedure: DILATATION & CURETTAGE/HYSTEROSCOPY WITH MYOSURE;   Surgeon: Anastasio Auerbach, MD;  Location: [place];  Service: Gynecology;  Laterality: N/A;  requests 7:30am OR time Requests one hour  . DILATION AND CURETTAGE OF UTERUS  09/05/2017   Hyperplastic uterus, uterine polyp removal, irregular bleeding  . WISDOM TOOTH EXTRACTION      There were no vitals filed for this visit.  Subjective Assessment - 10/28/17 1105    Subjective  Pain still at night, a little better. Rough night several nights ago at work.                        [facility] Adult PT Treatment/Exercise - 10/28/17 0001      Therapeutic Activites    Therapeutic Activities  Lifting;Work Web designer  hip hinge and knee to waist lifting of stool     Work Simulation  transfers from bed to chair       Lumbar Exercises: Seated   Other Seated Lumbar Exercises  UE flx with horiz abd pull on tband (per HEP) ; horiz abd red tband      Lumbar Exercises: Supine   AB Set Limitations  post pelvic tilt for abdominal engagement    Other Supine Lumbar Exercises  table top holds      Lumbar Exercises: Sidelying   Clam  Both;20 reps    Other Sidelying Lumbar Exercises  upper trunk rotaions       Lumbar Exercises: Prone   Other Prone Lumbar Exercises  small range cat/camel      Lumbar Exercises: Quadruped   Madcat/Old Horse  10 reps    Straight Leg Raise  10 reps    Opposite Arm/Leg Raise  10 reps    Other Quadruped Lumbar Exercises  serratus UE raise in quadruped per HEP                   PT Long Term Goals - 10/08/17 1630      PT LONG TERM GOAL #1   Title  pt will be able to get out of bed without pain    Baseline  pain before getting up in the am    Time  6    Period  Weeks    Status  New    Target Date  11/21/17      PT LONG TERM GOAL #2   Title  bil abduction strength to 5/5 for support to lumbopelvic region    Baseline  3/5 bil    Time  6    Period  Weeks    Status  New    Target Date  08/24/17      PT LONG  TERM GOAL #3   Title  Pt will be able to complete work shifts without rest break required due to pain    Baseline  multiple required at eval    Time  6    Period  Weeks    Status  New    Target Date  11/21/17      PT LONG TERM GOAL #4   Title  FOTO to 25% limited    Baseline  37% limited at eval    Time  6    Period  Weeks    Status  New    Target Date  11/21/17      PT LONG TERM GOAL #5   Title  Pt will be independent in long term HEP for continued strengthening and care    Baseline  will progress as appropriate    Time  6    Period  Weeks    Status  New    Target Date  11/21/17            Plan - 10/28/17 1116    Clinical Impression Statement  Pt reports decreased AM pain. She also  notes less rest breaks required at work due to pain. 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 Time spent reviewing hip hinge and proper lifting of objects and patients. Reviewed and reprinted HEP as her son lost her HEP from last visit. No increased pain at end of session.     PT Next Visit Plan  re check hip hinge/ lifting- objects & patients    PT Home Exercise Plan  qped alt UE/LE lifts, LTR, GHJ flx with horiz abd tband, horiz abd red tband, tabletop holds    Consulted and Agree with Plan of Care  Patient       Patient will benefit from skilled therapeutic intervention in order to improve the following deficits and impairments:  Pain, Improper body mechanics, Increased muscle spasms, Postural dysfunction, Decreased strength, Decreased activity tolerance, Decreased balance, Difficulty walking, Impaired flexibility  Visit Diagnosis: Chronic bilateral low back pain without sciatica  Muscle weakness (generalized)  Problem List Patient Active Problem List   Diagnosis Date Noted  . Low back pain without sciatica 09/23/2017  . Plantar fasciitis of right foot 02/19/2017  . Endometrial thickening on ultrasound  02/19/2017  . Paronychia of finger of right hand 12/29/2016  . Migraine 07/26/2006  . ASTHMA 07/26/2006  . HX, PERSONAL, EXPOSURE TO LEAD 07/26/2006    Dorene Ar, PTA 10/28/2017, 12:03 PM  [place], [place], [address] Phone: [phone]   Fax:  [phone]  Name: Jacqueline Woodard MRN: [id number] Date of Birth: 12-Sep-1985

## 2017-10-30 ENCOUNTER — Telehealth: Payer: Self-pay | Admitting: Physical Therapy

## 2017-10-30 ENCOUNTER — Ambulatory Visit: Payer: BLUE CROSS/BLUE SHIELD | Admitting: Physical Therapy

## 2017-10-30 NOTE — Telephone Encounter (Signed)
Left message regarding no show and advised of next appointment time. Requested call back to reschedule if needed.  Tamy Accardo C. Roy Tokarz PT, DPT 10/30/17 1:09 PM

## 2017-11-04 ENCOUNTER — Encounter: Payer: Self-pay | Admitting: Physical Therapy

## 2017-11-04 ENCOUNTER — Ambulatory Visit: Payer: BLUE CROSS/BLUE SHIELD | Admitting: Physical Therapy

## 2017-11-04 DIAGNOSIS — M6281 Muscle weakness (generalized): Secondary | ICD-10-CM | POA: Diagnosis not present

## 2017-11-04 DIAGNOSIS — G8929 Other chronic pain: Secondary | ICD-10-CM

## 2017-11-04 DIAGNOSIS — M545 Low back pain: Secondary | ICD-10-CM | POA: Diagnosis not present

## 2017-11-04 NOTE — Therapy (Signed)
[place], [place], [address] Phone: [phone]   Fax:  [phone]  Physical Therapy Treatment  Patient Details  Name: Jacqueline Woodard MRN: [id number] Date of Birth: Mar 20, 1985 Referring Provider: Daisy Floro, DO   Encounter Date: 11/04/2017  PT End of Session - 11/04/17 1114    Visit Number  4    Number of Visits  13    Date for PT Re-Evaluation  11/21/17    Authorization Type  BCBS 90 visit limit    PT Start Time  1102    PT Stop Time  1140    PT Time Calculation (min)  38 min       Past Medical History:  Diagnosis Date  . Chronic migraine   . Endometrial polyp   . GERD (gastroesophageal reflux disease)   . Hereditary lymphedema of legs    chronic --- right > left  . History of acute renal failure    in setting cellulitis left lower leg 06/ 2008 and right lower leg cellulitis with sepsis  . History of borderline diabetes mellitus    08-29-2017 PER PT DX 2016 was checked again last year no longer pre-diabetic  . History of cellulitis    06/ 2008  left lower leg cellulitis:  12-05-2013  and 06-04-2014-- right lower leg cellulitis w/ sepsis  . History of Clostridium difficile infection 08/2006  . History of gastric ulcer 2015  . History of ovarian cyst   . History of sepsis    w/ right lower leg cellulitis 12-05-2013 and 06-04-2014  . Microcytic anemia   . Mild asthma    INHALER PRN-- LAST USED 2014  . Uterine fibroid   . Wears glasses     Past Surgical History:  Procedure Laterality Date  . BREAST REDUCTION SURGERY Bilateral 03/01/2013   Procedure: BILATERAL MAMMARY REDUCTION  (BREAST);  Surgeon: Cristine Polio, MD;  Location: [place];  Service: Plastics;  Laterality: Bilateral;  . BREAST SURGERY Bilateral 2015   Breast reduction  . DILATATION & CURETTAGE/HYSTEROSCOPY WITH MYOSURE N/A 09/05/2017   Procedure: DILATATION & CURETTAGE/HYSTEROSCOPY WITH MYOSURE;   Surgeon: Anastasio Auerbach, MD;  Location: [place];  Service: Gynecology;  Laterality: N/A;  requests 7:30am OR time Requests one hour  . DILATION AND CURETTAGE OF UTERUS  09/05/2017   Hyperplastic uterus, uterine polyp removal, irregular bleeding  . WISDOM TOOTH EXTRACTION      There were no vitals filed for this visit.  Subjective Assessment - 11/04/17 1134    Subjective  Pain intensity at work improving. Less rest breaks. Watching my body mechanics.     Currently in Pain?  No/denies         [facility] PT Assessment - 11/04/17 0001      Strength   Right Hip ABduction  4-/5    Left Hip ABduction  4-/5                   [facility] Adult PT Treatment/Exercise - 11/04/17 0001      Lumbar Exercises: Stretches   Prone Mid Back Stretch Limitations  childs pose with laterals x  rounds of 30 sec each       Lumbar Exercises: Seated   Sit to Stand  10 reps    Other Seated Lumbar Exercises  UE flx with horiz abd pull on tband (per HEP) ; horiz abd red tband      Lumbar Exercises: Supine  Bridge  20 reps    Other Supine Lumbar Exercises  table top holds      Lumbar Exercises: Sidelying   Hip Abduction  20 reps    Other Sidelying Lumbar Exercises  upper trunk rotaions       Lumbar Exercises: Quadruped   Madcat/Old Horse  10 reps    Opposite Arm/Leg Raise  10 reps    Opposite Arm/Leg Raise Limitations  cues for neutral and core brace     Other Quadruped Lumbar Exercises  serratus UE raise in quadruped per HEP              PT Education - 11/04/17 1141    Education Details  HEP    Person(s) Educated  Patient    Methods  Explanation;Handout    Comprehension  Verbalized understanding          PT Long Term Goals - 11/04/17 1108      PT LONG TERM GOAL #1   Title  pt will be able to get out of bed without pain    Baseline  Much better, intermittent     Time  6    Period  Weeks    Status  Partially Met      PT LONG TERM GOAL #2   Title   bil abduction strength to 5/5 for support to lumbopelvic region    Baseline  Eval: 3/5 bilateral; 11/04/2017 4/5 bilateral     Time  6    Period  Weeks    Status  On-going      PT LONG TERM GOAL #3   Title  Pt will be able to complete work shifts without rest break required due to pain    Baseline  1-2 rest breaks, pain not as intense, 6/10 at most, was 10/10    Time  6    Period  Weeks    Status  On-going      PT LONG TERM GOAL #4   Title  FOTO to 25% limited    Baseline  37% limited at eval    Time  6    Period  Weeks    Status  On-going      PT LONG TERM GOAL #5   Title  Pt will be independent in long term HEP for continued strengthening and care    Baseline  will progress as appropriate    Time  6    Period  Weeks    Status  On-going            Plan - 11/04/17 1122    Clinical Impression Statement  Pt reports improvement in pain despite difficulty with HEP. She reports bed too soft and floor to hard. Offered suggestions to modify exercises using fists in quadruped. Hip abduction strength improving. Overall pain intensity less at work with less rest breaks required. Morning pain much improved and intermittent. Progressing toward all LTGs.     PT Next Visit Plan  core/hip strength for completion of goals. thoracic mobility , re-enforce body mechanics/ hip hinge.     PT Home Exercise Plan  qped alt UE/LE lifts, LTR, GHJ flx with horiz abd tband, horiz abd red tband, tabletop holds, side hip abduction, sit-stand     Consulted and Agree with Plan of Care  Patient       Patient will benefit from skilled therapeutic intervention in order to improve the following deficits and impairments:  Pain, Improper body mechanics, Increased muscle spasms, Postural dysfunction,  Decreased strength, Decreased activity tolerance, Decreased balance, Difficulty walking, Impaired flexibility  Visit Diagnosis: Chronic bilateral low back pain without sciatica  Muscle weakness  (generalized)     Problem List Patient Active Problem List   Diagnosis Date Noted  . Low back pain without sciatica 09/23/2017  . Plantar fasciitis of right foot 02/19/2017  . Endometrial thickening on ultrasound 02/19/2017  . Paronychia of finger of right hand 12/29/2016  . Migraine 07/26/2006  . ASTHMA 07/26/2006  . HX, PERSONAL, EXPOSURE TO LEAD 07/26/2006    Dorene Ar, PTA 11/04/2017, 11:44 AM  [place], [place], [address] Phone: [phone]   Fax:  [phone]  Name: LIZBETT GARCIAGARCIA MRN: [id number] Date of Birth: 07-02-1985

## 2017-11-04 NOTE — Patient Instructions (Signed)
Abduction: Side Leg Lift (Eccentric) - Side-Lying    Lie on side. Lift top leg slightly higher than shoulder level. Keep top leg straight with body, toes pointing forward. Slowly lower for 3-5 seconds. 20___ reps per set, __2_ sets per day, __7_ days per week.  SIT TO STAND: Feet Narrow    Place feet shoulder width apart.  Lean chest forward. Raise hips and straighten knees to stand. Squeeze gluteal muscles. Slowly lower to edge of seat.  _10-15__ reps per set, _2__ sets per day, __7_ days per week Hold onto a support.

## 2017-11-06 ENCOUNTER — Encounter: Payer: Self-pay | Admitting: Physical Therapy

## 2017-11-06 ENCOUNTER — Ambulatory Visit: Payer: BLUE CROSS/BLUE SHIELD | Admitting: Physical Therapy

## 2017-11-06 DIAGNOSIS — G8929 Other chronic pain: Secondary | ICD-10-CM | POA: Diagnosis not present

## 2017-11-06 DIAGNOSIS — M545 Low back pain, unspecified: Secondary | ICD-10-CM

## 2017-11-06 DIAGNOSIS — M6281 Muscle weakness (generalized): Secondary | ICD-10-CM

## 2017-11-06 NOTE — Therapy (Signed)
[place], [place], [address] Phone: [phone]   Fax:  [phone]  Physical Therapy Treatment  Patient Details  Name: Jacqueline Woodard MRN: [id number] Date of Birth: January 23, 1986 Referring Provider: Daisy Floro, DO   Encounter Date: 11/06/2017  PT End of Session - 11/06/17 1126    Visit Number  5    Number of Visits  13    Date for PT Re-Evaluation  11/21/17    Authorization Type  BC[employer] 90 visit limit    PT Start Time  1122   arrived latel   PT Stop Time  1215    PT Time Calculation (min)  53 min       Past Medical History:  Diagnosis Date  . Chronic migraine   . Endometrial polyp   . GERD (gastroesophageal reflux disease)   . Hereditary lymphedema of legs    chronic --- right > left  . History of acute renal failure    in setting cellulitis left lower leg 06/ 2008 and right lower leg cellulitis with sepsis  . History of borderline diabetes mellitus    08-29-2017 PER PT DX 2016 was checked again last year no longer pre-diabetic  . History of cellulitis    06/ 2008  left lower leg cellulitis:  12-05-2013  and 06-04-2014-- right lower leg cellulitis w/ sepsis  . History of Clostridium difficile infection 08/2006  . History of gastric ulcer 2015  . History of ovarian cyst   . History of sepsis    w/ right lower leg cellulitis 12-05-2013 and 06-04-2014  . Microcytic anemia   . Mild asthma    INHALER PRN-- LAST USED 2014  . Uterine fibroid   . Wears glasses     Past Surgical History:  Procedure Laterality Date  . BREAST REDUCTION SURGERY Bilateral 03/01/2013   Procedure: BILATERAL MAMMARY REDUCTION  (BREAST);  Surgeon: Cristine Polio, MD;  Location: [place];  Service: Plastics;  Laterality: Bilateral;  . BREAST SURGERY Bilateral 2015   Breast reduction  . DILATATION & CURETTAGE/HYSTEROSCOPY WITH MYOSURE N/A 09/05/2017   Procedure: DILATATION &  CURETTAGE/HYSTEROSCOPY WITH MYOSURE;  Surgeon: Anastasio Auerbach, MD;  Location: [place];  Service: Gynecology;  Laterality: N/A;  requests 7:30am OR time Requests one hour  . DILATION AND CURETTAGE OF UTERUS  09/05/2017   Hyperplastic uterus, uterine polyp removal, irregular bleeding  . WISDOM TOOTH EXTRACTION      There were no vitals filed for this visit.  Subjective Assessment - 11/06/17 1125    Subjective  Back did not hurt at work last night. No rest breaks needed due to pain.     Currently in Pain?  No/denies                       [facility] Adult PT Treatment/Exercise - 11/06/17 0001      Lumbar Exercises: Stretches   Active Hamstring Stretch  2 reps;30 seconds    Single Knee to Chest Stretch  Right;Left;2 reps;10 seconds    Piriformis Stretch  2 reps;30 seconds      Lumbar Exercises: Seated   Other Seated Lumbar Exercises  squats narrow and wide x 10 each     Other Seated Lumbar Exercises  UE flx with horiz abd pull on tband (per HEP) ; horiz abd red tband   green band today     Lumbar Exercises: Supine   Clam  20 reps  Clam Limitations  green   unilateral and bilateral   Bridge  20 reps    Bridge with clamshell  10 reps   green, sets   Other Supine Lumbar Exercises  table top holds      Lumbar Exercises: Sidelying   Clam  Both;20 reps   clam   Hip Abduction  20 reps   2#   Other Sidelying Lumbar Exercises  upper trunk rotaions       Modalities   Modalities  Cryotherapy      Cryotherapy   Number Minutes Cryotherapy  15 Minutes    Cryotherapy Location  Hip    Type of Cryotherapy  Ice pack             PT Education - 11/06/17 1202    Education Details  HEP    Person(s) Educated  Patient    Methods  Explanation;Handout    Comprehension  Verbalized understanding          PT Long Term Goals - 11/04/17 1108      PT LONG TERM GOAL #1   Title  pt will be able to get out of bed without pain    Baseline  Much  better, intermittent     Time  6    Period  Weeks    Status  Partially Met      PT LONG TERM GOAL #2   Title  bil abduction strength to 5/5 for support to lumbopelvic region    Baseline  Eval: 3/5 bilateral; 11/04/2017 4/5 bilateral     Time  6    Period  Weeks    Status  On-going      PT LONG TERM GOAL #3   Title  Pt will be able to complete work shifts without rest break required due to pain    Baseline  1-2 rest breaks, pain not as intense, 6/10 at most, was 10/10    Time  6    Period  Weeks    Status  On-going      PT LONG TERM GOAL #4   Title  FOTO to 25% limited    Baseline  37% limited at eval    Time  6    Period  Weeks    Status  On-going      PT LONG TERM GOAL #5   Title  Pt will be independent in long term HEP for continued strengthening and care    Baseline  will progress as appropriate    Time  6    Period  Weeks    Status  On-going            Plan - 11/06/17 1149    Clinical Impression Statement  Pt reports no pain today and worked entire shift last night without increased pain. She reports pain in lateral right hip with hip strengthening exercises. Added hip stretches today to help decrease pain. Progressing toward LTGs. Ice pack trial to right hip.     PT Next Visit Plan  core/hip strength for completion of goals. thoracic mobility , re-enforce body mechanics/ hip hinge; address lateral hip pain.     PT Home Exercise Plan  qped alt UE/LE lifts, LTR, GHJ flx with horiz abd tband, horiz abd red tband, tabletop holds, side hip abduction, sit-stand , piriformis stretch, figure 4     Consulted and Agree with Plan of Care  Patient       Patient will benefit from skilled therapeutic intervention  in order to improve the following deficits and impairments:  Pain, Improper body mechanics, Increased muscle spasms, Postural dysfunction, Decreased strength, Decreased activity tolerance, Decreased balance, Difficulty walking, Impaired flexibility  Visit  Diagnosis: Chronic bilateral low back pain without sciatica  Muscle weakness (generalized)     Problem List Patient Active Problem List   Diagnosis Date Noted  . Low back pain without sciatica 09/23/2017  . Plantar fasciitis of right foot 02/19/2017  . Endometrial thickening on ultrasound 02/19/2017  . Paronychia of finger of right hand 12/29/2016  . Migraine 07/26/2006  . ASTHMA 07/26/2006  . HX, PERSONAL, EXPOSURE TO LEAD 07/26/2006    Dorene Ar, PTA 11/06/2017, 12:13 PM  [facility] [address] [place], [place], [address] Phone: [phone]   Fax:  [phone]  Name: Jacqueline Woodard MRN: [id number] Date of Birth: 1985/04/10

## 2017-11-11 ENCOUNTER — Ambulatory Visit: Payer: BLUE CROSS/BLUE SHIELD | Admitting: Physical Therapy

## 2017-11-11 ENCOUNTER — Encounter: Payer: Self-pay | Admitting: Physical Therapy

## 2017-11-11 DIAGNOSIS — M6281 Muscle weakness (generalized): Secondary | ICD-10-CM | POA: Diagnosis not present

## 2017-11-11 DIAGNOSIS — M545 Low back pain, unspecified: Secondary | ICD-10-CM

## 2017-11-11 DIAGNOSIS — G8929 Other chronic pain: Secondary | ICD-10-CM

## 2017-11-11 NOTE — Therapy (Signed)
[place], [place], [address] Phone: [phone]   Fax:  [phone]  Physical Therapy Treatment  Patient Details  Name: Jacqueline Woodard MRN: [id number] Date of Birth: 1985/12/15 Referring Provider: Daisy Floro, DO   Encounter Date: 11/11/2017  PT End of Session - 11/11/17 1109    Visit Number  6    Number of Visits  13    Date for PT Re-Evaluation  11/21/17    Authorization Type  BCBS 90 visit limit    PT Start Time  1101    PT Stop Time  1145    PT Time Calculation (min)  44 min       Past Medical History:  Diagnosis Date  . Chronic migraine   . Endometrial polyp   . GERD (gastroesophageal reflux disease)   . Hereditary lymphedema of legs    chronic --- right > left  . History of acute renal failure    in setting cellulitis left lower leg 06/ 2008 and right lower leg cellulitis with sepsis  . History of borderline diabetes mellitus    08-29-2017 PER PT DX 2016 was checked again last year no longer pre-diabetic  . History of cellulitis    06/ 2008  left lower leg cellulitis:  12-05-2013  and 06-04-2014-- right lower leg cellulitis w/ sepsis  . History of Clostridium difficile infection 08/2006  . History of gastric ulcer 2015  . History of ovarian cyst   . History of sepsis    w/ right lower leg cellulitis 12-05-2013 and 06-04-2014  . Microcytic anemia   . Mild asthma    INHALER PRN-- LAST USED 2014  . Uterine fibroid   . Wears glasses     Past Surgical History:  Procedure Laterality Date  . BREAST REDUCTION SURGERY Bilateral 03/01/2013   Procedure: BILATERAL MAMMARY REDUCTION  (BREAST);  Surgeon: Cristine Polio, MD;  Location: [place];  Service: Plastics;  Laterality: Bilateral;  . BREAST SURGERY Bilateral 2015   Breast reduction  . DILATATION & CURETTAGE/HYSTEROSCOPY WITH MYOSURE N/A 09/05/2017   Procedure: DILATATION & CURETTAGE/HYSTEROSCOPY WITH MYOSURE;   Surgeon: Anastasio Auerbach, MD;  Location: [place];  Service: Gynecology;  Laterality: N/A;  requests 7:30am OR time Requests one hour  . DILATION AND CURETTAGE OF UTERUS  09/05/2017   Hyperplastic uterus, uterine polyp removal, irregular bleeding  . WISDOM TOOTH EXTRACTION      There were no vitals filed for this visit.  Subjective Assessment - 11/11/17 1108    Subjective  I over did it last week with dancing and walking at a music festival. Pain up to 4/10, relieved by rest.     Currently in Pain?  No/denies                       [facility] Adult PT Treatment/Exercise - 11/11/17 0001      Lumbar Exercises: Stretches   Single Knee to Chest Stretch  Right;Left;2 reps;10 seconds    Piriformis Stretch  2 reps;30 seconds      Lumbar Exercises: Aerobic   Nustep  L5 UE/LE x 6 minutes       Lumbar Exercises: Seated   Other Seated Lumbar Exercises  squats at counter x 20     Other Seated Lumbar Exercises  UE flx with horiz abd pull on tband (per HEP) ; horiz abd red tband   green band today  Lumbar Exercises: Supine   Dead Bug  10 reps    Bridge  20 reps    Bridge with [employer] Limitations  bridge with marching     Other Supine Lumbar Exercises  table top holds      Lumbar Exercises: Sidelying   Clam  Both;20 reps   green   Hip Abduction  20 reps   2#   Other Sidelying Lumbar Exercises  upper trunk rotaions       Lumbar Exercises: Quadruped   Madcat/Old Horse  10 reps    Opposite Arm/Leg Raise  10 reps    Opposite Arm/Leg Raise Limitations  cues for neutral and core brace     Other Quadruped Lumbar Exercises  serratus UE raise in quadruped per HEP    2#                 PT Long Term Goals - 11/04/17 1108      PT LONG TERM GOAL #1   Title  pt will be able to get out of bed without pain    Baseline  Much better, intermittent     Time  6    Period  Weeks    Status  Partially Met      PT LONG TERM GOAL #2   Title  bil  abduction strength to 5/5 for support to lumbopelvic region    Baseline  Eval: 3/5 bilateral; 11/04/2017 4/5 bilateral     Time  6    Period  Weeks    Status  On-going      PT LONG TERM GOAL #3   Title  Pt will be able to complete work shifts without rest break required due to pain    Baseline  1-2 rest breaks, pain not as intense, 6/10 at most, was 10/10    Time  6    Period  Weeks    Status  On-going      PT LONG TERM GOAL #4   Title  FOTO to 25% limited    Baseline  37% limited at eval    Time  6    Period  Weeks    Status  On-going      PT LONG TERM GOAL #5   Title  Pt will be independent in long term HEP for continued strengthening and care    Baseline  will progress as appropriate    Time  6    Period  Weeks    Status  On-going            Plan - 11/11/17 1140    Clinical Impression Statement  Pt reports 3 days on trip to music festival and did not have pain until after the 3rd day. She tolerated dancing, walking and sleeping on the floor. Pain increased to 4/10 however she reports this was manageable and subsided after resting for 1 day. She has not returned to work this week. She starts back tonight. Continued with strengthening and trunk ROM as tolerated. She reports no right alteral hip pain since last visit, ice helped alot.     PT Next Visit Plan  core/hip strength for completion of goals. thoracic mobility , re-enforce body mechanics/ hip hinge; address lateral hip pain prn.     PT Home Exercise Plan  qped alt UE/LE lifts, LTR, GHJ flx with horiz abd tband, horiz abd red tband, tabletop holds, side hip abduction, sit-stand , piriformis stretch, figure 4     Consulted and  Agree with Plan of Care  Patient       Patient will benefit from skilled therapeutic intervention in order to improve the following deficits and impairments:  Pain, Improper body mechanics, Increased muscle spasms, Postural dysfunction, Decreased strength, Decreased activity tolerance, Decreased  balance, Difficulty walking, Impaired flexibility  Visit Diagnosis: Chronic bilateral low back pain without sciatica  Muscle weakness (generalized)     Problem List Patient Active Problem List   Diagnosis Date Noted  . Low back pain without sciatica 09/23/2017  . Plantar fasciitis of right foot 02/19/2017  . Endometrial thickening on ultrasound 02/19/2017  . Paronychia of finger of right hand 12/29/2016  . Migraine 07/26/2006  . ASTHMA 07/26/2006  . HX, PERSONAL, EXPOSURE TO LEAD 07/26/2006    Dorene Ar, PTA 11/11/2017, 11:43 AM  [place], [place], [address] Phone: [phone]   Fax:  [phone]  Name: Jacqueline Woodard MRN: [id number] Date of Birth: May 19, 1985

## 2017-11-13 ENCOUNTER — Ambulatory Visit: Payer: BLUE CROSS/BLUE SHIELD | Admitting: Physical Therapy

## 2017-11-13 ENCOUNTER — Encounter: Payer: Self-pay | Admitting: Physical Therapy

## 2017-11-13 DIAGNOSIS — M6281 Muscle weakness (generalized): Secondary | ICD-10-CM

## 2017-11-13 DIAGNOSIS — G8929 Other chronic pain: Secondary | ICD-10-CM | POA: Diagnosis not present

## 2017-11-13 DIAGNOSIS — M545 Low back pain: Principal | ICD-10-CM

## 2017-11-13 NOTE — Therapy (Signed)
[facility] Outpatient Rehabilitation [facility] [address] [place], [place], [address] Phone: [phone]   Fax:  [phone]  Physical Therapy Treatment  Patient Details  Name: Jacqueline Woodard MRN: [id number] Date of Birth: 1985/07/31 Referring Provider: Dollene Cleveland, DO   Encounter Date: 11/13/2017  PT End of Session - 11/13/17 1108    Visit Number  7    Number of Visits  13    Date for PT Re-Evaluation  11/21/17    Authorization Type  BCBS 90 visit limit    PT Start Time  1107    PT Stop Time  1142    PT Time Calculation (min)  35 min    Activity Tolerance  Patient tolerated treatment well    Behavior During Therapy  [facility] for tasks assessed/performed       Past Medical History:  Diagnosis Date  . Chronic migraine   . Endometrial polyp   . GERD (gastroesophageal reflux disease)   . Hereditary lymphedema of legs    chronic --- right > left  . History of acute renal failure    in setting cellulitis left lower leg 06/ 2008 and right lower leg cellulitis with sepsis  . History of borderline diabetes mellitus    08-29-2017 PER PT DX 2016 was checked again last year no longer pre-diabetic  . History of cellulitis    06/ 2008  left lower leg cellulitis:  12-05-2013  and 06-04-2014-- right lower leg cellulitis w/ sepsis  . History of Clostridium difficile infection 08/2006  . History of gastric ulcer 2015  . History of ovarian cyst   . History of sepsis    w/ right lower leg cellulitis 12-05-2013 and 06-04-2014  . Microcytic anemia   . Mild asthma    INHALER PRN-- LAST USED 2014  . Uterine fibroid   . Wears glasses     Past Surgical History:  Procedure Laterality Date  . BREAST REDUCTION SURGERY Bilateral 03/01/2013   Procedure: BILATERAL MAMMARY REDUCTION  (BREAST);  Surgeon: Louisa Second, MD;  Location: [facility];  Service: Plastics;  Laterality: Bilateral;  . BREAST SURGERY Bilateral 2015   Breast reduction  .  DILATATION & CURETTAGE/HYSTEROSCOPY WITH MYOSURE N/A 09/05/2017   Procedure: DILATATION & CURETTAGE/HYSTEROSCOPY WITH MYOSURE;  Surgeon: Dara Lords, MD;  Location: [facility];  Service: Gynecology;  Laterality: N/A;  requests 7:30am OR time Requests one hour  . DILATION AND CURETTAGE OF UTERUS  09/05/2017   Hyperplastic uterus, uterine polyp removal, irregular bleeding  . WISDOM TOOTH EXTRACTION      There were no vitals filed for this visit.  Subjective Assessment - 11/13/17 1108    Subjective  Not completely gone but pain is much better. able to wake up without pain. More pain in right hip, after about 5 reps of exercises feels pain. Able to sit and recover faster.     Patient Stated Goals  decrease pain, work, care for 32 year old    Currently in Pain?  No/denies                       [facility] Adult PT Treatment/Exercise - 11/13/17 0001      Exercises   Exercises  Lumbar      Lumbar Exercises: Stretches   Passive Hamstring Stretch  Right;2 reps;30 seconds;Left    Piriformis Stretch  2 reps;30 seconds      Lumbar Exercises: Standing   Functional Squats Limitations  to tap table, glut sets to stand    Other Standing Lumbar Exercises  hip hinge with squat, reaching for floor      Lumbar Exercises: Sidelying   Clam  Right;20 reps   x2 sets     Lumbar Exercises: Prone   Other Prone Lumbar Exercises  hip ext with knee flexed x20      Manual Therapy   Manual therapy comments  skilled palpation and monitoring during TPDN''    Joint Mobilization  piriformis, TFL, glut med/min       Trigger Point Dry Needling - 11/13/17 1124    Consent Given?  Yes    Education Handout Provided  --   verbal education   Muscles Treated Lower Body  Gluteus minimus;Piriformis;Tensor fascia lata    Gluteus Minimus Response  Twitch response elicited;Palpable increased muscle length    Piriformis Response  Twitch response elicited;Palpable increased muscle length     Tensor Fascia Lata Response  Twitch response elicited;Palpable increased muscle length           PT Education - 11/13/17 1440    Education Details  TPDN & expected outcomes, POC, exercise form/rationale    Person(s) Educated  Patient    Methods  Explanation;Demonstration;Verbal cues    Comprehension  Verbalized understanding;Returned demonstration;Verbal cues required;Need further instruction          PT Long Term Goals - 11/04/17 1108      PT LONG TERM GOAL #1   Title  pt will be able to get out of bed without pain    Baseline  Much better, intermittent     Time  6    Period  Weeks    Status  Partially Met      PT LONG TERM GOAL #2   Title  bil abduction strength to 5/5 for support to lumbopelvic region    Baseline  Eval: 3/5 bilateral; 11/04/2017 4/5 bilateral     Time  6    Period  Weeks    Status  On-going      PT LONG TERM GOAL #3   Title  Pt will be able to complete work shifts without rest break required due to pain    Baseline  1-2 rest breaks, pain not as intense, 6/10 at most, was 10/10    Time  6    Period  Weeks    Status  On-going      PT LONG TERM GOAL #4   Title  FOTO to 25% limited    Baseline  37% limited at eval    Time  6    Period  Weeks    Status  On-going      PT LONG TERM GOAL #5   Title  Pt will be independent in long term HEP for continued strengthening and care    Baseline  will progress as appropriate    Time  6    Period  Weeks    Status  On-going            Plan - 11/13/17 1143    Clinical Impression Statement  Pt was able to perform clams after DN that felt "a lot different" and was able to perform repetitions appropriately. cues for hip hinge + squatting to grab objects from floor. nearing end of POC and asked her to be aware of what is still limitng to her.     PT Treatment/Interventions  ADLs/Self Care Home Management;Cryotherapy;Electrical Stimulation;Ultrasound;Traction;Moist Heat;Iontophoresis 4mg /ml  Dexamethasone;Functional mobility training;Therapeutic  activities;Therapeutic exercise;Balance training;Patient/family education;Neuromuscular re-education;Manual techniques;Dry needling;Passive range of motion;Taping    PT Next Visit Plan  DN outcomes? increase core challenges in HEP    PT Home Exercise Plan  qped alt UE/LE lifts, LTR, GHJ flx with horiz abd tband, horiz abd red tband, tabletop holds, side hip abduction, sit-stand , piriformis stretch, figure 4     Consulted and Agree with Plan of Care  Patient       Patient will benefit from skilled therapeutic intervention in order to improve the following deficits and impairments:  Pain, Improper body mechanics, Increased muscle spasms, Postural dysfunction, Decreased strength, Decreased activity tolerance, Decreased balance, Difficulty walking, Impaired flexibility  Visit Diagnosis: Chronic bilateral low back pain without sciatica  Muscle weakness (generalized)     Problem List Patient Active Problem List   Diagnosis Date Noted  . Low back pain without sciatica 09/23/2017  . Plantar fasciitis of right foot 02/19/2017  . Endometrial thickening on ultrasound 02/19/2017  . Paronychia of finger of right hand 12/29/2016  . Migraine 07/26/2006  . ASTHMA 07/26/2006  . HX, PERSONAL, EXPOSURE TO LEAD 07/26/2006    Melita Villalona C. Fannie Alomar PT, DPT 11/13/17 2:43 PM   [facility] [address] [place], [place], [address] Phone: [phone]   Fax:  [phone]  Name: AIVA WALLENSTEIN MRN: [id number] Date of Birth: Feb 16, 1986

## 2017-11-18 ENCOUNTER — Ambulatory Visit: Payer: BLUE CROSS/BLUE SHIELD | Attending: Family Medicine | Admitting: Physical Therapy

## 2017-11-18 ENCOUNTER — Encounter: Payer: Self-pay | Admitting: Physical Therapy

## 2017-11-18 DIAGNOSIS — M6281 Muscle weakness (generalized): Secondary | ICD-10-CM | POA: Diagnosis not present

## 2017-11-18 DIAGNOSIS — G8929 Other chronic pain: Secondary | ICD-10-CM | POA: Insufficient documentation

## 2017-11-18 DIAGNOSIS — M545 Low back pain: Secondary | ICD-10-CM | POA: Diagnosis not present

## 2017-11-18 NOTE — Therapy (Addendum)
[facility] [address] [place], [place], [address] Phone: [phone]   Fax:  [phone]  Physical Therapy Treatment/Discharge  Patient Details  Name: Jacqueline Woodard MRN: [id number] Date of Birth: May 12, 1985 Referring Provider: Dollene Cleveland, DO   Encounter Date: 11/18/2017  PT End of Session - 11/18/17 1115    Visit Number  8    Number of Visits  13    Date for PT Re-Evaluation  11/21/17    Authorization Type  BCBS 90 visit limit    PT Start Time  1108    PT Stop Time  1140    PT Time Calculation (min)  32 min       Past Medical History:  Diagnosis Date  . Chronic migraine   . Endometrial polyp   . GERD (gastroesophageal reflux disease)   . Hereditary lymphedema of legs    chronic --- right > left  . History of acute renal failure    in setting cellulitis left lower leg 06/ 2008 and right lower leg cellulitis with sepsis  . History of borderline diabetes mellitus    08-29-2017 PER PT DX 2016 was checked again last year no longer pre-diabetic  . History of cellulitis    06/ 2008  left lower leg cellulitis:  12-05-2013  and 06-04-2014-- right lower leg cellulitis w/ sepsis  . History of Clostridium difficile infection 08/2006  . History of gastric ulcer 2015  . History of ovarian cyst   . History of sepsis    w/ right lower leg cellulitis 12-05-2013 and 06-04-2014  . Microcytic anemia   . Mild asthma    INHALER PRN-- LAST USED 2014  . Uterine fibroid   . Wears glasses     Past Surgical History:  Procedure Laterality Date  . BREAST REDUCTION SURGERY Bilateral 03/01/2013   Procedure: BILATERAL MAMMARY REDUCTION  (BREAST);  Surgeon: Louisa Second, MD;  Location: [facility];  Service: Plastics;  Laterality: Bilateral;  . BREAST SURGERY Bilateral 2015   Breast reduction  . DILATATION & CURETTAGE/HYSTEROSCOPY WITH MYOSURE N/A 09/05/2017   Procedure: DILATATION & CURETTAGE/HYSTEROSCOPY WITH  MYOSURE;  Surgeon: Dara Lords, MD;  Location: [facility];  Service: Gynecology;  Laterality: N/A;  requests 7:30am OR time Requests one hour  . DILATION AND CURETTAGE OF UTERUS  09/05/2017   Hyperplastic uterus, uterine polyp removal, irregular bleeding  . WISDOM TOOTH EXTRACTION      There were no vitals filed for this visit.  Subjective Assessment - 11/18/17 1111    Subjective  Back is fine but the hip still bothers me. The hip is bothering me at work. It is not sharp but feels like bones are grinding and intense aching. It can be hard to walk or move the leg when it gets like that. Pain increases after prolonged actvity on feet.     Currently in Pain?  No/denies    Pain Score  2     Pain Location  Hip    Pain Orientation  Right;Lateral    Pain Descriptors / Indicators  Aching    Aggravating Factors   walking, activity on feet     Pain Relieving Factors  ice, rest     Multiple Pain Sites  Yes                       [facility] Adult PT Treatment/Exercise - 11/18/17 0001      Exercises  Exercises  Lumbar      Lumbar Exercises: Stretches   Single Knee to Chest Stretch  Right;Left;2 reps;10 seconds    Piriformis Stretch  2 reps;30 seconds   and figure 4 bilateral     Lumbar Exercises: Supine   Bridge  20 reps      Lumbar Exercises: Sidelying   Clam  Right;20 reps   x2 sets   Hip Abduction  20 reps    Hip Abduction Limitations  hpi abduction circles       Modalities   Modalities  Iontophoresis      Iontophoresis   Type of Iontophoresis  Dexamethasone    Location  Right hip    Dose  1 ml    Time  6 hour patch              PT Education - 11/18/17 1151    Education Details  IONTO eduction: wear time, precautions     Person(s) Educated  Patient    Methods  Explanation;Handout    Comprehension  Verbalized understanding          PT Long Term Goals - 11/18/17 1118      PT LONG TERM GOAL #1   Title  pt will be able to get  out of bed without pain    Baseline  Much better, intermittent     Time  6    Period  Weeks    Status  Partially Met      PT LONG TERM GOAL #2   Title  bil abduction strength to 5/5 for support to lumbopelvic region    Baseline  improving     Time  6    Period  Weeks    Status  On-going      PT LONG TERM GOAL #3   Title  Pt will be able to complete work shifts without rest break required due to pain    Baseline  no back pain, hip pain up to 5/10    Time  6    Period  Weeks    Status  On-going      PT LONG TERM GOAL #4   Title  FOTO to 25% limited    Baseline  33% limited improved from 37% limited     Time  6    Period  Weeks      PT LONG TERM GOAL #5   Title  Pt will be independent in long term HEP for continued strengthening and care    Baseline  will progress as appropriate    Time  6    Status  On-going            Plan - 11/18/17 1131    Clinical Impression Statement  Pt reports pain intensity decreased after TPDN from up to 7/10 to only up to 5/10. She did not perform HEP this weekend. FOTO score improved. SHe reports back and left hip pain mostly resolved however right hip continues to be painful with prolonged activity on her feet.     PT Next Visit Plan  assess ionto? DN outcomes? increase core challenges in HEP    PT Home Exercise Plan  qped alt UE/LE lifts, LTR, GHJ flx with horiz abd tband, horiz abd red tband, tabletop holds, side hip abduction, sit-stand , piriformis stretch, figure 4     Consulted and Agree with Plan of Care  Patient       Patient will benefit from skilled therapeutic intervention in  order to improve the following deficits and impairments:  Pain, Improper body mechanics, Increased muscle spasms, Postural dysfunction, Decreased strength, Decreased activity tolerance, Decreased balance, Difficulty walking, Impaired flexibility  Visit Diagnosis: Chronic bilateral low back pain without sciatica  Muscle weakness  (generalized)     Problem List Patient Active Problem List   Diagnosis Date Noted  . Low back pain without sciatica 09/23/2017  . Plantar fasciitis of right foot 02/19/2017  . Endometrial thickening on ultrasound 02/19/2017  . Paronychia of finger of right hand 12/29/2016  . Migraine 07/26/2006  . ASTHMA 07/26/2006  . HX, PERSONAL, EXPOSURE TO LEAD 07/26/2006    Sherrie Mustache, PTA 11/18/2017, 12:34 PM  [facility] [address] [place], [place], [address] Phone: [phone]   Fax:  [phone]  Name: Jacqueline Woodard MRN: [id number] Date of Birth: 04-Apr-1985  PHYSICAL THERAPY DISCHARGE SUMMARY  Visits from Start of Care: 8  Current functional level related to goals / functional outcomes: See above   Remaining deficits: See above   Education / Equipment: Anatomy of condition, POC, HEP, exercise form/rationale  Plan: Patient agrees to discharge.  Patient goals were not met. Patient is being discharged due to not returning since the last visit.  ?????    Discharged per attendance policy. Aquil Duhe C. Hightower PT, DPT 12/02/17 4:36 PM

## 2017-11-18 NOTE — Patient Instructions (Signed)

## 2017-11-20 ENCOUNTER — Ambulatory Visit: Payer: BLUE CROSS/BLUE SHIELD | Admitting: Physical Therapy

## 2017-11-25 ENCOUNTER — Ambulatory Visit: Payer: BLUE CROSS/BLUE SHIELD | Admitting: Physical Therapy

## 2017-11-27 ENCOUNTER — Ambulatory Visit: Payer: BLUE CROSS/BLUE SHIELD | Admitting: Physical Therapy

## 2017-12-02 ENCOUNTER — Ambulatory Visit: Payer: BLUE CROSS/BLUE SHIELD | Admitting: Physical Therapy

## 2017-12-02 ENCOUNTER — Telehealth: Payer: Self-pay | Admitting: Physical Therapy

## 2017-12-02 NOTE — Telephone Encounter (Addendum)
Left message on 11/27/2017  regarding no-show to last two appointments. Asked pt to call and reschedule if she cannot attend future appointments or wants to discharge from PT.

## 2017-12-04 ENCOUNTER — Ambulatory Visit: Payer: BLUE CROSS/BLUE SHIELD | Admitting: Physical Therapy

## 2018-01-28 ENCOUNTER — Ambulatory Visit (INDEPENDENT_AMBULATORY_CARE_PROVIDER_SITE_OTHER): Payer: BLUE CROSS/BLUE SHIELD | Admitting: Family Medicine

## 2018-01-28 ENCOUNTER — Encounter: Payer: Self-pay | Admitting: Family Medicine

## 2018-01-28 VITALS — BP 123/80 | HR 95 | Temp 98.3°F | Resp 12 | Wt 217.8 lb

## 2018-01-28 DIAGNOSIS — J4520 Mild intermittent asthma, uncomplicated: Secondary | ICD-10-CM | POA: Insufficient documentation

## 2018-01-28 DIAGNOSIS — J4521 Mild intermittent asthma with (acute) exacerbation: Secondary | ICD-10-CM

## 2018-01-28 DIAGNOSIS — Z23 Encounter for immunization: Secondary | ICD-10-CM

## 2018-01-28 MED ORDER — FLUTICASONE PROPIONATE HFA 110 MCG/ACT IN AERO: 1.0000 | INHALATION_SPRAY | Freq: Two times a day (BID) | RESPIRATORY_TRACT | 12 refills | Status: DC

## 2018-01-28 MED ORDER — ALBUTEROL SULFATE HFA 108 (90 BASE) MCG/ACT IN AERS: INHALATION_SPRAY | RESPIRATORY_TRACT | 2 refills | Status: DC

## 2018-01-28 NOTE — Assessment & Plan Note (Addendum)
Patient improved from this weekend and is afebrile and well appearing with O2 sat of 97%, normal WOB in office, able to speak in full sentences and CTAB without wheezing and good air exchange. Patient to go home on scheduled albuterol- given refill and then will start controller.  - Scheduled albuterol 4 puffs every 4-6 hours for the next 48 hours. After,  transition to 2 puffs every 4 hours as needed.  - Start flovent 2 times daily - Given strict return precautions including worsening SOB, wheezing or increased WOB - Flu shot given today

## 2018-01-28 NOTE — Progress Notes (Signed)
Subjective:    Patient ID: Jacqueline Woodard, female    DOB: [birth year], 32 y.o.   MRN: [id number]   CC: asthma exacerbation  HPI:  Asthma Exacerbation Patient reports that starting Friday she had increased cough and SOB requiring increased use of albuterol inhaler. Patient has never been hospitalized for asthma and has never been intubated. She reports that last use of albuterol was 1 year ago and she is not on a controller medications. She reports that she has gotten better over the weekend and today is better, as she is now able to speak in full sentences.  Denies fevers, chills, runny nose, congestion  Smoking status reviewed  ROS: 10 point ROS is otherwise negative, except as mentioned in HPI  Patient Active Problem List   Diagnosis Date Noted  . Mild intermittent asthma with acute exacerbation 01/28/2018  . Low back pain without sciatica 09/23/2017  . Plantar fasciitis of right foot 02/19/2017  . Endometrial thickening on ultrasound 02/19/2017  . Paronychia of finger of right hand 12/29/2016  . Migraine 07/26/2006  . ASTHMA 07/26/2006  . HX, PERSONAL, EXPOSURE TO LEAD 07/26/2006     Objective:  BP 123/80   Pulse 95   Temp 98.3 F (36.8 C)   Resp 12   Wt 217 lb 12.8 oz (98.8 kg)   SpO2 97%   BMI 40.82 kg/m  Vitals and nursing note reviewed  General: NAD, pleasant Cardiac: RRR, normal heart sounds, no murmurs Respiratory: CTAB, normal effort, able to talk in full sentences Extremities: no edema or cyanosis. WWP. Skin: warm and dry, no rashes noted Neuro: alert and oriented, no focal deficits Psych: normal affect  Assessment & Plan:    Mild intermittent asthma with acute exacerbation Patient improved from this weekend and is afebrile and well appearing with O2 sat of 97%, normal WOB in office, able to speak in full sentences and CTAB without wheezing and good air exchange. Patient to go home on scheduled albuterol- given refill and then will start controller.    - Scheduled albuterol 4 puffs every 4-6 hours for the next 48 hours. After,  transition to 2 puffs every 4 hours as needed.  - Start flovent 2 times daily - Given strict return precautions including worsening SOB, wheezing or increased WOB - Flu shot given today    [place]ata V[place], DO Family Medicine Resident PGY-2

## 2018-01-28 NOTE — Patient Instructions (Signed)
Thank you for coming to see me today. It was a pleasure! Today we talked about:   You are having an acute asthma exacerbation (an asthma attack).   Please continue to give albuterol 4 puffs every 4 hours for the next 48 hours, every 2 hours as needed. After that, you may transition to 2 puffs every 4 hours as needed.   Please return to the ED if you develops increased work of breathing (belly breathing, can see ribs, cannot speak in full sentences) and wheezing that does not improve with 4 puffs of albuterol every 4-6 hours.   Please start using Flovent 1 puff twice daily, everyday and rinse your mouth out with water after using whether you are sick or well.   Please follow-up as needed.  If you have any questions or concerns, please do not hesitate to call the office at [phone].  Take Care,   [place] Pearlee Arvizu, DO

## 2018-04-08 ENCOUNTER — Ambulatory Visit (INDEPENDENT_AMBULATORY_CARE_PROVIDER_SITE_OTHER): Payer: BLUE CROSS/BLUE SHIELD | Admitting: Gynecology

## 2018-04-08 ENCOUNTER — Encounter: Payer: Self-pay | Admitting: Gynecology

## 2018-04-08 VITALS — BP 120/82 | Ht 61.0 in | Wt 211.0 lb

## 2018-04-08 DIAGNOSIS — N920 Excessive and frequent menstruation with regular cycle: Secondary | ICD-10-CM

## 2018-04-08 DIAGNOSIS — Z01419 Encounter for gynecological examination (general) (routine) without abnormal findings: Secondary | ICD-10-CM | POA: Diagnosis not present

## 2018-04-08 NOTE — Patient Instructions (Signed)
Follow-up with your decision as far as menses management to include low-dose oral contraceptive or Mirena IUD trial

## 2018-04-08 NOTE — Progress Notes (Signed)
    Jacqueline Woodard Feb 01, 1986 [id number]        32 y.o.  G0P0000 for annual gynecologic exam.  Had hysteroscopic resection of endometrial polyps earlier this past year.  Notes her menses are lighter but still fairly heavy.  Occur monthly without significant intermenstrual bleeding.  Past medical history,surgical history, problem list, medications, allergies, family history and social history were all reviewed and documented as reviewed in the EPIC chart.  ROS:  Performed with pertinent positives and negatives included in the history, assessment and plan.   Additional significant findings : None   Exam: [name] assistant Vitals:   04/08/18 1609  BP: 120/82  Weight: 211 lb (95.7 kg)  Height: 5\' 1"  (1.549 m)   Body mass index is 39.87 kg/m.  General appearance:  Normal affect, orientation and appearance. Skin: Grossly normal HEENT: Without gross lesions.  No cervical or supraclavicular adenopathy. Thyroid normal.  Lungs:  Clear without wheezing, rales or rhonchi Cardiac: RR, without RMG Abdominal:  Soft, nontender, without masses, guarding, rebound, organomegaly or hernia Breasts:  Examined lying and sitting without masses, retractions, discharge or axillary adenopathy.  Bilateral reduction scars noted Pelvic:  Ext, BUS, Vagina: Normal  Cervix: Normal  Uterus: Anteverted, normal size, shape and contour, midline and mobile nontender   Adnexa: Without masses or tenderness    Anus and perineum: Normal   Rectovaginal: Normal sphincter tone without palpated masses or tenderness.    Assessment/Plan:  33 y.o. G0P0000 female for annual gynecologic exam.  1. Heavy menses.  Hysteroscopy D&C with resection of endometrial polyps earlier this year.  Discussed options to include observation, trial of low-dose oral contraceptives with every other to third month withdrawal or Mirena IUD.  The pros and cons of each choice discussed.  Risks reviewed to include thrombosis as well as  insertional risks.  Patient wants to think of her options and will call with her decision. 2. Contraception not an issue at this point. 3. No history of breast cancer in the family.  Breast exam normal today.  SBE monthly reviewed.  Plan baseline mammography at age 64. 16. Pap smear/HPV 01/2017.  No Pap smear done today.  No history of abnormal Pap smears.  Plan repeat Pap smear at 5-year interval per current screening guidelines. 5. Health maintenance.  No routine lab work done as patient reports this done elsewhere.  Follow-up in 1 year, sooner as needed.   Anastasio Auerbach MD, 4:59 PM 04/08/2018

## 2018-05-16 DIAGNOSIS — Z13220 Encounter for screening for lipoid disorders: Secondary | ICD-10-CM | POA: Diagnosis not present

## 2018-05-16 DIAGNOSIS — Z136 Encounter for screening for cardiovascular disorders: Secondary | ICD-10-CM | POA: Diagnosis not present

## 2018-05-16 DIAGNOSIS — Z713 Dietary counseling and surveillance: Secondary | ICD-10-CM | POA: Diagnosis not present

## 2018-07-22 IMAGING — DX DG HAND COMPLETE 3+V*R*
3 series · 3 of 3 positions shown · non-contrast
Comparison: None

CLINICAL DATA: Swelling and pain for 4 days.  No trauma history.

EXAM:
RIGHT HAND - COMPLETE 3+ VIEW

[hand pa]
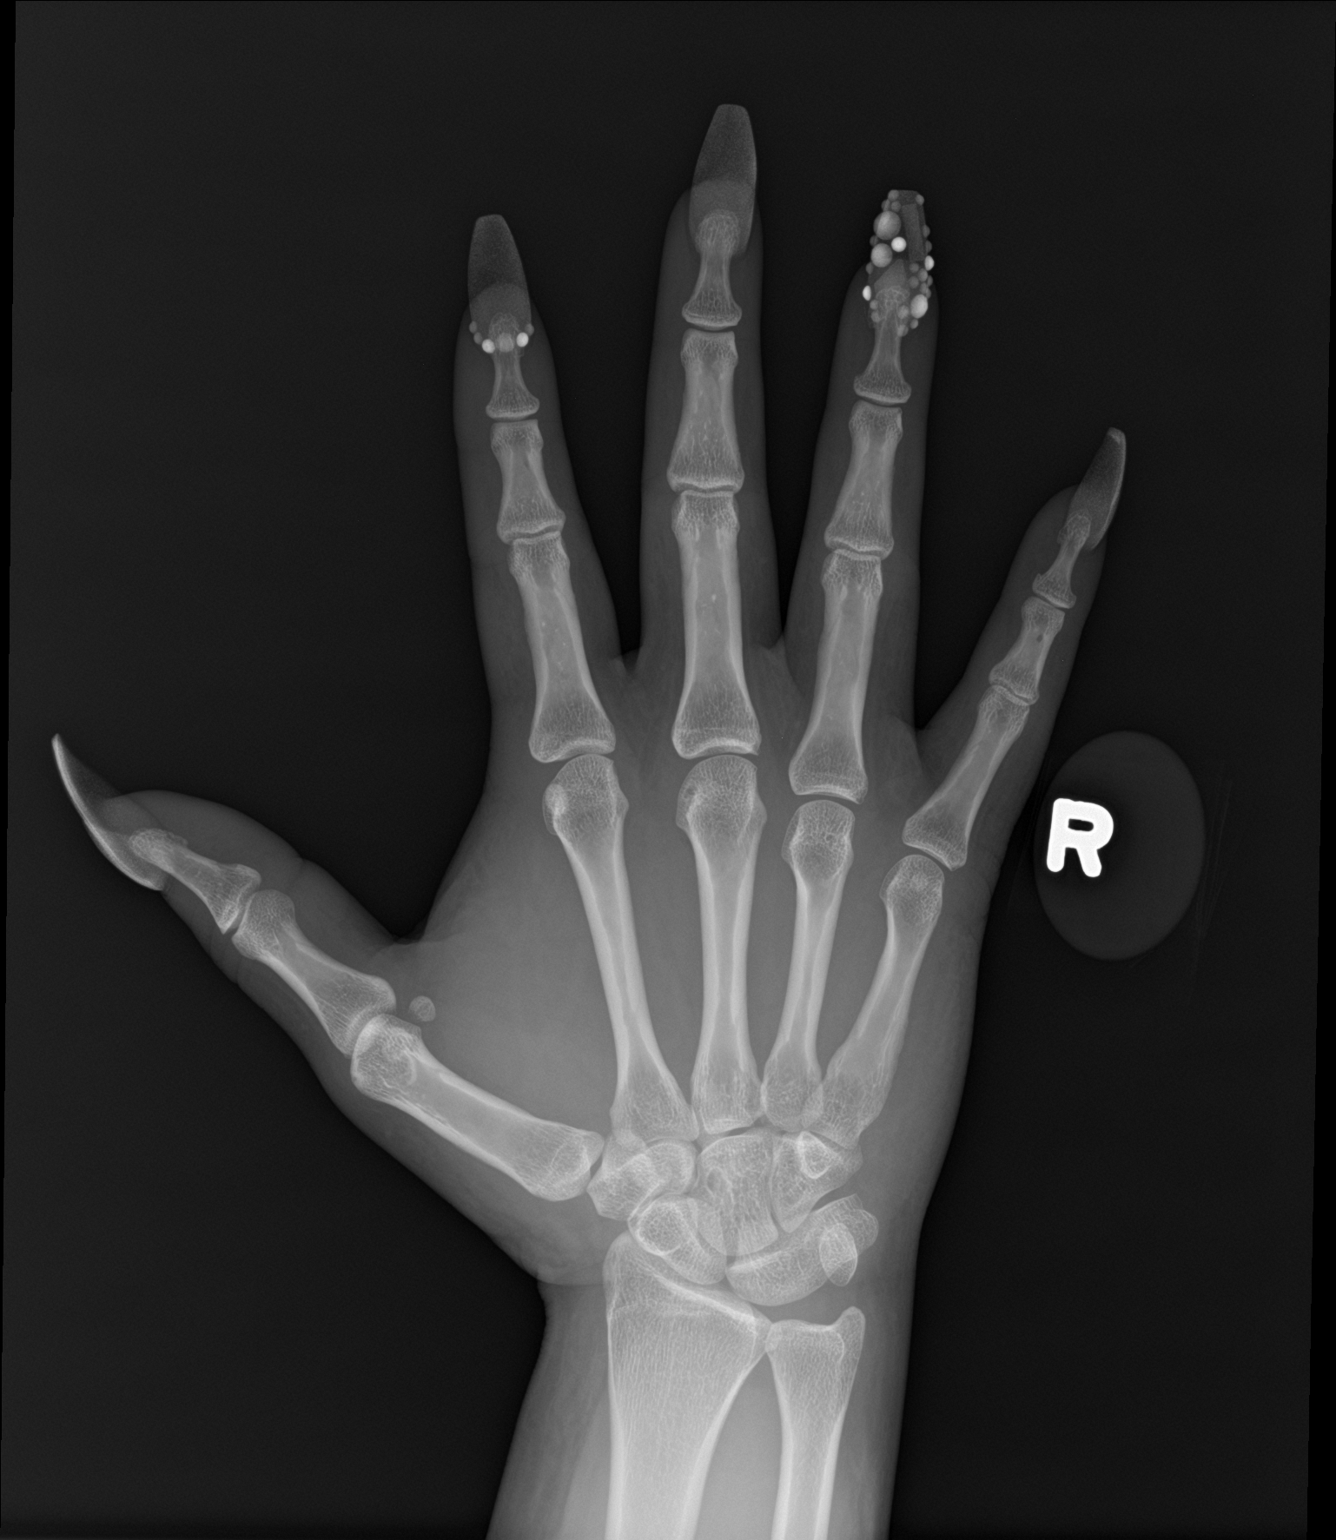

[hand obl]
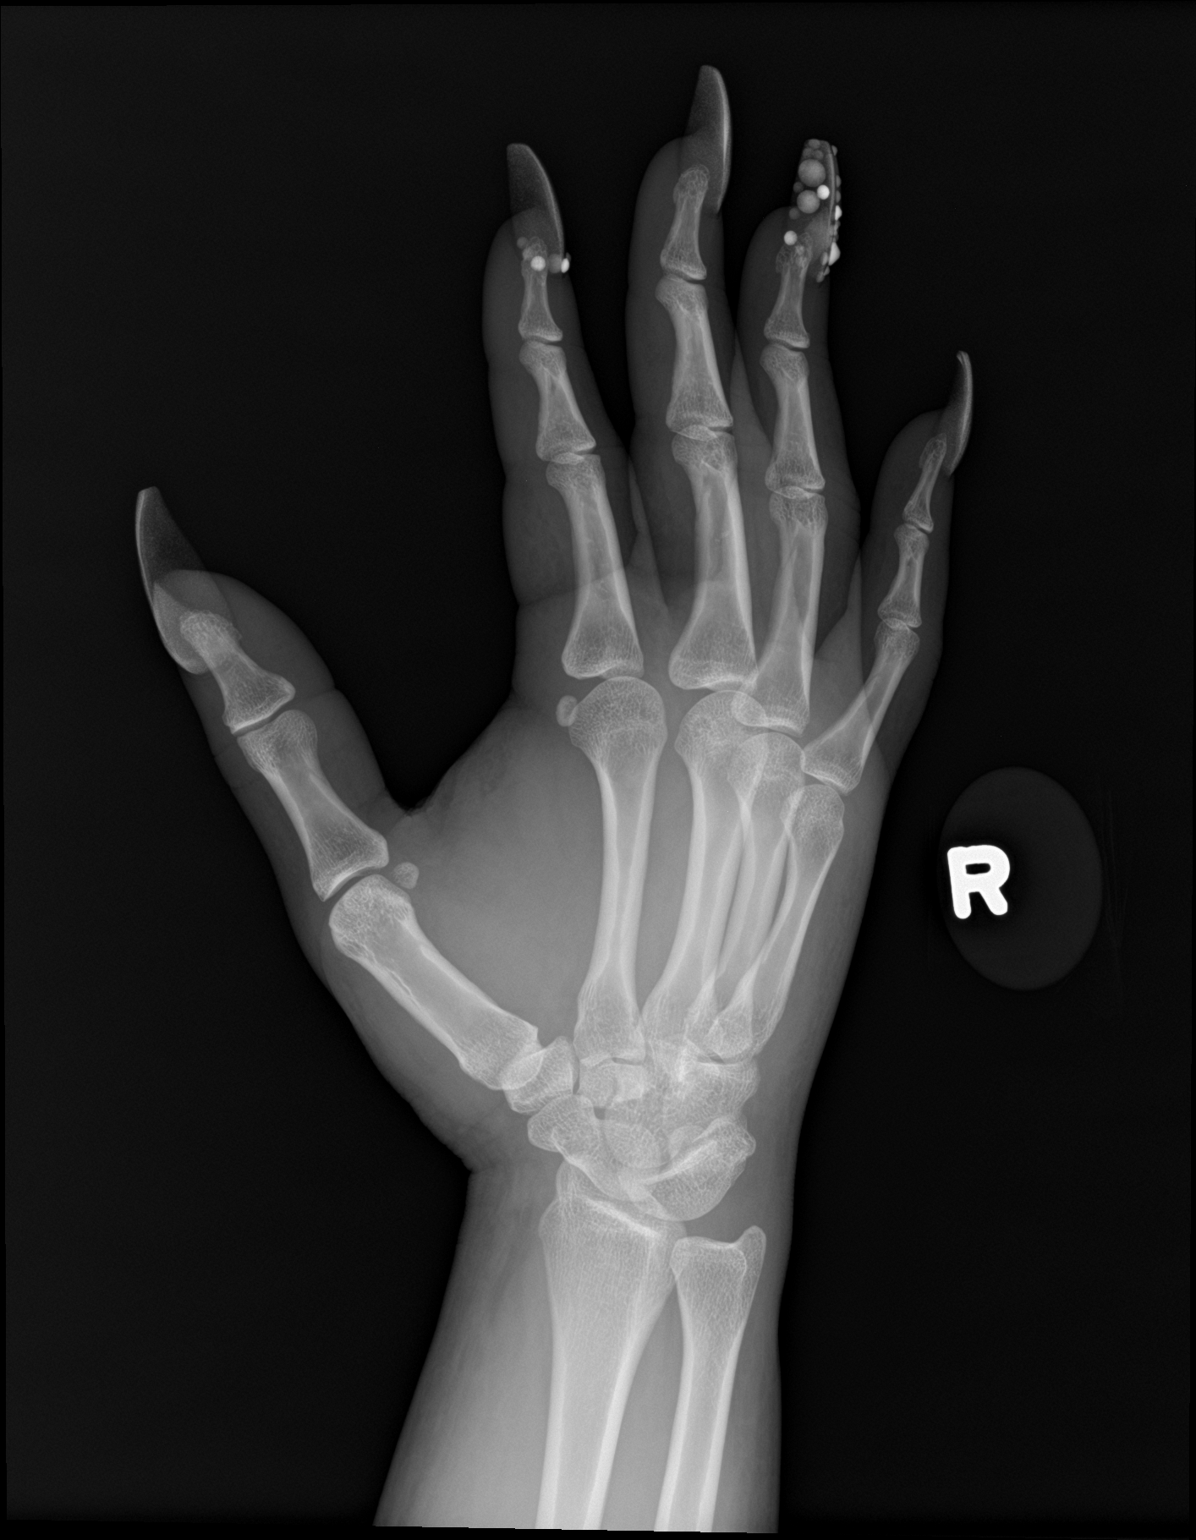

[hand lat]
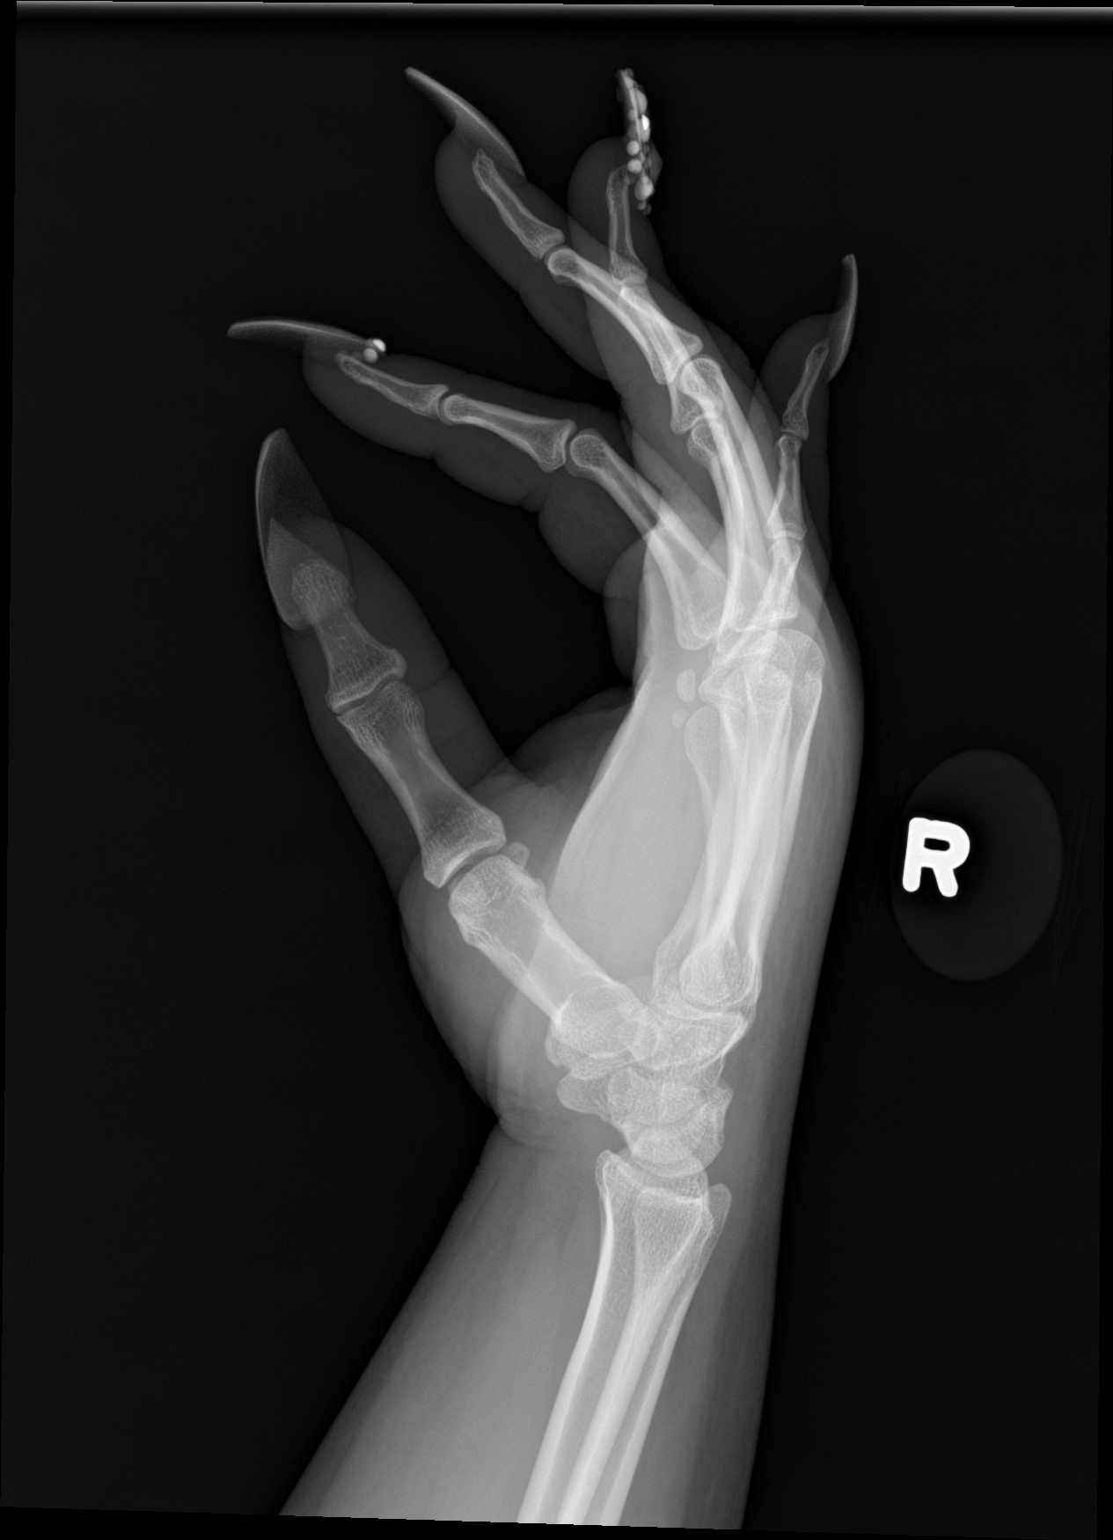

[3 of 3 positions shown; findings below may reference images not displayed]

FINDINGS: No acute fracture or dislocation. Probable soft tissue swelling
about the carpal bones, most apparent on the lateral view. No focal
osseous lesion. No soft tissue gas.
IMPRESSION: No acute osseous abnormality.

## 2018-08-17 ENCOUNTER — Encounter: Payer: Self-pay | Admitting: Family Medicine

## 2018-08-17 ENCOUNTER — Ambulatory Visit
Admission: RE | Admit: 2018-08-17 | Discharge: 2018-08-17 | Disposition: A | Discharge status: Home / Self Care | Payer: BLUE CROSS/BLUE SHIELD | Source: Ambulatory Visit | Attending: Family Medicine | Admitting: Family Medicine

## 2018-08-17 ENCOUNTER — Other Ambulatory Visit: Payer: Self-pay

## 2018-08-17 ENCOUNTER — Ambulatory Visit (INDEPENDENT_AMBULATORY_CARE_PROVIDER_SITE_OTHER): Payer: BLUE CROSS/BLUE SHIELD | Admitting: Family Medicine

## 2018-08-17 VITALS — BP 135/80 | HR 81

## 2018-08-17 DIAGNOSIS — L03116 Cellulitis of left lower limb: Secondary | ICD-10-CM

## 2018-08-17 DIAGNOSIS — M25472 Effusion, left ankle: Secondary | ICD-10-CM

## 2018-08-17 DIAGNOSIS — M7989 Other specified soft tissue disorders: Secondary | ICD-10-CM | POA: Diagnosis not present

## 2018-08-17 MED ORDER — ONDANSETRON HCL 4 MG PO TABS: 4.0000 mg | ORAL_TABLET | Freq: Three times a day (TID) | ORAL | 0 refills | Status: DC | PRN

## 2018-08-17 MED ORDER — CEPHALEXIN 500 MG PO CAPS: 500.0000 mg | ORAL_CAPSULE | Freq: Four times a day (QID) | ORAL | 0 refills | Status: AC

## 2018-08-17 NOTE — Progress Notes (Signed)
  Patient Name: Chauncey Reading Date of Birth: 1986/01/30 Date of Visit: 08/17/18 PCP: Cleophas Dunker, DO  Chief Complaint:  Redness and swelling of leg   Subjective: GINA LEBLOND is a pleasant 33 y.o. with medical history significant for obesity, chronic venous stasis of the right leg,  presenting today for left lower extremity swelling and redness.   This began 4 days ago as mild redness over the anterior lateral aspect of the left leg. Since that time, she has developed pain and mild ankle edema. No fevers, nausea (other than baseline), vomiting, rigors, chills, calf pain, knee pain. No recent trauma. No personal or family history of VTE. No recent travel. No recent increase in activity. No history of gout.  She reports her pain is mild to moderate, she has been able to walk. She has been taking Tylenol intermittently for pain. Allergies only to vancomycin.  The patient reports chronic 'motion sickness'. She has nausea when she drives a car or sits in the front seat. Ongoing for 1 year. No headaches, vision changes, or dizziness.    ROS:  ROS  I have reviewed the patient's medical, surgical, family, and social history as appropriate.   Vitals:   08/17/18 1108  BP: 135/80  Pulse: 81  SpO2: 99%  Afebrile 98.2 Cardiac: Regular rate and rhythm. Normal S1/S2. No murmurs, rubs, or gallops appreciated. Lungs: Clear bilaterally to ascultation.   Extremities: Right lower extremity measures 41 cm, edematous, hyperpigmentation of skin- chronic unchanged  On left anterior medial and lateral leg there is an area of faint erythema, splotchy, irregular, talofibular joint with small effusion, edema of dorsum of foot, mild pain with exam, not out of proportion to redness and swelling, mild redness in area  Measures 40 cm  Skin: Warm, dry Psych: Pleasant and appropriate    Laneka was seen today for leg pain and leg swelling.  Diagnoses and all orders for this visit:   Cellulitis of left lower extremity, no fevers or systemic symptoms, erythema has not increased over past few days, most likely represents cellulitis, no evidence of severe infection or abscess one exam, does have moderate edema, which has been present before. Less likely DVT- measures similar to right leg, no posterior calf pain, no estrogen use, no history of VTE. Less likely gouty arthritis of ankle as erythema extends superiorly.  -     Basic Metabolic Panel -     CBC with Differential -     C-reactive protein -     cephALEXin (KEFLEX) 500 MG capsule; Take 1 capsule (500 mg total) by mouth 4 (four) times daily for 7 days. -     ondansetron (ZOFRAN) 4 MG tablet; Take 1 tablet (4 mg total) by mouth every 8 (eight) hours as needed for nausea or vomiting. - Reviewed strict return precautions and reasons to call and return to care including worsening pain, fevers, or increase in redness.   Left ankle swelling -     DG Ankle Complete Left; Future  Motion sickness, consider acupressure bands or ginger before driving.   Dorris Singh, MD  Family Medicine Teaching Service

## 2018-08-17 NOTE — Patient Instructions (Signed)
It was wonderful to see you today.  Thank you for choosing [facility].   Please call [phone] with any questions about today's appointment.  Please be sure to schedule follow up at the front  desk before you leave today.   Carina Brown, MD  Family Medicine    

## 2018-08-18 ENCOUNTER — Telehealth: Payer: Self-pay | Admitting: Family Medicine

## 2018-08-18 DIAGNOSIS — L03116 Cellulitis of left lower limb: Secondary | ICD-10-CM

## 2018-08-18 LAB — BASIC METABOLIC PANEL
BUN/Creatinine Ratio: 14 (ref 9–23)
BUN: 12 mg/dL (ref 6–20)
CO2: 23 mmol/L (ref 20–29)
Calcium: 8.9 mg/dL (ref 8.7–10.2)
Chloride: 102 mmol/L (ref 96–106)
Creatinine, Ser: 0.87 mg/dL (ref 0.57–1.00)
GFR calc Af Amer: 101 mL/min/{1.73_m2} (ref >59)
GFR calc non Af Amer: 87 mL/min/{1.73_m2} (ref >59)
Glucose: 80 mg/dL (ref 65–99)
Potassium: 3.9 mmol/L (ref 3.5–5.2)
Sodium: 139 mmol/L (ref 134–144)

## 2018-08-18 LAB — CBC WITH DIFFERENTIAL/PLATELET
Basophils Absolute: 0 10*3/uL (ref 0.0–0.2)
Basos: 0 %
EOS (ABSOLUTE): 0.2 10*3/uL (ref 0.0–0.4)
Eos: 2 %
Hematocrit: 36.7 % (ref 34.0–46.6)
Hemoglobin: 11.5 g/dL (ref 11.1–15.9)
Immature Grans (Abs): 0 10*3/uL (ref 0.0–0.1)
Immature Granulocytes: 0 %
Lymphocytes Absolute: 3.6 10*3/uL — ABNORMAL HIGH (ref 0.7–3.1)
Lymphs: 47 %
MCH: 26 pg — ABNORMAL LOW (ref 26.6–33.0)
MCHC: 31.3 g/dL — ABNORMAL LOW (ref 31.5–35.7)
MCV: 83 fL (ref 79–97)
Monocytes Absolute: 0.5 10*3/uL (ref 0.1–0.9)
Monocytes: 6 %
Neutrophils Absolute: 3.6 10*3/uL (ref 1.4–7.0)
Neutrophils: 45 %
Platelets: 292 10*3/uL (ref 150–450)
RBC: 4.42 x10E6/uL (ref 3.77–5.28)
RDW: 13.1 % (ref 11.7–15.4)
WBC: 7.9 10*3/uL (ref 3.4–10.8)

## 2018-08-18 LAB — C-REACTIVE PROTEIN: CRP: 15 mg/L — ABNORMAL HIGH (ref 0–10)

## 2018-08-18 MED ORDER — NAPROXEN 500 MG PO TABS: 500.0000 mg | ORAL_TABLET | Freq: Two times a day (BID) | ORAL | 0 refills | Status: DC

## 2018-08-18 NOTE — Telephone Encounter (Signed)
Called patient with results-- CRP mildly elevated, suggestive of infection. All other labs normal. Patient will call if symptoms not improved. Dorris Singh, MD  Family Medicine Teaching Service

## 2018-08-19 ENCOUNTER — Encounter: Payer: Self-pay | Admitting: Family Medicine

## 2018-08-19 NOTE — Progress Notes (Signed)
Letter in letters tab. Please call patient and let her know--- if her swelling and redness is not improved, should be seen tomorrow.   Thank you, Dorris Singh, MD  [facility] Medicine Teaching Service

## 2018-08-19 NOTE — Telephone Encounter (Signed)
Pt states that the redness and swelling is not worse but also has not improved.   Appt made for Friday, she will call and cancel if her ankle starts to improve.  She will pick up letter tomorrow, placed at the front [facility], [place]

## 2018-08-19 NOTE — Telephone Encounter (Signed)
Pt calls because she is still having pain in her leg and is requesting a letter to allow her to be out of work one more day.  Christen Bame, CMA

## 2018-08-19 NOTE — Telephone Encounter (Signed)
Letter in letters tab for tomorrow---please let patient know if redness and ankle swelling not improved she should be seen.   Thanks, Dorris Singh, MD  [facility] Medicine Teaching Service

## 2018-08-21 ENCOUNTER — Ambulatory Visit: Payer: BLUE CROSS/BLUE SHIELD | Admitting: Family Medicine

## 2018-09-07 DIAGNOSIS — Z20828 Contact with and (suspected) exposure to other viral communicable diseases: Secondary | ICD-10-CM | POA: Diagnosis not present

## 2018-11-24 ENCOUNTER — Other Ambulatory Visit: Payer: Self-pay

## 2018-11-24 ENCOUNTER — Encounter: Payer: BLUE CROSS/BLUE SHIELD | Admitting: Family Medicine

## 2018-11-24 ENCOUNTER — Encounter: Payer: Self-pay | Admitting: Family Medicine

## 2018-11-24 ENCOUNTER — Ambulatory Visit (INDEPENDENT_AMBULATORY_CARE_PROVIDER_SITE_OTHER): Payer: BC Managed Care – PPO | Admitting: Family Medicine

## 2018-11-24 VITALS — BP 124/84 | HR 84 | Wt 212.6 lb

## 2018-11-24 DIAGNOSIS — G43709 Chronic migraine without aura, not intractable, without status migrainosus: Secondary | ICD-10-CM | POA: Diagnosis not present

## 2018-11-24 DIAGNOSIS — Z3009 Encounter for other general counseling and advice on contraception: Secondary | ICD-10-CM | POA: Insufficient documentation

## 2018-11-24 DIAGNOSIS — Z6841 Body Mass Index (BMI) 40.0 and over, adult: Secondary | ICD-10-CM | POA: Insufficient documentation

## 2018-11-24 DIAGNOSIS — Z0000 Encounter for general adult medical examination without abnormal findings: Secondary | ICD-10-CM | POA: Diagnosis not present

## 2018-11-24 DIAGNOSIS — R600 Localized edema: Secondary | ICD-10-CM | POA: Diagnosis not present

## 2018-11-24 MED ORDER — SUMATRIPTAN SUCCINATE 50 MG PO TABS: 50.0000 mg | ORAL_TABLET | ORAL | 1 refills | Status: DC | PRN

## 2018-11-24 NOTE — Assessment & Plan Note (Signed)
Check lipids and CMP.  Discussed weight, proper diet, and exercise.

## 2018-11-24 NOTE — Assessment & Plan Note (Signed)
Significant BLE edema and worsening per patient.  Does report family history of heart failure and has occasionally DOE.  Has multiple venous dopplers of BLE in chart without abnormality, but no Echo.  Will order to assess for possible heart failure.  If normal, likely venous insuffiencey.

## 2018-11-24 NOTE — Assessment & Plan Note (Signed)
Form signed for foster parenting.  Will obtain Lipid panel, CMP, Hep C for screening.  HIV has been negative in past, no concern for STDs per patient.

## 2018-11-24 NOTE — Assessment & Plan Note (Signed)
Neurologically intact on exam.  No changes in vision.  Will start imitrex prn and cont ibuorpfen prn.  Return in 2 months for follow up.

## 2018-11-24 NOTE — Patient Instructions (Signed)
Thank you for coming to see me today. It was a pleasure. Today we talked about:   Your headache: Will will start a new medication.  Use as directed.  We have given you your form.  You will have an ultrasound of your heart.  Try acupressure bands for nausea and ginger before you drive.  Please follow-up with me in 2 months.  If you have any questions or concerns, please do not hesitate to call the office at [phone].  Best,   [place] Constable, DO

## 2018-11-24 NOTE — Progress Notes (Signed)
Subjective:  CC -- Annual Physical; With complaints of the following:   Migraines started again in the last month-month and a half.  Had not had migraines in the last year.  Notes that they feel like her normal migraines.  No changes in vision, no aura.  Getting them every day now.  Uses ibuprofen with some improvement.  Usually last a few hours.  Accompanied by nausea.  No vomiting.  Photophobia and phonophobia.    Cycle is usually off.  States that if it has been "a long time"  Since her last period, she will feel very ill before her period, with nausea and headache, sometimes chills, and then she will improve with menses.  Menses 3-4 days.  28-40 days between.  Has been on birth control in the past "a long time ago."  Has talked to OB-GYN about this.  Considering going back on birth control.  Leg swelling, uses Ted hose.  States that they have not been working any more.  Swelling has been occurring for years.  Ted hose stopped working a few months ago.  Worsens after standing.  No chest pain, shortness of breath, orthopnea. Does have intermittent DOE.  Reports that her father's family has a history of heart failure.  Cardiovascular: - Dx Hypertension: no  - Dx Hyperlipidemia: no  - Dx Obesity: yes, discussed need for diet and exercise  - Physical Activity: no, has a 35 yr old that keeps her busy  - Diabetes: no   Cancer: Colorectal >> Colonoscopy: not indicated Lung >> Tobacco Use: no  Breast >> Mammogram: not indicated Cervical/Endometrial >>  - Postmenopausal: no  - Vaginal Bleeding: yes, with irregular periods - Pap Smear: yes   Social: Alcohol Use: yes, social  Tobacco Use: no  Other Drugs: no  Risky Sexual Behavior: no  Depression: no  Support and Life at Home: yes   Other: Flu Vaccine: yes, reports that she got at work   ROS per history above  Past Medical History Patient Active Problem List   Diagnosis Date Noted  . Annual physical exam 11/24/2018  . Class 3  severe obesity with body mass index (BMI) of 45.0 to 49.9 in adult ([place]) 11/24/2018  . Mild intermittent asthma with acute exacerbation 01/28/2018  . Low back pain without sciatica 09/23/2017  . Plantar fasciitis of right foot 02/19/2017  . Endometrial thickening on ultrasound 02/19/2017  . Paronychia of finger of right hand 12/29/2016  . Bilateral leg edema 10/21/2011  . Migraine 07/26/2006  . ASTHMA 07/26/2006  . HX, PERSONAL, EXPOSURE TO LEAD 07/26/2006    Medications- reviewed and updated Current Outpatient Medications  Medication Sig Dispense Refill  . albuterol (PROVENTIL HFA;VENTOLIN HFA) 108 (90 Base) MCG/ACT inhaler TAKE 2 PUFFS BY MOUTH EVERY 6 HOURS AS NEEDED FOR WHEEZE OR SHORTNESS OF BREATH 6.7 Inhaler 2  . ALBUTEROL IN Inhale into the lungs as needed. PER PT LAST USED 2014    . fluticasone (FLOVENT HFA) 110 MCG/ACT inhaler Inhale 1 puff into the lungs 2 (two) times daily. 1 Inhaler 12  . ibuprofen (ADVIL,MOTRIN) 200 MG tablet Take 200 mg by mouth every 6 (six) hours as needed.    . ondansetron (ZOFRAN) 4 MG tablet Take 1 tablet (4 mg total) by mouth every 8 (eight) hours as needed for nausea or vomiting. 20 tablet 0  . SUMAtriptan (IMITREX) 50 MG tablet Take 1 tablet (50 mg total) by mouth every 2 (two) hours as needed for migraine. May repeat in  2 hours if headache persists or recurs. 10 tablet 1   No current facility-administered medications for this visit.     Objective: BP 124/84   Pulse 84   Wt 212 lb 9.6 oz (96.4 kg)   LMP 11/11/2018 (Exact Date)   SpO2 98%   BMI 40.17 kg/m  Gen: NAD, alert, cooperative with exam HEENT: NCAT, EOMI, PERRL CV: RRR, good S1/S2, no murmur Resp: CTABL, no wheezes, non-labored Abd: Soft, Non Tender, Non Distended, BS present, no guarding or organomegaly Genital Exam: not done Ext: 2+ pitting edema to knee bilaterally, warm Neuro: Alert and oriented, No gross deficits, CN II-XII grossly intact, sensation intact throughout,  negative finger to nose, negative Marye Round   Assessment/Plan:  Annual physical exam Form signed for foster parenting.  Will obtain Lipid panel, CMP, Hep C for screening.  HIV has been negative in past, no concern for STDs per patient.  Class 3 severe obesity with body mass index (BMI) of 45.0 to 49.9 in adult [facility]) Check lipids and CMP.  Discussed weight, proper diet, and exercise.  Migraine Neurologically intact on exam.  No changes in vision.  Will start imitrex prn and cont ibuorpfen prn.  Return in 2 months for follow up.  Bilateral leg edema Significant BLE edema and worsening per patient.  Does report family history of heart failure and has occasionally DOE.  Has multiple venous dopplers of BLE in chart without abnormality, but no Echo.  Will order to assess for possible heart failure.  If normal, likely venous insuffiencey.   Orders Placed This Encounter  Procedures  . Lipid Panel    Order Specific Question:   Has the patient fasted?    Answer:   No  . Comprehensive metabolic panel    Order Specific Question:   Has the patient fasted?    Answer:   No  . Hepatitis C antibody  . ECHOCARDIOGRAM COMPLETE    Standing Status:   Future    Standing Expiration Date:   02/23/2020    Order Specific Question:   Where should this test be performed    Answer:   [facility]    Order Specific Question:   Perflutren DEFINITY (image enhancing agent) should be administered unless hypersensitivity or allergy exist    Answer:   Administer Perflutren    Order Specific Question:   Reason for exam-Echo    Answer:   Other-Full Diagnosis List    Order Specific Question:   Full ICD-10/Reason for Exam    Answer:   Edema [782.3.ICD-9-CM]    Order Specific Question:   Other Comments    Answer:   BLE edema    Meds ordered this encounter  Medications  . SUMAtriptan (IMITREX) 50 MG tablet    Sig: Take 1 tablet (50 mg total) by mouth every 2 (two) hours as needed for migraine. May repeat in 2  hours if headache persists or recurs.    Dispense:  10 tablet    Refill:  [place], DO, PGY-2 11/24/2018 11:28 AM

## 2018-11-25 LAB — COMPREHENSIVE METABOLIC PANEL
ALT: 6 IU/L (ref 0–32)
AST: 15 IU/L (ref 0–40)
Albumin/Globulin Ratio: 1.3 (ref 1.2–2.2)
Albumin: 4 g/dL (ref 3.8–4.8)
Alkaline Phosphatase: 105 IU/L (ref 39–117)
BUN/Creatinine Ratio: 12 (ref 9–23)
BUN: 12 mg/dL (ref 6–20)
Bilirubin Total: 0.4 mg/dL (ref 0.0–1.2)
CO2: 22 mmol/L (ref 20–29)
Calcium: 8.9 mg/dL (ref 8.7–10.2)
Chloride: 104 mmol/L (ref 96–106)
Creatinine, Ser: 0.98 mg/dL (ref 0.57–1.00)
GFR calc Af Amer: 87 mL/min/{1.73_m2} (ref >59)
GFR calc non Af Amer: 76 mL/min/{1.73_m2} (ref >59)
Globulin, Total: 3 g/dL (ref 1.5–4.5)
Glucose: 84 mg/dL (ref 65–99)
Potassium: 4.1 mmol/L (ref 3.5–5.2)
Sodium: 142 mmol/L (ref 134–144)
Total Protein: 7 g/dL (ref 6.0–8.5)

## 2018-11-25 LAB — HEPATITIS C ANTIBODY: Hep C Virus Ab: <0.1 s/co ratio (ref 0.0–0.9)

## 2018-11-25 LAB — LIPID PANEL
Chol/HDL Ratio: 4.8 ratio — ABNORMAL HIGH (ref 0.0–4.4)
Cholesterol, Total: 221 mg/dL — ABNORMAL HIGH (ref 100–199)
HDL: 46 mg/dL (ref >39)
LDL Chol Calc (NIH): 152 mg/dL — ABNORMAL HIGH (ref 0–99)
Triglycerides: 130 mg/dL (ref 0–149)
VLDL Cholesterol Cal: 23 mg/dL (ref 5–40)

## 2018-11-26 ENCOUNTER — Ambulatory Visit (HOSPITAL_COMMUNITY)
Admission: RE | Admit: 2018-11-26 | Discharge: 2018-11-26 | Disposition: A | Discharge status: Home / Self Care | Payer: BC Managed Care – PPO | Source: Ambulatory Visit | Attending: Family Medicine | Admitting: Family Medicine

## 2018-11-26 ENCOUNTER — Other Ambulatory Visit: Payer: Self-pay

## 2018-11-26 DIAGNOSIS — R600 Localized edema: Secondary | ICD-10-CM | POA: Diagnosis not present

## 2018-11-26 NOTE — Progress Notes (Signed)
  Echocardiogram 2D Echocardiogram has been performed.  Marybelle Killings 11/26/2018, 4:14 PM

## 2018-12-07 ENCOUNTER — Encounter: Payer: Self-pay | Admitting: Gynecology

## 2019-01-20 ENCOUNTER — Encounter: Payer: Self-pay | Admitting: Family Medicine

## 2019-01-20 ENCOUNTER — Ambulatory Visit: Payer: BC Managed Care – PPO | Admitting: Family Medicine

## 2019-01-20 ENCOUNTER — Other Ambulatory Visit: Payer: Self-pay

## 2019-01-20 VITALS — BP 122/68 | HR 100 | Wt 215.0 lb

## 2019-01-20 DIAGNOSIS — F418 Other specified anxiety disorders: Secondary | ICD-10-CM | POA: Insufficient documentation

## 2019-01-20 DIAGNOSIS — F419 Anxiety disorder, unspecified: Secondary | ICD-10-CM | POA: Insufficient documentation

## 2019-01-20 MED ORDER — VENLAFAXINE HCL ER 37.5 MG PO CP24: 75.0000 mg | ORAL_CAPSULE | Freq: Every day | ORAL | Status: DC

## 2019-01-20 NOTE — Patient Instructions (Signed)
  Hi Aubery,  It was lovely to meet you today!! I am sorry that you have been feeling anxious over the last 3-4 months. I have prescribed you Venlafaxine which is a medication which can be taken for anxiety/low mood.You took this previously for your migraines and had no side effects which I am hoping for now.   Please take this medication once a day with breakfast, normal side effects include nausea, vomiting, headache, insomnia etc. Avoid taking this medication at night.   Please come and see [place] in 1-2 weeks time for a follow up to ensure you are doing well with this medication. If you have any other questions please do not hesitate to contact us at the clinic.  Go to the ER if you develop chest pain, shortness of breath, persistent vomiting, drowsy or you feel you need emergency medical attention.   Best wishes,  Britania Shreeve

## 2019-01-20 NOTE — Assessment & Plan Note (Addendum)
Treating as Anxiety secondary to Adjustment disorder or social anxiety as only persist for 3-4 months. May evolve into GAD at > 6 months. Unlikely to be panic disorder as no obvious panic symptoms of sweating Palpitations abdominal distress/nausea and no panic attacks. Starting Venlaflaxine 75 mg XR today once daily as pt has taken this for migraines before and tolerated well. Considered CBT but patient rather try medication first. Asked pt to come back in a few weeks to reassess her anxiety symptoms.

## 2019-01-20 NOTE — Progress Notes (Signed)
   Subjective:    Patient ID: Jacqueline Woodard, female    DOB: 11-19-1985, 33 y.o.   MRN: [id number]   CC: Amarelis is a 33 yr old who presents today with anxiety   HPI:  Anxiety Pt has no known hx of anxiety, started to develop anxiety 3 to 4 months ago which has worsened over the last month.  Waves of anxiety usually last between 10 to 45 minutes and occur at least once a day. No known triggers for anxiety.  Usually alleviated by taking a hot shower or being with the nature.  Anxiety comes on at random times. Yesterday she was at the doctor's office with her son and suddenly felt anxious and started to hyperventilate and wanted to escape. She says wearing a facemask all the time has contributed to the anxiety. She does not think the coronavirus has not impact on her anxiety. Another time she felt anxious was at work when there were too many people around her, she felt hot and jittery and wanted to "escape from her body".  Other associated symptoms include restlessness, poor sleep: finds it hard to fall asleep and wakes up at least once or twice a night.  Is still able to function at work and perform her daily activities. Denies anhedonia, fatigue, decreased libido, decreased appetite, thoughts of self-harm or harm to others or suicidal ideation.   Smoking status reviewed   ROS: pertinent noted in the HPI    Past medical history, surgical, family, and social history reviewed and updated in the EMR as appropriate. Reviewed problem list.   Objective:  BP 122/68   Pulse 100   Wt 215 lb (97.5 kg)   SpO2 99%   BMI 40.62 kg/m   Vitals and nursing note reviewed  General: NAD, pleasant, able to participate in exam Cardiac: RRR, S1 S2 present. normal heart sounds, no murmurs. Respiratory: CTAB, normal effort, No wheezes, rales or rhonchi Extremities: no edema or cyanosis. Skin: warm and dry, no rashes noted Neuro: alert, no obvious focal deficits Psych: Normal affect and mood    Assessment & Plan:    Anxiety Treating as Anxiety secondary to Adjustment disorder or social anxiety as only persist for 3-4 months. May evolve into GAD at > 6 months. Unlikely to be panic disorder as no obvious panic symptoms of sweating Palpitations abdominal distress/nausea and no panic attacks. Starting Venlaflaxine 75 mg XR today once daily as pt has taken this for migraines before and tolerated well. Considered CBT but patient rather try medication first. Asked pt to come back in a few weeks to reassess her anxiety symptoms.    Lattie Haw, MD  [place] PGY-1

## 2019-01-21 ENCOUNTER — Other Ambulatory Visit: Payer: Self-pay | Admitting: Family Medicine

## 2019-01-21 MED ORDER — VENLAFAXINE HCL ER 75 MG PO CP24: 75.0000 mg | ORAL_CAPSULE | Freq: Every day | ORAL | 0 refills | Status: DC

## 2019-02-10 ENCOUNTER — Ambulatory Visit: Payer: BC Managed Care – PPO | Admitting: Family Medicine

## 2019-02-17 ENCOUNTER — Ambulatory Visit (INDEPENDENT_AMBULATORY_CARE_PROVIDER_SITE_OTHER): Payer: BC Managed Care – PPO | Admitting: Family Medicine

## 2019-02-17 ENCOUNTER — Encounter: Payer: Self-pay | Admitting: Family Medicine

## 2019-02-17 ENCOUNTER — Other Ambulatory Visit: Payer: Self-pay

## 2019-02-17 VITALS — BP 124/80 | HR 79 | Ht 61.0 in | Wt 208.2 lb

## 2019-02-17 DIAGNOSIS — G43709 Chronic migraine without aura, not intractable, without status migrainosus: Secondary | ICD-10-CM

## 2019-02-17 DIAGNOSIS — F419 Anxiety disorder, unspecified: Secondary | ICD-10-CM

## 2019-02-17 DIAGNOSIS — R1084 Generalized abdominal pain: Secondary | ICD-10-CM

## 2019-02-17 LAB — POCT HEMOGLOBIN: Hemoglobin: 12 g/dL (ref 11–14.6)

## 2019-02-17 MED ORDER — ONDANSETRON HCL 4 MG PO TABS: 4.0000 mg | ORAL_TABLET | Freq: Three times a day (TID) | ORAL | 0 refills | Status: DC | PRN

## 2019-02-17 MED ORDER — ESOMEPRAZOLE MAGNESIUM 40 MG PO CPDR: 40.0000 mg | DELAYED_RELEASE_CAPSULE | Freq: Every day | ORAL | 3 refills | Status: DC

## 2019-02-17 NOTE — Patient Instructions (Addendum)
Thank you for coming to see me today. It was a pleasure. Today we talked about:   Your anxiety: Continue your venlafaxine.  Try exercise at least 30 minutes a day.  You can also consider melatonin at night, which you can get over the counter.  Your migraines: I have refilled your Zofran  Your stomach pain: Avoid medications such as ibuprofen, Advil, Aleve, Motrin.  I sent a prescription to the pharmacy for Nexium, if it is covered by your insurance you can get it through them, otherwise she can get it over-the-counter.  Please take this once a day for the next month and then come back to see me.  If you have worsening in the meantime, dizziness, chest pain, trouble breathing, blood in your stool, or black stools please come back right away.  If you have any questions or concerns, please do not hesitate to call the office at [phone].  Best,   [place] Constable, DO

## 2019-02-17 NOTE — Progress Notes (Addendum)
Subjective: Chief Complaint  Patient presents with  . Medication Management     HPI: Jacqueline Woodard is a 33 y.o. presenting to clinic today to discuss the following:  F/U Anxiety  Patient started on Venlafaxine on 11/4 by Dr. Posey Pronto.  GAD-7 at that time was 11.  States that she is yawning a lot, and feels jittery after she yawns.  Has also been having difficulty sleeping.  Feels like anxiety has improved.  States that she wakes up once every night.    GAD 7 : Generalized Anxiety Score 02/17/2019 01/20/2019  Nervous, Anxious, on Edge 1 2  Control/stop worrying 0 1  Worry too much - different things 1 3  Trouble relaxing 2 2  Restless 1 1  Easily annoyed or irritable 0 0  Afraid - awful might happen 0 2  Total GAD 7 Score 5 11     Migraines Notes using imitrex and zofran for migraines because she has a lot of associated nausea.  She would like a refill of the zofran.  Stomach Pain Feels like when she had her ulcer in 2015.  Reports EGD then and used to follow with GI.  Feels uncomfortable and "sick on my stomach."  "makes me miserable."  Tried nexium, yogurt, pepto bismol.  Pepto bismol doesn't help.  Nexium help, only takes as needed.  Happens almost every day.  Denies melena or hematochezia.  Previously was taking a lot of ibuprofen for headaches, but has cut back in the last month.  Pain was worse when she was taking the ibuprofen.  Now she is taking advil liquidgels.  Denies chest pain, SOB, dizziness.      ROS noted in HPI. Chief complaint noted.  Other Pertinent PMH: Asthma, Anxiety, Migraines Past Medical, Surgical, Social, and Family History Reviewed & Updated per EMR.      Social History   Tobacco Use  Smoking Status Never Smoker  Smokeless Tobacco Never Used   Smoking status noted.    Objective: BP 124/80   Pulse 79   Ht 5\' 1"  (1.549 m)   Wt 208 lb 4 oz (94.5 kg)   LMP 01/31/2019   SpO2 99%   BMI 39.35 kg/m  Vitals and nursing notes reviewed   Physical Exam:  General: 33 y.o. female in NAD Lungs: Breathing comfortably on RA Abdomen: Soft, mild epigastric tenderness, non-distended, positive bowel sounds Skin: warm and dry   Results for orders placed or performed in visit on 02/17/19 (from the past 72 hour(s))  Hemoglobin     Status: None   Collection Time: 02/17/19  2:45 PM  Result Value Ref Range   Hemoglobin 12 11 - 14.6 g/dL    Assessment/Plan:  Anxiety Discussed with patient that given her concern for sleep, could consider increase in venlafaxine, as could be uncontrolled anxiety, although seems better controlled, or could change therapy to see if this would help.  Also discussed continuing current therapy and incorporating 30 minutes of exercise into her routine daily and considering melatonin at night.  She would like to proceed with this.  Also recommended yoga for exercise and relaxation. - cont venlafazine - exercise 30 mins QD - consider melatonin QHS PRN   Generalized abdominal pain Hx ulcer.  No symptoms of anemia.  Hgb 12.  Has been taking a lot of NSAIDs, which could cause ulcer recurrence.  Discussed with patient PPI trial for one month and avoidance of NSAIDs or referral to GI.  She prefers to  try PPI first.  Will follow up in 1 month.  Migraine Zofran refilled for nausea with migraines.     PATIENT EDUCATION PROVIDED: See AVS    Diagnosis and plan along with any newly prescribed medication(s) were discussed in detail with this patient today. The patient verbalized understanding and agreed with the plan. Patient advised if symptoms worsen return to clinic or ER.    Orders Placed This Encounter  Procedures  . Hemoglobin    Meds ordered this encounter  Medications  . ondansetron (ZOFRAN) 4 MG tablet    Sig: Take 1 tablet (4 mg total) by mouth every 8 (eight) hours as needed for nausea or vomiting.    Dispense:  20 tablet    Refill:  0  . esomeprazole (NEXIUM) 40 MG capsule    Sig: Take 1  capsule (40 mg total) by mouth daily.    Dispense:  30 capsule    Refill:  [place], DO 02/17/2019, 6:03 PM PGY-2 [place]

## 2019-02-17 NOTE — Assessment & Plan Note (Signed)
Hx ulcer.  No symptoms of anemia.  Hgb 12.  Has been taking a lot of NSAIDs, which could cause ulcer recurrence.  Discussed with patient PPI trial for one month and avoidance of NSAIDs or referral to GI.  She prefers to try PPI first.  Will follow up in 1 month.

## 2019-02-17 NOTE — Assessment & Plan Note (Signed)
Discussed with patient that given her concern for sleep, could consider increase in venlafaxine, as could be uncontrolled anxiety, although seems better controlled, or could change therapy to see if this would help.  Also discussed continuing current therapy and incorporating 30 minutes of exercise into her routine daily and considering melatonin at night.  She would like to proceed with this.  Also recommended yoga for exercise and relaxation. - cont venlafazine - exercise 30 mins QD - consider melatonin QHS PRN

## 2019-02-17 NOTE — Assessment & Plan Note (Signed)
Zofran refilled for nausea with migraines.

## 2019-03-14 ENCOUNTER — Other Ambulatory Visit: Payer: Self-pay | Admitting: Family Medicine

## 2019-03-25 ENCOUNTER — Ambulatory Visit: Payer: BC Managed Care – PPO | Admitting: Family Medicine

## 2019-03-25 ENCOUNTER — Other Ambulatory Visit: Payer: Self-pay

## 2019-03-26 ENCOUNTER — Ambulatory Visit: Payer: BC Managed Care – PPO | Admitting: Family Medicine

## 2019-05-12 ENCOUNTER — Telehealth: Payer: Self-pay | Admitting: *Deleted

## 2019-05-12 NOTE — Telephone Encounter (Signed)
Received fax requesting refill on esomeprazole. I called pharmacy and she said that she does have one 30 day supply left but that the last Rx she picked up was for a 90 day supply which her insurance prefers.  I told pharmacy that I would send this to you so we could refill to a 90 day supply. Gwenivere Hiraldo Zimmerman Rumple, CMA

## 2019-05-13 ENCOUNTER — Other Ambulatory Visit: Payer: Self-pay | Admitting: Family Medicine

## 2019-05-13 DIAGNOSIS — R1084 Generalized abdominal pain: Secondary | ICD-10-CM

## 2019-05-13 MED ORDER — ESOMEPRAZOLE MAGNESIUM 40 MG PO CPDR: 40.0000 mg | DELAYED_RELEASE_CAPSULE | Freq: Every day | ORAL | 3 refills | Status: DC

## 2019-05-13 NOTE — Progress Notes (Signed)
90 day rx at patient request

## 2019-05-13 NOTE — Telephone Encounter (Signed)
Rx sent for 90 day supply.

## 2019-07-09 ENCOUNTER — Ambulatory Visit (INDEPENDENT_AMBULATORY_CARE_PROVIDER_SITE_OTHER): Payer: BC Managed Care – PPO | Admitting: Family Medicine

## 2019-07-09 ENCOUNTER — Encounter: Payer: Self-pay | Admitting: Family Medicine

## 2019-07-09 ENCOUNTER — Other Ambulatory Visit: Payer: Self-pay

## 2019-07-09 VITALS — BP 130/80 | HR 78 | Ht 61.0 in | Wt 211.2 lb

## 2019-07-09 DIAGNOSIS — G8929 Other chronic pain: Secondary | ICD-10-CM | POA: Diagnosis not present

## 2019-07-09 DIAGNOSIS — G43709 Chronic migraine without aura, not intractable, without status migrainosus: Secondary | ICD-10-CM | POA: Diagnosis not present

## 2019-07-09 DIAGNOSIS — M25561 Pain in right knee: Secondary | ICD-10-CM | POA: Diagnosis not present

## 2019-07-09 DIAGNOSIS — R600 Localized edema: Secondary | ICD-10-CM | POA: Diagnosis not present

## 2019-07-09 LAB — POCT URINALYSIS DIP (MANUAL ENTRY)
Bilirubin, UA: NEGATIVE
Blood, UA: NEGATIVE
Glucose, UA: NEGATIVE mg/dL
Ketones, POC UA: NEGATIVE mg/dL
Leukocytes, UA: NEGATIVE
Nitrite, UA: NEGATIVE
Protein Ur, POC: NEGATIVE mg/dL
Spec Grav, UA: 1.02 (ref 1.010–1.025)
Urobilinogen, UA: 0.2 E.U./dL
pH, UA: 7 (ref 5.0–8.0)

## 2019-07-09 MED ORDER — ONDANSETRON HCL 4 MG PO TABS: 4.0000 mg | ORAL_TABLET | Freq: Three times a day (TID) | ORAL | 0 refills | Status: DC | PRN

## 2019-07-09 NOTE — Assessment & Plan Note (Addendum)
Has had numerous DVT ultrasounds that are negative given right swelling always worse than left.  Has not had any change in the swelling.  Previous echo within normal limits.  To complete work-up, will perform UA today.  UA negative for protein.  Most likely secondary to venous insufficiency.  Prescription written for 30 mmHg compression stockings, advised to take to DME supply.  Return in 1 to 2 months if no improvement.

## 2019-07-09 NOTE — Assessment & Plan Note (Signed)
Patient reports nausea that occurs with migraines.  Will refill Zofran.  Advised her to use very sparingly as this is not a medication that should be used long-term.  If she continues to require this, would consider readdressing migraines and consider controller medication.  Could also consider a GI referral.

## 2019-07-09 NOTE — Assessment & Plan Note (Signed)
Possible that patient has a plica given examination, although would be less likely in lateral aspect of knee.  Does seem to have some crepitus, therefore OA could be a factor, I think that her most important cause of knee pain at this time however is patellofemoral pain syndrome.  Patient has very weak abductors and positive Clark sign.  Discussed with patient treatment options including home exercises versus physical therapy.  Would opt to treat with physical therapy first.  Referral placed.  Has a history of gastric ulcer, therefore would avoid oral NSAID use.  Advised patient to obtain over-the-counter Voltaren gel and to use Tylenol as needed for pain.  Follow-up in 1 month if no improvement.

## 2019-07-09 NOTE — Progress Notes (Signed)
SUBJECTIVE:   CHIEF COMPLAINT / HPI:   Lower Extremity Swelling Continues to have lower extremity swelling for years Always worse in the summer Has been using OTC compression stockings No chest pain, shortness of breath, changes in urination Notes that pain in right knee is worse because she thinks the swelling is pushing up on it This has been occurring for a few weeks, and has happened previously when her swelling gets worse Takes tylenol as needed for pain  Nausea with migraines Takes zofran at the most 8 times in a month for severe nausea with migraines Usually only takes zofran if sumitriptan doesn't completely relieve nausea Migraines improve with sumitriptan Migraines and nausea have no changed No changes in vision  PERTINENT  PMH / PSH: Migraine, asthma, chronic BLE edema, anxiety, obesity  OBJECTIVE:   BP 130/80   Pulse 78   Ht 5\' 1"  (1.549 m)   Wt 211 lb 3.2 oz (95.8 kg)   LMP 07/06/2019 (Exact Date)   SpO2 98%   BMI 39.91 kg/m    Physical Exam:  General: 34 y.o. female in NAD Cardio: RRR no m/r/g Lungs: CTAB, no wheezing, no rhonchi, no crackles, no IWOB on RA Skin: warm and dry Extremities: 1+ pitting edema BLE, right slightly worse than left  Right Knee: - Inspection: no gross deformity. No swelling/effusion of knee, erythema or bruising bilaterally. Skin intact - Palpation: no TTP - ROM: full active ROM with flexion and extension in knee and hip, crepitus with knee ROM on right and palpable clicking and thickening in lateral aspect of knee with knee extension - Strength: 5/5 strength in all fields bilaterally except right hip abduction 4/5 - Neuro/vasc: NV intact b/l - Special Tests: - LIGAMENTS: negative anterior and posterior drawer, negative Lachman's, no MCL or LCL laxity bilaterally -- MENISCUS: negative McMurray's b/l -- PF JOINT: positive Clark's on right   Hips: normal ROM, negative FABER and FADIR bilaterally  Results for orders placed  or performed in visit on 07/09/19 (from the past 24 hour(s))  POCT urinalysis dipstick     Status: None   Collection Time: 07/09/19 11:00 AM  Result Value Ref Range   Color, UA yellow yellow   Clarity, UA clear clear   Glucose, UA negative negative mg/dL   Bilirubin, UA negative negative   Ketones, POC UA negative negative mg/dL   Spec Grav, UA 1.020 1.010 - 1.025   Blood, UA negative negative   pH, UA 7.0 5.0 - 8.0   Protein Ur, POC negative negative mg/dL   Urobilinogen, UA 0.2 0.2 or 1.0 E.U./dL   Nitrite, UA Negative Negative   Leukocytes, UA Negative Negative     ASSESSMENT/PLAN:   Bilateral leg edema Has had numerous DVT ultrasounds that are negative given right swelling always worse than left.  Has not had any change in the swelling.  Previous echo within normal limits.  To complete work-up, will perform UA today.  UA negative for protein.  Most likely secondary to venous insufficiency.  Prescription written for 30 mmHg compression stockings, advised to take to DME supply.  Return in 1 to 2 months if no improvement.  Chronic pain of right knee Possible that patient has a plica given examination, although would be less likely in lateral aspect of knee.  Does seem to have some crepitus, therefore OA could be a factor, I think that her most important cause of knee pain at this time however is patellofemoral pain syndrome.  Patient has  very weak abductors and positive Clark sign.  Discussed with patient treatment options including home exercises versus physical therapy.  Would opt to treat with physical therapy first.  Referral placed.  Has a history of gastric ulcer, therefore would avoid oral NSAID use.  Advised patient to obtain over-the-counter Voltaren gel and to use Tylenol as needed for pain.  Follow-up in 1 month if no improvement.  Migraine Patient reports nausea that occurs with migraines.  Will refill Zofran.  Advised her to use very sparingly as this is not a medication  that should be used long-term.  If she continues to require this, would consider readdressing migraines and consider controller medication.  Could also consider a GI referral.     Cleophas Dunker, [place]

## 2019-07-09 NOTE — Patient Instructions (Addendum)
Thank you for coming to see me today. It was a pleasure. Today we talked about:   We will check your urine.  I have given you a prescription to take to a medical supply store for compression stockings.  I have placed a referral to Physical Therapy for your knee.  If you do not hear from them in the next 2 weeks, please give [place] a call.  Please follow-up with me in 1 month if no improvement in your knee pain.  You can get voltaren gel over the counter for your knee.  If you have any questions or concerns, please do not hesitate to call the office at [phone].  Best,   [place] Constable, DO

## 2019-07-14 ENCOUNTER — Telehealth: Payer: Self-pay

## 2019-07-14 NOTE — Telephone Encounter (Signed)
Patient calls nurse line stating the DME order for compression stockings need to revised.  DME order needs to be revised to state 20-30 range or 30-40 range. DME needs to stated "thigh high" as well.   Patient will pick up revises order when ready.

## 2019-07-15 NOTE — Telephone Encounter (Signed)
Pt informed. Coltrane Tugwell T Beaumont [place], CMA  

## 2019-07-15 NOTE — Telephone Encounter (Signed)
Rx written per instructions and left at front.  Please call patient and advise of this.

## 2019-07-23 ENCOUNTER — Encounter: Payer: Self-pay | Admitting: Physical Therapy

## 2019-07-23 ENCOUNTER — Other Ambulatory Visit: Payer: Self-pay

## 2019-07-23 ENCOUNTER — Ambulatory Visit: Payer: BC Managed Care – PPO | Attending: Family Medicine | Admitting: Physical Therapy

## 2019-07-23 DIAGNOSIS — M6281 Muscle weakness (generalized): Secondary | ICD-10-CM | POA: Insufficient documentation

## 2019-07-23 DIAGNOSIS — R600 Localized edema: Secondary | ICD-10-CM | POA: Insufficient documentation

## 2019-07-23 DIAGNOSIS — M25561 Pain in right knee: Secondary | ICD-10-CM | POA: Diagnosis not present

## 2019-07-23 DIAGNOSIS — G8929 Other chronic pain: Secondary | ICD-10-CM | POA: Diagnosis not present

## 2019-07-23 DIAGNOSIS — R2689 Other abnormalities of gait and mobility: Secondary | ICD-10-CM | POA: Diagnosis not present

## 2019-07-23 DIAGNOSIS — M545 Low back pain: Secondary | ICD-10-CM | POA: Insufficient documentation

## 2019-07-23 NOTE — Patient Instructions (Signed)
Access Code: [email or web address] URL: [email or web address] Date: 07/23/2019 Prepared by: Hilda Blades  Exercises Supine Active Straight Leg Raise - 1 x daily - 7 x weekly - 10 reps - 3 sets Bridge - 1 x daily - 7 x weekly - 3 sets - 10 reps - 3 hold Sidelying Hip Abduction - 1 x daily - 7 x weekly - 3 sets - 10 reps Supine Quadriceps Stretch with Strap on Table - 1 x daily - 7 x weekly - 3 reps - 20 seconds hold Supine ITB Stretch with Strap - 1 x daily - 7 x weekly - 3 reps - 20 seconds hold

## 2019-07-23 NOTE — Therapy (Signed)
[place], [place], [address] Phone: [phone]   Fax:  [phone]  Physical Therapy Evaluation  Patient Details  Name: Jacqueline Woodard MRN: [id number] Date of Birth: 10-19-1985 Referring Provider (PT): Leeanne Rio, MD   Encounter Date: 07/23/2019  PT End of Session - 07/23/19 1010    Visit Number  1    Number of Visits  8    Date for PT Re-Evaluation  09/17/19    Authorization Type  BCBS    PT Start Time  0920    PT Stop Time  0958    PT Time Calculation (min)  38 min    Activity Tolerance  Patient tolerated treatment well    Behavior During Therapy  [facility] for tasks assessed/performed       Past Medical History:  Diagnosis Date  . Chronic migraine   . Endometrial polyp   . GERD (gastroesophageal reflux disease)   . Hereditary lymphedema of legs    chronic --- right > left  . History of acute renal failure    in setting cellulitis left lower leg 06/ 2008 and right lower leg cellulitis with sepsis  . History of borderline diabetes mellitus    08-29-2017 PER PT DX 2016 was checked again last year no longer pre-diabetic  . History of cellulitis    06/ 2008  left lower leg cellulitis:  12-05-2013  and 06-04-2014-- right lower leg cellulitis w/ sepsis  . History of Clostridium difficile infection 08/2006  . History of gastric ulcer 2015  . History of ovarian cyst   . History of sepsis    w/ right lower leg cellulitis 12-05-2013 and 06-04-2014  . Microcytic anemia   . Mild asthma    INHALER PRN-- LAST USED 2014  . Uterine fibroid   . Wears glasses     Past Surgical History:  Procedure Laterality Date  . BREAST REDUCTION SURGERY Bilateral 03/01/2013   Procedure: BILATERAL MAMMARY REDUCTION  (BREAST);  Surgeon: Cristine Polio, MD;  Location: [place];  Service: Plastics;  Laterality: Bilateral;  . BREAST SURGERY Bilateral 2015   Breast reduction  . DILATATION &  CURETTAGE/HYSTEROSCOPY WITH MYOSURE N/A 09/05/2017   Procedure: DILATATION & CURETTAGE/HYSTEROSCOPY WITH MYOSURE;  Surgeon: Anastasio Auerbach, MD;  Location: [place];  Service: Gynecology;  Laterality: N/A;  requests 7:30am OR time Requests one hour  . DILATION AND CURETTAGE OF UTERUS  09/05/2017   Hyperplastic uterus, uterine polyp removal, irregular bleeding  . WISDOM TOOTH EXTRACTION      There were no vitals filed for this visit.   Subjective Assessment - 07/23/19 0921    Subjective  Patient reports that she has swelling issues in her lower legs and for the past few months she has started having some right knee pain. She also notes she can feel popping when she straightens and bend hers knee that seems to been below the knee cap. The popping is not always painful but it can become painful. She denies any mechanism of injury. She feels limited in a lot of long distance walking. She also has pain if she has been sitting for a while and then goes to start walking.    Limitations  Writing;House hold activities    How long can you sit comfortably?  No limitation    How long can you stand comfortably?  2-3 hours    How long can you walk comfortably?  2-3 hours  Diagnostic tests  None    Patient Stated Goals  Improve pain so    Currently in Pain?  Yes    Pain Score  0-No pain   patient reports pain can get up to 10/10   Pain Location  Knee    Pain Orientation  Right    Pain Descriptors / Indicators  Aching    Pain Type  Chronic pain    Pain Onset  More than a month ago    Pain Frequency  Intermittent    Aggravating Factors   Walking, squatting, bending, stairs    Pain Relieving Factors  Tylenol, rest and elevation, voltaren gel    Effect of Pain on Daily Activities  Patient is limited in walking         [facility] PT Assessment - 07/23/19 0001      Assessment   Medical Diagnosis  Chronic pain of right knee    Referring Provider (PT)  Leeanne Rio, MD     Onset Date/Surgical Date  --   approximately 2 months   Hand Dominance  Right    Next MD Visit  Not scheduled    Prior Therapy  Yes - LBP      Precautions   Precautions  None      Restrictions   Weight Bearing Restrictions  No      Balance Screen   Has the patient fallen in the past 6 months  No    Has the patient had a decrease in activity level because of a fear of falling?   No    Is the patient reluctant to leave their home because of a fear of falling?   No      Home Environment   Living Environment  Private residence    Type of [place] Access  Level entry    Home Layout  Two level    Alternate Level Stairs-Number of Steps  13      Prior Function   Vocation  Full time employment    Vocation Requirements  [employer] - bending and getting up/down      Cognition   Overall Cognitive Status  Within Functional Limits for tasks assessed      Observation/Other Assessments   Observations  Patient appears in no apparent distress    Focus on Therapeutic Outcomes (FOTO)   47% limitation      Sensation   Light Touch  Appears Intact      Functional Tests   Functional tests  Sit to Stand;Single leg stance      Single Leg Stance   Comments  Patient able to maintain < 5 seconds bilaterally       Sit to Stand   Comments  Mild knee valgus bilaterally, report of mild right knee pain      Posture/Postural Control   Posture Comments  Rounded shoulder and forward head posture      ROM / Strength   AROM / PROM / Strength  PROM;AROM;Strength      AROM   AROM Assessment Site  Knee    Right/Left Knee  Right;Left    Right Knee Extension  0    Right Knee Flexion  130    Left Knee Extension  0    Left Knee Flexion  130      PROM   PROM Assessment Site  Knee    Right/Left Knee  Right;Left    Right Knee Extension  2  Right Knee Flexion  130    Left Knee Extension  2    Left Knee Flexion  130      Strength   Strength Assessment Site  Hip;Knee    Right/Left Hip   Right;Left    Right Hip Flexion  4-/5    Right Hip Extension  4-/5    Right Hip ABduction  4-/5    Left Hip Flexion  4/5    Left Hip Extension  4-/5    Left Hip ABduction  4-/5    Right/Left Knee  Right;Left    Right Knee Flexion  4-/5    Right Knee Extension  4-/5    Left Knee Flexion  4/5    Left Knee Extension  4/5      Flexibility   Soft Tissue Assessment /Muscle Length  yes    Hamstrings  WFL    Quadriceps  WFL, pain on right    ITB  Limited, pain on right      Palpation   Patella mobility  WFL, mildly uncomfortable on right    Palpation comment  Right knee peripatellar region      Special Tests   Other special tests  None performed      Transfers   Transfers  Independent with all Transfers      Ambulation/Gait   Ambulation/Gait  Yes    Ambulation/Gait Assistance  7: Independent    Gait Comments  Mildly antalgic on right, mild bilateral knee valgus, trendelenburg bilaterally                Objective measurements completed on examination: See above findings.      [place] Adult PT Treatment/Exercise - 07/23/19 0001      Exercises   Exercises  Knee/Hip      Knee/Hip Exercises: Stretches   Quad Stretch  2 reps;20 seconds    Quad Stretch Limitations  supine edge of mat with strap    ITB Stretch  2 reps;20 seconds    ITB Stretch Limitations  supine with strap      Knee/Hip Exercises: Supine   Bridges  10 reps   3 sec hold   Straight Leg Raises  10 reps      Knee/Hip Exercises: Sidelying   Hip ABduction  10 reps             PT Education - 07/23/19 0932    Education Details  Exam findings, POC, HEP    Person(s) Educated  Patient    Methods  Explanation;Demonstration;Tactile cues;Verbal cues;Handout    Comprehension  Verbalized understanding;Returned demonstration;Verbal cues required;Tactile cues required;Need further instruction       PT Short Term Goals - 07/23/19 1022      PT SHORT TERM GOAL #1   Title  Patient will be I with  initial HEP to progress in PT    Time  4    Period  Weeks    Status  New    Target Date  08/20/19      PT SHORT TERM GOAL #2   Title  Patient will be able to walk with no limitation in distance and </= 3/10 pain level to improve working ability    Time  4    Period  Weeks    Status  New    Target Date  08/20/19      PT SHORT TERM GOAL #3   Title  Patient will be able to perform light activities around home with no difficulty  Time  4    Period  Weeks    Status  New    Target Date  08/20/19        PT Long Term Goals - 07/23/19 1024      PT LONG TERM GOAL #1   Title  Patient will be I with final HEP to maintain improvement from PT    Time  8    Period  Weeks    Status  New    Target Date  09/17/19      PT LONG TERM GOAL #2   Title  Patient will report ability to negotiate 1 flight of stairs with little difficulty to improve home mobility    Time  8    Period  Weeks    Status  New    Target Date  09/17/19      PT LONG TERM GOAL #3   Title  Patient will be able to work a full shift and perform all bending/squatting tasks with </= 3/10 pain level and not requiring rest break    Time  8    Period  Weeks    Status  New    Target Date  09/17/19      PT LONG TERM GOAL #4   Title  Patient will be able to walk without limitation or pain to improve shopping ability    Time  8    Period  Weeks    Status  New    Target Date  09/17/19      PT LONG TERM GOAL #5   Title  Patient will report improved functional level of </= 30% limitation on FOTO    Time  8    Period  Weeks    Status  New    Target Date  09/17/19             Plan - 07/23/19 1012    Clinical Impression Statement  Patient presents to PT with report of chronic right knee pain that limits her ability to perform all working tasks and walking extended periods. Her current symptoms seem consistent with patellofemoral pain. She exhibits general weakness of the RLE compared to left, discomfort with deep  knee flexion and stairs/squatting tasks, and crepitus with squatting and open chain knee flexion/extension. She was provided with exercises to initiate stretching and strengthening to improve knee control and she would benefit from continued skilled PT to progress her strength and improve tolerance for walking and squatting tasks.    Personal Factors and Comorbidities  Time since onset of injury/illness/exacerbation;Fitness    Examination-Activity Limitations  Locomotion Level;Bend;Squat;Stand;Stairs;Lift    Examination-Participation Restrictions  Meal Prep;Cleaning;Community Activity;Shop;Yard Work    Stability/Clinical Decision Making  Stable/Uncomplicated    Clinical Decision Making  Low    Rehab Potential  Good    PT Frequency  1x / week    PT Duration  8 weeks    PT Treatment/Interventions  ADLs/Self Care Home Management;Cryotherapy;Electrical Stimulation;Iontophoresis 4mg /ml Dexamethasone;Moist Heat;Ultrasound;Neuromuscular re-education;Balance training;Therapeutic exercise;Therapeutic activities;Functional mobility training;Stair training;Gait training;Patient/family education;Manual techniques;Dry needling;Passive range of motion;Taping;Joint Manipulations    PT Next Visit Plan  Assess HEP and progress PRN, progress RLE strengthening with focus on quad and hip strength, incorporate squats/step-ups as able    PT Home Exercise Plan  MMGRCERR: SLR, bridge, sidelying hip abduction, supine hip flexor/quad stretch, supine ITB stretch with strap    Consulted and Agree with Plan of Care  Patient       Patient will benefit from skilled  therapeutic intervention in order to improve the following deficits and impairments:  Abnormal gait, Difficulty walking, Decreased activity tolerance, Pain, Improper body mechanics, Impaired flexibility, Decreased balance, Decreased strength, Increased edema, Postural dysfunction  Visit Diagnosis: Chronic pain of right knee  Muscle weakness (generalized)  Other  abnormalities of gait and mobility     Problem List Patient Active Problem List   Diagnosis Date Noted  . Generalized abdominal pain 02/17/2019  . Anxiety 01/20/2019  . Annual physical exam 11/24/2018  . Class 3 severe obesity with body mass index (BMI) of 45.0 to 49.9 in adult ([place]) 11/24/2018  . Mild intermittent asthma with acute exacerbation 01/28/2018  . Low back pain without sciatica 09/23/2017  . Plantar fasciitis of right foot 02/19/2017  . Endometrial thickening on ultrasound 02/19/2017  . Paronychia of finger of right hand 12/29/2016  . Bilateral leg edema 10/21/2011  . Chronic pain of right knee 10/21/2011  . Migraine 07/26/2006  . ASTHMA 07/26/2006  . HX, PERSONAL, EXPOSURE TO LEAD 07/26/2006    Hilda Blades, PT, DPT, LAT, ATC 07/23/19  10:31 AM Phone: [phone] Fax: [place] [facility] [address] [place], [place], [address] Phone: [phone]   Fax:  [phone]  Name: Jacqueline Woodard MRN: [id number] Date of Birth: 07-26-1985

## 2019-07-30 ENCOUNTER — Ambulatory Visit: Payer: BC Managed Care – PPO | Admitting: Rehabilitative and Restorative Service Providers"

## 2019-08-06 ENCOUNTER — Ambulatory Visit: Payer: BC Managed Care – PPO | Admitting: Rehabilitative and Restorative Service Providers"

## 2019-08-06 ENCOUNTER — Other Ambulatory Visit: Payer: Self-pay

## 2019-08-06 ENCOUNTER — Encounter: Payer: Self-pay | Admitting: Rehabilitative and Restorative Service Providers"

## 2019-08-06 DIAGNOSIS — R2689 Other abnormalities of gait and mobility: Secondary | ICD-10-CM | POA: Diagnosis not present

## 2019-08-06 DIAGNOSIS — R600 Localized edema: Secondary | ICD-10-CM | POA: Diagnosis not present

## 2019-08-06 DIAGNOSIS — M6281 Muscle weakness (generalized): Secondary | ICD-10-CM | POA: Diagnosis not present

## 2019-08-06 DIAGNOSIS — G8929 Other chronic pain: Secondary | ICD-10-CM | POA: Diagnosis not present

## 2019-08-06 DIAGNOSIS — M545 Low back pain: Secondary | ICD-10-CM | POA: Diagnosis not present

## 2019-08-06 DIAGNOSIS — M25561 Pain in right knee: Secondary | ICD-10-CM | POA: Diagnosis not present

## 2019-08-06 NOTE — Therapy (Signed)
[place], [place], [address] Phone: [phone]   Fax:  [phone]  Physical Therapy Treatment  Patient Details  Name: Jacqueline Woodard MRN: [id number] Date of Birth: January 16, 1986 Referring Provider (PT): Leeanne Rio, MD   Encounter Date: 08/06/2019  PT End of Session - 08/06/19 1002    Visit Number  2    Number of Visits  8    Date for PT Re-Evaluation  09/17/19    Authorization Type  BCBS    PT Start Time  0917    PT Stop Time  1002    PT Time Calculation (min)  45 min    Activity Tolerance  Patient tolerated treatment well    Behavior During Therapy  [facility] for tasks assessed/performed       Past Medical History:  Diagnosis Date  . Chronic migraine   . Endometrial polyp   . GERD (gastroesophageal reflux disease)   . Hereditary lymphedema of legs    chronic --- right > left  . History of acute renal failure    in setting cellulitis left lower leg 06/ 2008 and right lower leg cellulitis with sepsis  . History of borderline diabetes mellitus    08-29-2017 PER PT DX 2016 was checked again last year no longer pre-diabetic  . History of cellulitis    06/ 2008  left lower leg cellulitis:  12-05-2013  and 06-04-2014-- right lower leg cellulitis w/ sepsis  . History of Clostridium difficile infection 08/2006  . History of gastric ulcer 2015  . History of ovarian cyst   . History of sepsis    w/ right lower leg cellulitis 12-05-2013 and 06-04-2014  . Microcytic anemia   . Mild asthma    INHALER PRN-- LAST USED 2014  . Uterine fibroid   . Wears glasses     Past Surgical History:  Procedure Laterality Date  . BREAST REDUCTION SURGERY Bilateral 03/01/2013   Procedure: BILATERAL MAMMARY REDUCTION  (BREAST);  Surgeon: Cristine Polio, MD;  Location: [place];  Service: Plastics;  Laterality: Bilateral;  . BREAST SURGERY Bilateral 2015   Breast reduction  . DILATATION &  CURETTAGE/HYSTEROSCOPY WITH MYOSURE N/A 09/05/2017   Procedure: DILATATION & CURETTAGE/HYSTEROSCOPY WITH MYOSURE;  Surgeon: Anastasio Auerbach, MD;  Location: [place];  Service: Gynecology;  Laterality: N/A;  requests 7:30am OR time Requests one hour  . DILATION AND CURETTAGE OF UTERUS  09/05/2017   Hyperplastic uterus, uterine polyp removal, irregular bleeding  . WISDOM TOOTH EXTRACTION      There were no vitals filed for this visit.  Subjective Assessment - 08/06/19 0917    Subjective  Jacqueline Woodard reports inconsistent HEP compliance.    Limitations  Writing;House hold activities    How long can you sit comfortably?  No limitation    How long can you stand comfortably?  2-3 hours    How long can you walk comfortably?  2-3 hours    Diagnostic tests  None    Patient Stated Goals  Improve pain so    Pain Onset  More than a month ago                        [facility] Adult PT Treatment/Exercise - 08/06/19 0001      Exercises   Exercises  Knee/Hip      Knee/Hip Exercises: Stretches   Quad Stretch  4 reps;20 seconds    ITB Stretch  4 reps;20 seconds   Hip at 90, adduct and toe in (IR)     Knee/Hip Exercises: Aerobic   Nustep  8 minutes Level 5      Knee/Hip Exercises: Seated   Sit to Sand  2 sets;5 reps   Slow eccentrics     Knee/Hip Exercises: Supine   Bridges  10 reps   3 sec hold   Straight Leg Raises  3 sets;5 reps   Seated in chair with 4 pillows behind.     Knee/Hip Exercises: Sidelying   Hip ABduction  10 reps             PT Education - 08/06/19 1103    Education Details  Reviewed and corrected HEP.  Added seated straight leg raises and sit to stand.    Person(s) Educated  Patient    Methods  Explanation;Demonstration;Tactile cues;Verbal cues;Handout    Comprehension  Verbalized understanding;Tactile cues required;Need further instruction;Returned demonstration;Verbal cues required       PT Short Term Goals - 07/23/19 1022       PT SHORT TERM GOAL #1   Title  Patient will be I with initial HEP to progress in PT    Time  4    Period  Weeks    Status  New    Target Date  08/20/19      PT SHORT TERM GOAL #2   Title  Patient will be able to walk with no limitation in distance and </= 3/10 pain level to improve working ability    Time  4    Period  Weeks    Status  New    Target Date  08/20/19      PT SHORT TERM GOAL #3   Title  Patient will be able to perform light activities around home with no difficulty    Time  4    Period  Weeks    Status  New    Target Date  08/20/19        PT Long Term Goals - 07/23/19 1024      PT LONG TERM GOAL #1   Title  Patient will be I with final HEP to maintain improvement from PT    Time  8    Period  Weeks    Status  New    Target Date  09/17/19      PT LONG TERM GOAL #2   Title  Patient will report ability to negotiate 1 flight of stairs with little difficulty to improve home mobility    Time  8    Period  Weeks    Status  New    Target Date  09/17/19      PT LONG TERM GOAL #3   Title  Patient will be able to work a full shift and perform all bending/squatting tasks with </= 3/10 pain level and not requiring rest break    Time  8    Period  Weeks    Status  New    Target Date  09/17/19      PT LONG TERM GOAL #4   Title  Patient will be able to walk without limitation or pain to improve shopping ability    Time  8    Period  Weeks    Status  New    Target Date  09/17/19      PT LONG TERM GOAL #5   Title  Patient will report improved functional level of </= 30%  limitation on FOTO    Time  8    Period  Weeks    Status  New    Target Date  09/17/19            Plan - 08/06/19 1002    Clinical Impression Statement  Jacqueline Woodard reports inconsistent HEP compliance since evaluation.  We discussed the importance of addressing quadriceps weakness and thigh stiffness to address her R knee stiffness.  With consistent HEP compliance and attendance in  rehabilitation, her prognosis is good to meet established long-term goals.    Personal Factors and Comorbidities  Time since onset of injury/illness/exacerbation;Fitness    Examination-Activity Limitations  Locomotion Level;Bend;Squat;Stand;Stairs;Lift    Examination-Participation Restrictions  Meal Prep;Cleaning;Community Activity;Shop;Yard Work    Stability/Clinical Decision Making  Stable/Uncomplicated    Rehab Potential  Good    PT Frequency  1x / week    PT Duration  8 weeks    PT Treatment/Interventions  ADLs/Self Care Home Management;Cryotherapy;Electrical Stimulation;Iontophoresis 4mg /ml Dexamethasone;Moist Heat;Ultrasound;Neuromuscular re-education;Balance training;Therapeutic exercise;Therapeutic activities;Functional mobility training;Stair training;Gait training;Patient/family education;Manual techniques;Dry needling;Passive range of motion;Taping;Joint Manipulations    PT Next Visit Plan  Assess HEP and progress PRN, progress RLE strengthening with focus on quad and hip strength, incorporate squats/step-ups as able    PT Home Exercise Plan  MMGRCERR: SLR, bridge, sidelying hip abduction, supine hip flexor/quad stretch, supine ITB stretch with strap    Consulted and Agree with Plan of Care  Patient       Patient will benefit from skilled therapeutic intervention in order to improve the following deficits and impairments:  Abnormal gait, Difficulty walking, Decreased activity tolerance, Pain, Improper body mechanics, Impaired flexibility, Decreased balance, Decreased strength, Increased edema, Postural dysfunction  Visit Diagnosis: Chronic pain of right knee  Muscle weakness (generalized)  Other abnormalities of gait and mobility  Bilateral leg edema     Problem List Patient Active Problem List   Diagnosis Date Noted  . Generalized abdominal pain 02/17/2019  . Anxiety 01/20/2019  . Annual physical exam 11/24/2018  . Class 3 severe obesity with body mass index (BMI) of  45.0 to 49.9 in adult ([place]) 11/24/2018  . Mild intermittent asthma with acute exacerbation 01/28/2018  . Low back pain without sciatica 09/23/2017  . Plantar fasciitis of right foot 02/19/2017  . Endometrial thickening on ultrasound 02/19/2017  . Paronychia of finger of right hand 12/29/2016  . Bilateral leg edema 10/21/2011  . Chronic pain of right knee 10/21/2011  . Migraine 07/26/2006  . ASTHMA 07/26/2006  . HX, PERSONAL, EXPOSURE TO LEAD 07/26/2006    Farley Ly PT, MPT 08/06/2019, 11:09 AM  [facility] [address] [place], [place], [address] Phone: [phone]   Fax:  [phone]  Name: GLYNNA MOHAMUD MRN: [id number] Date of Birth: October 22, 1985

## 2019-08-06 NOTE — Patient Instructions (Signed)
Access Code: [facility] URL: [email or web address] Date: 08/06/2019 Prepared by: Vista Mink  Exercises Small Range Straight Leg Raise - 1 x daily - 7 x weekly - 6-10 sets - 5 reps - 3 secondsinutes hold Sit to Stand without Arm Support - 2 x daily - 7 x weekly - 1 sets - 5 reps

## 2019-08-13 ENCOUNTER — Other Ambulatory Visit: Payer: Self-pay

## 2019-08-13 ENCOUNTER — Ambulatory Visit: Payer: BC Managed Care – PPO | Admitting: Physical Therapy

## 2019-08-13 ENCOUNTER — Encounter: Payer: Self-pay | Admitting: Physical Therapy

## 2019-08-13 DIAGNOSIS — G8929 Other chronic pain: Secondary | ICD-10-CM | POA: Diagnosis not present

## 2019-08-13 DIAGNOSIS — R600 Localized edema: Secondary | ICD-10-CM | POA: Diagnosis not present

## 2019-08-13 DIAGNOSIS — M545 Low back pain, unspecified: Secondary | ICD-10-CM

## 2019-08-13 DIAGNOSIS — M25561 Pain in right knee: Secondary | ICD-10-CM | POA: Diagnosis not present

## 2019-08-13 DIAGNOSIS — R2689 Other abnormalities of gait and mobility: Secondary | ICD-10-CM

## 2019-08-13 DIAGNOSIS — M6281 Muscle weakness (generalized): Secondary | ICD-10-CM | POA: Diagnosis not present

## 2019-08-13 NOTE — Therapy (Addendum)
[place], [place], [address] Phone: [phone]   Fax:  [phone]  Physical Therapy Treatment / Discharge  Patient Details  Name: Jacqueline Woodard MRN: [id number] Date of Birth: 1985-09-13 Referring Provider (PT): Leeanne Rio, MD   Encounter Date: 08/13/2019  PT End of Session - 08/13/19 0927    Visit Number  3    Number of Visits  8    Date for PT Re-Evaluation  09/17/19    Authorization Type  BC[employer]    PT Start Time  0920    PT Stop Time  1000    PT Time Calculation (min)  40 min    Activity Tolerance  Patient tolerated treatment well    Behavior During Therapy  [facility] for tasks assessed/performed       Past Medical History:  Diagnosis Date  . Chronic migraine   . Endometrial polyp   . GERD (gastroesophageal reflux disease)   . Hereditary lymphedema of legs    chronic --- right > left  . History of acute renal failure    in setting cellulitis left lower leg 06/ 2008 and right lower leg cellulitis with sepsis  . History of borderline diabetes mellitus    08-29-2017 PER PT DX 2016 was checked again last year no longer pre-diabetic  . History of cellulitis    06/ 2008  left lower leg cellulitis:  12-05-2013  and 06-04-2014-- right lower leg cellulitis w/ sepsis  . History of Clostridium difficile infection 08/2006  . History of gastric ulcer 2015  . History of ovarian cyst   . History of sepsis    w/ right lower leg cellulitis 12-05-2013 and 06-04-2014  . Microcytic anemia   . Mild asthma    INHALER PRN-- LAST USED 2014  . Uterine fibroid   . Wears glasses     Past Surgical History:  Procedure Laterality Date  . BREAST REDUCTION SURGERY Bilateral 03/01/2013   Procedure: BILATERAL MAMMARY REDUCTION  (BREAST);  Surgeon: Cristine Polio, MD;  Location: [facility];  Service: Plastics;  Laterality: Bilateral;  . BREAST SURGERY Bilateral 2015   Breast reduction  .  DILATATION & CURETTAGE/HYSTEROSCOPY WITH MYOSURE N/A 09/05/2017   Procedure: DILATATION & CURETTAGE/HYSTEROSCOPY WITH MYOSURE;  Surgeon: Anastasio Auerbach, MD;  Location: [facility];  Service: Gynecology;  Laterality: N/A;  requests 7:30am OR time Requests one hour  . DILATION AND CURETTAGE OF UTERUS  09/05/2017   Hyperplastic uterus, uterine polyp removal, irregular bleeding  . WISDOM TOOTH EXTRACTION      There were no vitals filed for this visit.  Subjective Assessment - 08/13/19 0925    Subjective  Patient reports her knee has been a little better with less popping and pain.    Patient Stated Goals  Improve pain so    Currently in Pain?  No/denies         [facility] PT Assessment - 08/13/19 0001      AROM   Right Knee Extension  0    Right Knee Flexion  130    Left Knee Extension  0    Left Knee Flexion  130                    [facility] Adult PT Treatment/Exercise - 08/13/19 0001      Exercises   Exercises  Knee/Hip      Knee/Hip Exercises: Stretches   Quad Stretch  2 reps;20 seconds  Knee/Hip Exercises: Aerobic   Nustep  L5 x 5 min LE only      Knee/Hip Exercises: Standing   Forward Step Up  2 sets;10 reps    Forward Step Up Limitations  6" step height      Knee/Hip Exercises: Seated   Long Arc Quad  2 sets;10 reps    Long Arc Quad Limitations  2nd set with 1#    Other Seated Knee/Hip Exercises  SLR x5 each    Sit to [employer]  2 sets;5 reps      Knee/Hip Exercises: Supine   Bridges  10 reps   3 sec hold     Knee/Hip Exercises: Sidelying   Hip ABduction  2 sets;10 reps    Hip ABduction Limitations  right only, patient reported right hip discomfort when lying on right side             PT Education - 08/13/19 0927    Education Details  HEP    Person(s) Educated  Patient    Methods  Explanation;Demonstration;Verbal cues    Comprehension  Verbalized understanding;Returned demonstration;Verbal cues required;Need further  instruction       PT Short Term Goals - 08/13/19 1000      PT SHORT TERM GOAL #1   Title  Patient will be I with initial HEP to progress in PT    Time  4    Period  Weeks    Status  On-going    Target Date  08/20/19      PT SHORT TERM GOAL #2   Title  Patient will be able to walk with no limitation in distance and </= 3/10 pain level to improve working ability    Time  4    Period  Weeks    Status  On-going    Target Date  08/20/19      PT SHORT TERM GOAL #3   Title  Patient will be able to perform light activities around home with no difficulty    Time  4    Period  Weeks    Status  On-going    Target Date  08/20/19        PT Long Term Goals - 07/23/19 1024      PT LONG TERM GOAL #1   Title  Patient will be I with final HEP to maintain improvement from PT    Time  8    Period  Weeks    Status  New    Target Date  09/17/19      PT LONG TERM GOAL #2   Title  Patient will report ability to negotiate 1 flight of stairs with little difficulty to improve home mobility    Time  8    Period  Weeks    Status  New    Target Date  09/17/19      PT LONG TERM GOAL #3   Title  Patient will be able to work a full shift and perform all bending/squatting tasks with </= 3/10 pain level and not requiring rest break    Time  8    Period  Weeks    Status  New    Target Date  09/17/19      PT LONG TERM GOAL #4   Title  Patient will be able to walk without limitation or pain to improve shopping ability    Time  8    Period  Weeks    Status  New  Target Date  09/17/19      PT LONG TERM GOAL #5   Title  Patient will report improved functional level of </= 30% limitation on FOTO    Time  8    Period  Weeks    Status  New    Target Date  09/17/19            Plan - 08/13/19 0929    Clinical Impression Statement  Patient tolerated therapy well with no adverse effects. Continues progression of strengthening  for LE with good tolerance. Added step-ups this visit without  any increase in pain. Depending on how patient feels next visit possible discahrge with HEP as she reports good improvement. She would benefit from continued skilled PT to progress her strength and improve tolerance for walking and squatting tasks.    PT Treatment/Interventions  ADLs/Self Care Home Management;Cryotherapy;Electrical Stimulation;Iontophoresis 19m/ml Dexamethasone;Moist Heat;Ultrasound;Neuromuscular re-education;Balance training;Therapeutic exercise;Therapeutic activities;Functional mobility training;Stair training;Gait training;Patient/family education;Manual techniques;Dry needling;Passive range of motion;Taping;Joint Manipulations    PT Next Visit Plan  Assess HEP and progress PRN, progress RLE strengthening with focus on quad and hip strength, incorporate squats/step-ups as able    PT Home Exercise Plan  MMGRCERR: SLR (supine and seated), bridge, sidelying hip abduction, supine hip flexor/quad stretch, supine ITB stretch with strap, LAQ, sit<>stand    Consulted and Agree with Plan of Care  Patient       Patient will benefit from skilled therapeutic intervention in order to improve the following deficits and impairments:  Abnormal gait, Difficulty walking, Decreased activity tolerance, Pain, Improper body mechanics, Impaired flexibility, Decreased balance, Decreased strength, Increased edema, Postural dysfunction  Visit Diagnosis: Chronic pain of right knee  Muscle weakness (generalized)  Other abnormalities of gait and mobility  Bilateral leg edema  Chronic bilateral low back pain without sciatica     Problem List Patient Active Problem List   Diagnosis Date Noted  . Generalized abdominal pain 02/17/2019  . Anxiety 01/20/2019  . Annual physical exam 11/24/2018  . Class 3 severe obesity with body mass index (BMI) of 45.0 to 49.9 in [place] 11/24/2018  . Mild intermittent asthma with acute exacerbation 01/28/2018  . Low back pain without sciatica 09/23/2017  .  Plantar fasciitis of right foot 02/19/2017  . Endometrial thickening on ultrasound 02/19/2017  . Paronychia of finger of right hand 12/29/2016  . Bilateral leg edema 10/21/2011  . Chronic pain of right knee 10/21/2011  . Migraine 07/26/2006  . ASTHMA 07/26/2006  . HX, PERSONAL, EXPOSURE TO LEAD 07/26/2006    CHilda Blades PT, DPT, LAT, ATC 08/13/19  10:04 AM Phone: [phone]: 3[facility] [address]GFred[place] [address]Phone: [phone]  Fax:  [phone] Name: Jacqueline FARIASMRN: [id number] of Birth: 1Oct 30, 1987   PHYSICAL THERAPY DISCHARGE SUMMARY  Visits from Start of Care: 3  Current functional level related to goals / functional outcomes: See above   Remaining deficits: See above   Education / Equipment: HEP Plan: Patient agrees to discharge.  Patient goals were not met. Patient is being discharged due to not returning since the last visit.  ?????    CHilda Blades PT, DPT, LAT, ATC 08/24/19  3:03 PM Phone: [phone]: [phone]

## 2019-08-20 ENCOUNTER — Ambulatory Visit: Payer: BC Managed Care – PPO | Attending: Family Medicine

## 2019-09-08 ENCOUNTER — Ambulatory Visit: Payer: BC Managed Care – PPO | Attending: Internal Medicine

## 2019-09-08 DIAGNOSIS — Z20822 Contact with and (suspected) exposure to covid-19: Secondary | ICD-10-CM | POA: Diagnosis not present

## 2019-09-09 LAB — SARS-COV-2, NAA 2 DAY TAT

## 2019-09-09 LAB — NOVEL CORONAVIRUS, NAA: SARS-CoV-2, NAA: DETECTED — AB

## 2019-09-10 ENCOUNTER — Telehealth: Payer: Self-pay | Admitting: Physician Assistant

## 2019-09-10 NOTE — Telephone Encounter (Signed)
Appears that patient has been contacted regarding this.  Appreciate assistance.

## 2019-09-10 NOTE — Telephone Encounter (Signed)
Called to discuss with patient about Covid symptoms and the use of bamlanivimab/etesevimab or casirivimab/imdevimab, a monoclonal antibody infusion for those with mild to moderate Covid symptoms and at a high risk of hospitalization.  Pt is qualified for this infusion at the [facility] infusion center due to BMI >25 and asthma.    Message left to call back and sent MyChart message.  Angelena Form PA-C  [facility]

## 2019-09-11 ENCOUNTER — Telehealth: Payer: Self-pay | Admitting: Adult Health

## 2019-09-11 NOTE — Telephone Encounter (Signed)
Called patient and LMOM to discuss monoclonal antibody therapy for treatment of COVID19.  She meets criteria for treatment based on her BMI greater than 25 and her asthma.  Call back number at [phone] given.  Wilber Bihari, NP

## 2019-09-30 ENCOUNTER — Other Ambulatory Visit: Payer: Self-pay

## 2019-09-30 MED ORDER — VENLAFAXINE HCL ER 75 MG PO CP24: 75.0000 mg | ORAL_CAPSULE | Freq: Every day | ORAL | 1 refills | Status: DC

## 2019-11-15 ENCOUNTER — Ambulatory Visit: Payer: BC Managed Care – PPO | Admitting: Family Medicine

## 2019-11-15 NOTE — Progress Notes (Deleted)
° ° °  SUBJECTIVE:   CHIEF COMPLAINT / HPI:   Migraines Has been having migraines *** Previously used zofran for nausea associated with migraines and imitrex ***  PERTINENT  PMH / PSH: ***  OBJECTIVE:   There were no vitals taken for this visit.  ***  ASSESSMENT/PLAN:   No problem-specific Assessment & Plan notes found for this encounter.     Cleophas Dunker, [place]

## 2019-11-29 ENCOUNTER — Other Ambulatory Visit: Payer: Self-pay

## 2019-11-29 ENCOUNTER — Encounter: Payer: Self-pay | Admitting: Family Medicine

## 2019-11-29 ENCOUNTER — Ambulatory Visit: Payer: BC Managed Care – PPO | Admitting: Family Medicine

## 2019-11-29 DIAGNOSIS — G43709 Chronic migraine without aura, not intractable, without status migrainosus: Secondary | ICD-10-CM

## 2019-11-29 MED ORDER — TOPIRAMATE 25 MG PO TABS: 25.0000 mg | ORAL_TABLET | Freq: Every day | ORAL | 0 refills | Status: DC

## 2019-11-29 MED ORDER — SUMATRIPTAN SUCCINATE 50 MG PO TABS: 50.0000 mg | ORAL_TABLET | ORAL | 1 refills | Status: DC | PRN

## 2019-11-29 NOTE — Assessment & Plan Note (Signed)
Migraines not currently well controlled, patient would likely benefit from controller.  Neurologically intact on examination which is reassuring.  Given that she is already on Effexor, would not choose SSRI.  Will start with Topamax 25 mg daily x1 week, then increase to twice daily.  Can continue to increase if needed.  Discussed with patient that she does not have improvement with this controller, will consider referral to neurology headache specialist.  Can continue to use Imitrex as needed, does not use more than 3 times in 24 hours.  We will also obtain BMP with starting of Topamax.  She is to return in 1 month to reassess symptoms and decide if further titrations needed.

## 2019-11-29 NOTE — Progress Notes (Signed)
    SUBJECTIVE:   CHIEF COMPLAINT / HPI:   Migraines Patient last seen on 07/09/2019 She was taking Zofran for nausea at that time, still using She has been prescribed Imitrex previously and has used this but doesn't want to use it very much because it makes her cleepy For the last month, she has been waking up with headaches, going to sleep with headaches Trying tylenol and not helping Sometimes has nausea with headaches, photophobia, phonophobia Sometimes pain is whole head, sometimes in the back of the head Sleep hasn't been very good, sometimes feels tired the day Sometimes snores at night, but not all the time Headaches are very random with timing Had these types of headaches before, but now more frequent Not currently sexually active, not on birth control, no chance of pregnancy No increase in stress recently Sometimes vision is blurry with her headaches  PERTINENT  PMH / PSH: Migraine, asthma, obesity  OBJECTIVE:   BP 120/84   Pulse 85   Wt 211 lb 6 oz (95.9 kg)   SpO2 96%   BMI 39.94 kg/m    Physical Exam:  General: 34 y.o. female in NAD Lungs: Breathing comfortably on room air Skin: warm and dry Extremities: 5/5 strength BUE/BLE Neuro: CN II through XII grossly intact, sensation intact in BUE/BLE  ASSESSMENT/PLAN:   Migraine Migraines not currently well controlled, patient would likely benefit from controller.  Neurologically intact on examination which is reassuring.  Given that she is already on Effexor, would not choose SSRI.  Will start with Topamax 25 mg daily x1 week, then increase to twice daily.  Can continue to increase if needed.  Discussed with patient that she does not have improvement with this controller, will consider referral to neurology headache specialist.  Can continue to use Imitrex as needed, does not use more than 3 times in 24 hours.  We will also obtain BMP with starting of Topamax.  She is to return in 1 month to reassess symptoms and  decide if further titrations needed.      Cleophas Dunker, [place]

## 2019-11-29 NOTE — Patient Instructions (Signed)
Thank you for coming to see me today. It was a pleasure. Today we talked about:   We will get some labs today.  If they are abnormal or we need to do something about them, I will call you.  If they are normal, I will send you a message on MyChart (if it is active) or a letter in the mail.  If you don't hear from [place] in 2 weeks, please call the office at the number below.  You will start topamax 25mg  once daily for 1 week, then increase to twice daily.  You can still use imitrex and tylenol as needed.  Don't take more than 3 imitrex in 1 day.  Please follow-up with me in 1 month.  If you have any questions or concerns, please do not hesitate to call the office at [phone].  Best,   Arizona Constable, DO

## 2019-11-30 LAB — BASIC METABOLIC PANEL
BUN/Creatinine Ratio: 13 (ref 9–23)
BUN: 10 mg/dL (ref 6–20)
CO2: 26 mmol/L (ref 20–29)
Calcium: 8.8 mg/dL (ref 8.7–10.2)
Chloride: 101 mmol/L (ref 96–106)
Creatinine, Ser: 0.77 mg/dL (ref 0.57–1.00)
GFR calc Af Amer: 116 mL/min/{1.73_m2} (ref >59)
GFR calc non Af Amer: 100 mL/min/{1.73_m2} (ref >59)
Glucose: 70 mg/dL (ref 65–99)
Potassium: 3.9 mmol/L (ref 3.5–5.2)
Sodium: 139 mmol/L (ref 134–144)

## 2019-12-22 ENCOUNTER — Other Ambulatory Visit: Payer: Self-pay | Admitting: *Deleted

## 2019-12-22 DIAGNOSIS — G43709 Chronic migraine without aura, not intractable, without status migrainosus: Secondary | ICD-10-CM

## 2019-12-22 MED ORDER — TOPIRAMATE 25 MG PO TABS: 25.0000 mg | ORAL_TABLET | Freq: Two times a day (BID) | ORAL | 1 refills | Status: DC

## 2019-12-29 ENCOUNTER — Ambulatory Visit (INDEPENDENT_AMBULATORY_CARE_PROVIDER_SITE_OTHER): Payer: BC Managed Care – PPO | Admitting: Family Medicine

## 2019-12-29 ENCOUNTER — Other Ambulatory Visit: Payer: Self-pay

## 2019-12-29 ENCOUNTER — Encounter: Payer: Self-pay | Admitting: Family Medicine

## 2019-12-29 VITALS — BP 130/94 | HR 99 | Ht 61.0 in | Wt 209.4 lb

## 2019-12-29 DIAGNOSIS — G43709 Chronic migraine without aura, not intractable, without status migrainosus: Secondary | ICD-10-CM

## 2019-12-29 MED ORDER — SUMATRIPTAN SUCCINATE 50 MG PO TABS: 50.0000 mg | ORAL_TABLET | ORAL | 1 refills | Status: DC | PRN

## 2019-12-29 NOTE — Assessment & Plan Note (Signed)
Improving with Topamax 25 mg twice daily.  We will keep her at this dose currently.  We will go ahead and check a BMP to ensure that creatinine and bicarb are stable.  Refill provided for Imitrex to use as needed.  We will have her follow-up in 3 months to ensure that she is still having improvement.

## 2019-12-29 NOTE — Patient Instructions (Signed)
Thank you for coming to see me today. It was a pleasure. Today we talked about:   I'm so glad that your medication is helping.    We will get some labs today.  If they are abnormal or we need to do something about them, I will call you.  If they are normal, I will send you a message on MyChart (if it is active) or a letter in the mail.  If you don't hear from [place] in 2 weeks, please call the office at the number below.   Please follow-up with me in 3 months or sooner as needed.  If you have any questions or concerns, please do not hesitate to call the office at [phone].  Best,   Arizona Constable, DO

## 2019-12-29 NOTE — Progress Notes (Signed)
    SUBJECTIVE:   CHIEF COMPLAINT / HPI:   Migraine follow-up Patient seen on 9/13 for worsening migraines She has been taking Imitrex, reported that it was making her sleepy Stated that she has been waking up and going to sleep with headaches for over a month Prescribed Topamax 25 mg daily for 1 week, then advised to increase to twice daily She is doing very well, in the last month has had about 3-4 headaches She has been taking Topamax 25 mg twice daily every day She is still using Imitrex as needed and states that it does help with her headaches now She would like to stay on her current dose  COVID vaccine, got her first shot 10 days ago  PERTINENT  PMH / PSH: Migraine, asthma, anxiety  OBJECTIVE:   BP (!) 130/94   Pulse 99   Ht 5\' 1"  (1.549 m)   Wt 209 lb 6.4 oz (95 kg)   LMP 12/19/2019 (Exact Date)   SpO2 100%   BMI 39.57 kg/m    Physical Exam:  General: 34 y.o. female in NAD Lungs: Breathing comfortably on room air Skin: warm and dry Extremities: Ambulating without difficulty   ASSESSMENT/PLAN:   Migraine Improving with Topamax 25 mg twice daily.  We will keep her at this dose currently.  We will go ahead and check a BMP to ensure that creatinine and bicarb are stable.  Refill provided for Imitrex to use as needed.  We will have her follow-up in 3 months to ensure that she is still having improvement.     Cleophas Dunker, [place]

## 2019-12-30 ENCOUNTER — Ambulatory Visit: Payer: BC Managed Care – PPO | Admitting: Family Medicine

## 2019-12-30 ENCOUNTER — Other Ambulatory Visit: Payer: Self-pay

## 2019-12-30 ENCOUNTER — Encounter: Payer: Self-pay | Admitting: Family Medicine

## 2019-12-30 VITALS — BP 128/86 | HR 88 | Ht 61.0 in | Wt 210.8 lb

## 2019-12-30 DIAGNOSIS — L600 Ingrowing nail: Secondary | ICD-10-CM | POA: Diagnosis not present

## 2019-12-30 DIAGNOSIS — B351 Tinea unguium: Secondary | ICD-10-CM | POA: Diagnosis not present

## 2019-12-30 LAB — BASIC METABOLIC PANEL
BUN/Creatinine Ratio: 9 (ref 9–23)
BUN: 9 mg/dL (ref 6–20)
CO2: 22 mmol/L (ref 20–29)
Calcium: 9 mg/dL (ref 8.7–10.2)
Chloride: 108 mmol/L — ABNORMAL HIGH (ref 96–106)
Creatinine, Ser: 0.97 mg/dL (ref 0.57–1.00)
GFR calc Af Amer: 88 mL/min/{1.73_m2} (ref >59)
GFR calc non Af Amer: 76 mL/min/{1.73_m2} (ref >59)
Glucose: 91 mg/dL (ref 65–99)
Potassium: 3.6 mmol/L (ref 3.5–5.2)
Sodium: 140 mmol/L (ref 134–144)

## 2019-12-30 MED ORDER — TERBINAFINE HCL 250 MG PO TABS: 250.0000 mg | ORAL_TABLET | Freq: Every day | ORAL | 0 refills | Status: AC

## 2019-12-30 NOTE — Progress Notes (Signed)
SUBJECTIVE:   CHIEF COMPLAINT / HPI:   Bilateral great toenail fungus and pain Patient reports that over the last few months, she has noticed thickening and discoloration of bilateral great toenails mostly, as well as a few other of her toes She reports that she has significant pain on the corners of these toes as she has noticed that they have started to curl in as they have thickened She states that she never had problems with ingrown toenails prior to having the thickening of nails in the last few months She has tried to massage and release the areas from the sides, but has not had improvement She is wanting to have her bilateral great toenails removed today  PERTINENT  PMH / PSH: Migraine, history of chronic leg edema, asthma, obesity, anxiety  OBJECTIVE:   BP 128/86   Pulse 88   Ht 5\' 1"  (1.549 m)   Wt 210 lb 12.8 oz (95.6 kg)   LMP 12/19/2019 (Exact Date)   SpO2 100%   BMI 39.83 kg/m    Physical Exam:  General: 34 y.o. female in NAD Lungs: Breathing comfortably on room air Skin: warm and dry Feet exam: Thickened, darkened bilateral great toenails, left greater than right, impingement of skin on medial and lateral aspects of bilateral great toenails, no surrounding erythema or signs of infection  PROCEDURE:  Right Great Toenail Removal   The area surrounding the skin lesion was prepared and draped in the  usual sterile manner. The patient is placed in the supine position, with  the knees flexed and feet placed on table.   The right great toe was prepped with alcohol swabs in the usual fashion. A standard digital  block was performed, using a 10-mL syringe and a 27-gauge needle, using 82mL of lidocaine without epinephrine.   A tourniquet was applied for the shortest time possible.   The toe was rewashed with betadine solution.   A nail elevator was slid under the cuticle to separate the nail plate from the overlying proximal nail fold.  This was repeated until the entire  nail was separated from the underlying nailbed.  Hemostats were used to provide traction and remove nail.  Blood loss was minimal.  Drysol was used to assist with hemostasis while pressure was also applied.  Bacitracin was placed over the nailbed and a gauze bandage was placed.  Patient tolerated the procedure well without complication.   Left Great Toenail Removal Patient remained in position above.   The left great toe was prepped with alcohol swabs in the usual fashion. A standard digital  block was performed, using a 10-mL syringe and a 27-gauge needle, using 54mL of lidocaine without epinephrine.   A tourniquet was applied for the shortest time possible.   The toe was rewashed with betadine solution.   A nail elevator was slid under the cuticle to separate the nail plate from the overlying proximal nail fold.  This was repeated until the entire nail was separated from the underlying nailbed.  Hemostats were used to provide traction and remove nail.  Blood loss was minimal.  Drysol was used to assist with hemostasis while pressure was also applied.  Bacitracin was placed over the nailbed and a gauze bandage was placed.  Patient tolerated the procedure well without complication.     ASSESSMENT/PLAN:   Ingrown toenail Discussed with patient that given that she has bilateral medial and lateral ingrown toenails that have presented since she has had thickening and likely onychomycosis,  could consider removing both toenails and treating for onychomycosis.  Advised the patient that a partial avulsion would likely not be helpful in this case given that she would continue to have onychomycosis and would be difficult to remove on both medial and lateral portions of great toenail.  Offered watchful waiting, treatment of onychomycosis, referral to podiatry, applying TXA to nail matrix with loss of toenails, or removal of toenails without destruction of matrix with treatment of onychomycosis in hopes that when  normal nail grows back that she will no longer have ingrown toenails.  She opted for removal with antifungal treatment.  Advised that could not guarantee that toenail would grow back, could also not guarantee that if toenail does grow back it will be normal and not ingrown.  Chances are high that toenail will not be ingrown given that symptoms have only started when onychomycosis started, but cannot be for sure.  She understood the risks.  Consent was obtained.  She had bilateral toenails removed per procedure note above.  She tolerated the procedure well.  The wounds were dressed in usual fashion.  She was advised to follow-up if she has symptoms of infection and was given these on her after visit summary.  She was advised to keep the area clean and dry as much as possible and that she will have reepithelialization in the next 7 to 10 days.  Onychomycosis Will obtain baseline CMP and use terbinafine for 12 weeks.  Plan to repeat CMP in 6 weeks and patient will return for f/u at that time.  Lab was closed by the end of procedure, therefore future order was placed for patient's CMP and she will come back for this.     Cleophas Dunker, [place]

## 2019-12-30 NOTE — Patient Instructions (Signed)
Thank you for coming to see me today. It was a pleasure. Today we talked about:   We removed both of your toenails today.  Monitor for signs of infection.  Signs of infection include redness, increased pain, pus, fever.  If you have any of these, you should be seen right away.  Keep the areas clean and covered.  The pain should improve after some healing occurs.  Continue to take the antifungal medication for 12 weeks.  Please follow-up with me in 6 weeks to recheck your liver function and see how your feet are doing.  If you have any questions or concerns, please do not hesitate to call the office at [phone].  Best,   Ari[place] Constable, DO

## 2019-12-31 DIAGNOSIS — B351 Tinea unguium: Secondary | ICD-10-CM | POA: Insufficient documentation

## 2019-12-31 DIAGNOSIS — L600 Ingrowing nail: Secondary | ICD-10-CM | POA: Insufficient documentation

## 2019-12-31 NOTE — Assessment & Plan Note (Signed)
Will obtain baseline CMP and use terbinafine for 12 weeks.  Plan to repeat CMP in 6 weeks and patient will return for f/u at that time.  Lab was closed by the end of procedure, therefore future order was placed for patient's CMP and she will come back for this.

## 2019-12-31 NOTE — Assessment & Plan Note (Signed)
Discussed with patient that given that she has bilateral medial and lateral ingrown toenails that have presented since she has had thickening and likely onychomycosis, could consider removing both toenails and treating for onychomycosis.  Advised the patient that a partial avulsion would likely not be helpful in this case given that she would continue to have onychomycosis and would be difficult to remove on both medial and lateral portions of great toenail.  Offered watchful waiting, treatment of onychomycosis, referral to podiatry, applying TXA to nail matrix with loss of toenails, or removal of toenails without destruction of matrix with treatment of onychomycosis in hopes that when normal nail grows back that she will no longer have ingrown toenails.  She opted for removal with antifungal treatment.  Advised that could not guarantee that toenail would grow back, could also not guarantee that if toenail does grow back it will be normal and not ingrown.  Chances are high that toenail will not be ingrown given that symptoms have only started when onychomycosis started, but cannot be for sure.  She understood the risks.  Consent was obtained.  She had bilateral toenails removed per procedure note above.  She tolerated the procedure well.  The wounds were dressed in usual fashion.  She was advised to follow-up if she has symptoms of infection and was given these on her after visit summary.  She was advised to keep the area clean and dry as much as possible and that she will have reepithelialization in the next 7 to 10 days.

## 2020-01-04 ENCOUNTER — Other Ambulatory Visit: Payer: Self-pay

## 2020-01-04 ENCOUNTER — Other Ambulatory Visit: Payer: BC Managed Care – PPO

## 2020-01-04 DIAGNOSIS — B351 Tinea unguium: Secondary | ICD-10-CM

## 2020-01-05 LAB — COMPREHENSIVE METABOLIC PANEL
ALT: 5 IU/L (ref 0–32)
AST: 11 IU/L (ref 0–40)
Albumin/Globulin Ratio: 1.4 (ref 1.2–2.2)
Albumin: 3.9 g/dL (ref 3.8–4.8)
Alkaline Phosphatase: 127 IU/L — ABNORMAL HIGH (ref 44–121)
BUN/Creatinine Ratio: 12 (ref 9–23)
BUN: 11 mg/dL (ref 6–20)
Bilirubin Total: <0.2 mg/dL (ref 0.0–1.2)
CO2: 20 mmol/L (ref 20–29)
Calcium: 8.3 mg/dL — ABNORMAL LOW (ref 8.7–10.2)
Chloride: 106 mmol/L (ref 96–106)
Creatinine, Ser: 0.91 mg/dL (ref 0.57–1.00)
GFR calc Af Amer: 95 mL/min/{1.73_m2} (ref >59)
GFR calc non Af Amer: 82 mL/min/{1.73_m2} (ref >59)
Globulin, Total: 2.7 g/dL (ref 1.5–4.5)
Glucose: 104 mg/dL — ABNORMAL HIGH (ref 65–99)
Potassium: 3.8 mmol/L (ref 3.5–5.2)
Sodium: 138 mmol/L (ref 134–144)
Total Protein: 6.6 g/dL (ref 6.0–8.5)

## 2020-01-18 ENCOUNTER — Encounter: Payer: Self-pay | Admitting: Family Medicine

## 2020-01-25 ENCOUNTER — Other Ambulatory Visit: Payer: Self-pay | Admitting: *Deleted

## 2020-01-25 DIAGNOSIS — G43709 Chronic migraine without aura, not intractable, without status migrainosus: Secondary | ICD-10-CM

## 2020-01-25 MED ORDER — TOPIRAMATE 25 MG PO TABS: 25.0000 mg | ORAL_TABLET | Freq: Two times a day (BID) | ORAL | 2 refills | Status: DC

## 2020-01-29 NOTE — Progress Notes (Deleted)
    SUBJECTIVE:   CHIEF COMPLAINT / HPI:   S/p bilateral toenail removal, f/u onychomycosis Patient had bilateral great toenails removed on 10/14 She has been taking terbinafine, started on 10/14 CMP was stable at the time of starting She has been on treatment for 4 weeks ***    PERTINENT  PMH / PSH: Migraine, asthma, anxiety  OBJECTIVE:   There were no vitals taken for this visit.   Physical Exam: *** General: 34 y.o. female in NAD Cardio: RRR no m/r/g Lungs: CTAB, no wheezing, no rhonchi, no crackles, no IWOB on *** Abdomen: Soft, non-tender to palpation, non-distended, positive bowel sounds Skin: warm and dry Extremities: No edema   ASSESSMENT/PLAN:   No problem-specific Assessment & Plan notes found for this encounter.     Cleophas Dunker, [place]

## 2020-01-31 ENCOUNTER — Ambulatory Visit: Payer: BC Managed Care – PPO | Admitting: Family Medicine

## 2020-02-16 ENCOUNTER — Ambulatory Visit: Payer: BC Managed Care – PPO | Admitting: Family Medicine

## 2020-02-25 ENCOUNTER — Ambulatory Visit: Payer: BC Managed Care – PPO | Admitting: Family Medicine

## 2020-02-28 ENCOUNTER — Ambulatory Visit: Payer: BC Managed Care – PPO | Admitting: Student in an Organized Health Care Education/Training Program

## 2020-02-28 ENCOUNTER — Other Ambulatory Visit: Payer: Self-pay

## 2020-02-28 VITALS — BP 125/90 | HR 89 | Ht 61.0 in | Wt 204.6 lb

## 2020-02-28 DIAGNOSIS — B351 Tinea unguium: Secondary | ICD-10-CM

## 2020-02-28 DIAGNOSIS — R5383 Other fatigue: Secondary | ICD-10-CM | POA: Diagnosis not present

## 2020-02-28 DIAGNOSIS — F3289 Other specified depressive episodes: Secondary | ICD-10-CM | POA: Diagnosis not present

## 2020-02-28 DIAGNOSIS — F32A Depression, unspecified: Secondary | ICD-10-CM

## 2020-02-28 DIAGNOSIS — F418 Other specified anxiety disorders: Secondary | ICD-10-CM

## 2020-02-28 NOTE — Assessment & Plan Note (Addendum)
Increased anxiety/depression for the past several weeks without a known trigger or change in her life. New medication is terbinafine in that time which does not have known interactions with her depression treatment. - continue venlafaxine and topamax - given resources for counseling which patient is agreeable to at this time - referral to psychiatry for further medication management - collected labs today to r/o other mimicing disorders such as anemia, thyroid disorder, vitamin deficiencies, etc - follow up in 4 weeks or sooner if worsening. Would recommend repeat PHQ9 as well as GAD at f/u

## 2020-02-28 NOTE — Patient Instructions (Addendum)
It was a pleasure to see you today!  To summarize our discussion for this visit:  Your toenails seem to be growing back as would be expected for this amount of time since their removal. Continue taking your medication  We are going to get some blood work today to help rule out some causes of symptoms that can mimic depression. To help treat your symptoms, I would recommend a combination of counseling and medication.  For counseling resources: psychologytoday.com  You are already on a couple of medications to help treat depression so I would recommend at this time seeking specialized care from a psychiatrist. I have sent in a referral for that.  If your feelings are not improving in a few weeks, we can consider  Some additional health maintenance measures we should update are: Health Maintenance Due  Topic Date Due  . TETANUS/TDAP  Never done  .    Call the clinic at [phone] if your symptoms worsen or you have any concerns.   Thank you for allowing me to take part in your care,  Dr. Doristine Mango  10 LITTLE Things To Do When You're Feeling Too Down To Do Anything  Take a shower. Even if you plan to stay in all day long and not see a soul, take a shower. It takes the most effort to hop in to the shower but once you do, you'll feel immediate results. It will wake you up and you'll be feeling much fresher (and cleaner too).  Brush and floss your teeth. Give your teeth a good brushing with a floss finish. It's a small task but it feels so good and you can check 'taking care of your health' off the list of things to do.  Do something small on your list. Most of [place] have some small thing we would like to get done (load of laundry, sew a button, email a friend). Doing one of these things will make you feel like you've accomplished something.  Drink water. Drinking water is easy right? It's also really beneficial for your health so keep a glass beside you all day and take sips  often. It gives you energy and prevents you from boredom eating.  Do some floor exercises. The last thing you want to do is exercise but it might be just the thing you need the most. Keep it simple and do exercises that involve sitting or laying on the floor. Even the smallest of exercises release chemicals in the brain that make you feel good. Yoga stretches or core exercises are going to make you feel good with minimal effort.  Make your bed. Making your bed takes a few minutes but it's productive and you'll feel relieved when it's done. An unmade bed is a huge visual reminder that you're having an unproductive day. Do it and consider it your housework for the day.  Put on some nice clothes. Take the sweatpants off even if you don't plan to go anywhere. Put on clothes that make you feel good. Take a look in the mirror so your brain recognizes the sweatpants have been replaced with clothes that make you look great. It's an instant confidence booster.  Wash the dishes. A pile of dirty dishes in the sink is a reflection of your mood. It's possible that if you wash up the dishes, your mood will follow suit. It's worth a try.  Cook a real meal. If you have the luxury to have a "do nothing" day, you have time  to make a real meal for yourself. Make a meal that you love to eat. The process is good to get you out of the funk and the food will ensure you have more energy for tomorrow.  Write out your thoughts by hand. When you hand write, you stimulate your brain to focus on the moment that you're in so make yourself comfortable and write whatever comes into your mind. Put those thoughts out on paper so they stop spinning around in your head. Those thoughts might be the very thing holding you down.

## 2020-02-28 NOTE — Progress Notes (Signed)
° °  SUBJECTIVE:   CHIEF COMPLAINT / HPI: f/u toenail removal, depression  Toenail removal f/u- patient has been taking terbinafine consistently. Has no pain in her great toes any more. Bruising has resolved. Is concerned about the rate of regrowth.  Depression- patient currently on venlafaxine and topomax. Has not had any changes or increased stress in her work/home life but has noticed that she is moved to tears more easily than in the past. Endorses decreased appetite, sleep disturbances, decreased motivation. Denies SI, moving or speaking slower. She has had 6lb weight loss since last visit ~16months.   OBJECTIVE:   BP 125/90    Pulse 89    Ht 5\' 1"  (1.549 m)    Wt 204 lb 9.6 oz (92.8 kg)    SpO2 100%    BMI 38.66 kg/m   General: NAD, pleasant, able to participate in exam Feet: toes nails still yellowed and flaky. Nail beds of great toes are non-erythematous and non-painful to compression.  Extremities: no edema. WWP. Skin: warm and dry, no rashes noted Neuro: alert and oriented, no focal deficits Psych: Normal affect and mood  ASSESSMENT/PLAN:   Onychomycosis S/p bilateral great toe removal 10/14 with regrowth appropriate for this stage after removal.  Reassured patient.  CMP today for medication profile monitoring  Depression with anxiety Increased anxiety/depression for the past several weeks without a known trigger or change in her life. New medication is terbinafine in that time which does not have known interactions with her depression treatment. - continue venlafaxine and topamax - given resources for counseling which patient is agreeable to at this time - referral to psychiatry for further medication management - collected labs today to r/o other mimicing disorders such as anemia, thyroid disorder, vitamin deficiencies, etc - follow up in 4 weeks or sooner if worsening. Would recommend repeat PHQ9 as well as GAD at f/u   [place]

## 2020-02-28 NOTE — Assessment & Plan Note (Addendum)
S/p bilateral great toe removal 10/14 with regrowth appropriate for this stage after removal.  Reassured patient.  CMP today for medication profile monitoring

## 2020-02-29 LAB — BASIC METABOLIC PANEL
BUN/Creatinine Ratio: 10 (ref 9–23)
BUN: 9 mg/dL (ref 6–20)
CO2: 17 mmol/L — ABNORMAL LOW (ref 20–29)
Calcium: 8.6 mg/dL — ABNORMAL LOW (ref 8.7–10.2)
Chloride: 105 mmol/L (ref 96–106)
Creatinine, Ser: 0.93 mg/dL (ref 0.57–1.00)
GFR calc Af Amer: 91 mL/min/{1.73_m2} (ref >59)
GFR calc non Af Amer: 79 mL/min/{1.73_m2} (ref >59)
Glucose: 91 mg/dL (ref 65–99)
Potassium: 3.9 mmol/L (ref 3.5–5.2)
Sodium: 138 mmol/L (ref 134–144)

## 2020-02-29 LAB — CBC
Hematocrit: 35.8 % (ref 34.0–46.6)
Hemoglobin: 11.7 g/dL (ref 11.1–15.9)
MCH: 26.4 pg — ABNORMAL LOW (ref 26.6–33.0)
MCHC: 32.7 g/dL (ref 31.5–35.7)
MCV: 81 fL (ref 79–97)
Platelets: 283 10*3/uL (ref 150–450)
RBC: 4.44 x10E6/uL (ref 3.77–5.28)
RDW: 12.8 % (ref 11.7–15.4)
WBC: 7.7 10*3/uL (ref 3.4–10.8)

## 2020-02-29 LAB — VITAMIN D 25 HYDROXY (VIT D DEFICIENCY, FRACTURES): Vit D, 25-Hydroxy: 22.8 ng/mL — ABNORMAL LOW (ref 30.0–100.0)

## 2020-02-29 LAB — VITAMIN B12: Vitamin B-12: 803 pg/mL (ref 232–1245)

## 2020-02-29 LAB — TSH: TSH: 2.63 u[IU]/mL (ref 0.450–4.500)

## 2020-03-01 ENCOUNTER — Other Ambulatory Visit: Payer: Self-pay | Admitting: Student in an Organized Health Care Education/Training Program

## 2020-03-01 MED ORDER — VITAMIN D (ERGOCALCIFEROL) 1.25 MG (50000 UNIT) PO CAPS: 50000.0000 [IU] | ORAL_CAPSULE | ORAL | 0 refills | Status: AC

## 2020-03-14 DIAGNOSIS — F4323 Adjustment disorder with mixed anxiety and depressed mood: Secondary | ICD-10-CM | POA: Diagnosis not present

## 2020-03-23 DIAGNOSIS — F4323 Adjustment disorder with mixed anxiety and depressed mood: Secondary | ICD-10-CM | POA: Diagnosis not present

## 2020-03-29 ENCOUNTER — Other Ambulatory Visit: Payer: Self-pay | Admitting: Student in an Organized Health Care Education/Training Program

## 2020-03-30 DIAGNOSIS — F4310 Post-traumatic stress disorder, unspecified: Secondary | ICD-10-CM | POA: Diagnosis not present

## 2020-03-30 DIAGNOSIS — F411 Generalized anxiety disorder: Secondary | ICD-10-CM | POA: Diagnosis not present

## 2020-04-06 DIAGNOSIS — F411 Generalized anxiety disorder: Secondary | ICD-10-CM | POA: Diagnosis not present

## 2020-04-09 NOTE — Progress Notes (Deleted)
    SUBJECTIVE:   CHIEF COMPLAINT / HPI:   Jaw pain:  PERTINENT  PMH / PSH: ***  OBJECTIVE:   There were no vitals taken for this visit.  ***  ASSESSMENT/PLAN:   No problem-specific Assessment & Plan notes found for this encounter.     Gladys Damme, MD [place]

## 2020-04-09 NOTE — Patient Instructions (Incomplete)
It was a pleasure to see you today!  1.    Be Well,  Dr. Paiden Cavell  

## 2020-04-10 ENCOUNTER — Ambulatory Visit: Payer: BC Managed Care – PPO

## 2020-04-10 NOTE — Progress Notes (Deleted)
   Subjective:   Patient ID: Jacqueline Woodard    DOB: 07-Jan-1986, 35 y.o. female   MRN: [id number]  Jacqueline Woodard is a 35 y.o. female with a history of migraine, mild intermittent asthma, onychomycosis, bilateral leg edema, chronic pain in right knee, obesity, depression/anxiety here for jaw pain  Jaw Pain  Review of Systems:  Per HPI.   Objective:   There were no vitals taken for this visit. Vitals and nursing note reviewed.  General: pleasant ***, sitting comfortably in exam chair, well nourished, well developed, in no acute distress with non-toxic appearance HEENT: normocephalic, atraumatic, moist mucous membranes, oropharynx clear without erythema or exudate, TM normal bilaterally  Neck: supple, non-tender without lymphadenopathy CV: regular rate and rhythm without murmurs, rubs, or gallops, no lower extremity edema, 2+ radial and pedal pulses bilaterally Lungs: clear to auscultation bilaterally with normal work of breathing on room air Resp: breathing comfortably on room air, speaking in full sentences Abdomen: soft, non-tender, non-distended, no masses or organomegaly palpable, normoactive bowel sounds Skin: warm, dry, no rashes or lesions Extremities: warm and well perfused, normal tone MSK: ROM grossly intact, strength intact, gait normal Neuro: Alert and oriented, speech normal  Assessment & Plan:   No problem-specific Assessment & Plan notes found for this encounter.  No orders of the defined types were placed in this encounter.  No orders of the defined types were placed in this encounter.   Mina Marble, DO PGY-3, [facility] 04/10/2020 8:14 AM

## 2020-04-13 DIAGNOSIS — F4310 Post-traumatic stress disorder, unspecified: Secondary | ICD-10-CM | POA: Diagnosis not present

## 2020-04-20 ENCOUNTER — Other Ambulatory Visit: Payer: Self-pay | Admitting: Family Medicine

## 2020-04-20 DIAGNOSIS — Z566 Other physical and mental strain related to work: Secondary | ICD-10-CM | POA: Diagnosis not present

## 2020-04-20 DIAGNOSIS — F4323 Adjustment disorder with mixed anxiety and depressed mood: Secondary | ICD-10-CM | POA: Diagnosis not present

## 2020-04-20 DIAGNOSIS — G43709 Chronic migraine without aura, not intractable, without status migrainosus: Secondary | ICD-10-CM

## 2020-04-27 DIAGNOSIS — Z566 Other physical and mental strain related to work: Secondary | ICD-10-CM | POA: Diagnosis not present

## 2020-04-27 DIAGNOSIS — F4323 Adjustment disorder with mixed anxiety and depressed mood: Secondary | ICD-10-CM | POA: Diagnosis not present

## 2020-05-04 DIAGNOSIS — Z566 Other physical and mental strain related to work: Secondary | ICD-10-CM | POA: Diagnosis not present

## 2020-05-04 DIAGNOSIS — F4323 Adjustment disorder with mixed anxiety and depressed mood: Secondary | ICD-10-CM | POA: Diagnosis not present

## 2020-05-11 ENCOUNTER — Other Ambulatory Visit: Payer: Self-pay

## 2020-05-11 ENCOUNTER — Ambulatory Visit: Payer: BC Managed Care – PPO | Admitting: Family Medicine

## 2020-05-11 ENCOUNTER — Encounter: Payer: Self-pay | Admitting: Family Medicine

## 2020-05-11 VITALS — BP 128/86 | HR 83 | Ht 61.0 in | Wt 205.4 lb

## 2020-05-11 DIAGNOSIS — F4323 Adjustment disorder with mixed anxiety and depressed mood: Secondary | ICD-10-CM | POA: Diagnosis not present

## 2020-05-11 DIAGNOSIS — Z0000 Encounter for general adult medical examination without abnormal findings: Secondary | ICD-10-CM

## 2020-05-11 DIAGNOSIS — F418 Other specified anxiety disorders: Secondary | ICD-10-CM | POA: Diagnosis not present

## 2020-05-11 DIAGNOSIS — J4520 Mild intermittent asthma, uncomplicated: Secondary | ICD-10-CM

## 2020-05-11 DIAGNOSIS — E559 Vitamin D deficiency, unspecified: Secondary | ICD-10-CM | POA: Diagnosis not present

## 2020-05-11 DIAGNOSIS — Z566 Other physical and mental strain related to work: Secondary | ICD-10-CM | POA: Diagnosis not present

## 2020-05-11 MED ORDER — ALBUTEROL SULFATE HFA 108 (90 BASE) MCG/ACT IN AERS: INHALATION_SPRAY | RESPIRATORY_TRACT | 2 refills | Status: DC

## 2020-05-11 NOTE — Patient Instructions (Signed)
Thank you for coming to see me today. It was a pleasure. Today we talked about:   Take daily over the counter Vitamin D.  1000units  Please follow-up with me in in the next few months.  If you have any questions or concerns, please do not hesitate to call the office at [phone].  Best,   [place] Constable, DO

## 2020-05-11 NOTE — Assessment & Plan Note (Signed)
She declines labs today.  We will go ahead and have her start on daily supplement total vitamin D over-the-counter. She wants to have labs done, will obtain a vitamin D level.

## 2020-05-11 NOTE — Assessment & Plan Note (Addendum)
Overall doing well.  She is up-to-date on health maintenance except for tetanus.  Advised her that she can obtain this at the pharmacy.  She declines lab work today.  We will have her follow-up in the next few months.

## 2020-05-11 NOTE — Assessment & Plan Note (Signed)
Chronic and stable.  Refilled albuterol as needed.  She uses sparingly.

## 2020-05-11 NOTE — Assessment & Plan Note (Signed)
Overall stable.  Currently being treated with venlafaxine and seeing therapist.  She has been seeing some improvement.  No SI.  She will follow-up in the next few months.

## 2020-05-11 NOTE — Progress Notes (Signed)
Subjective:  CC -- Annual Physical Pt reports she:  Depression Improving Working with therapy Not cyring as much, still has decreased activity level and doesn't want to do things No SI Taking Effexor  Vit D Deficiency Took 5 weeks of 50,000u, now finished, no longer taking  Cardiovascular: - Dx Hypertension: no  - Dx Hyperlipidemia: no - Dx Obesity: yes, Class II  - Physical Activity: yes, tries to, but occasionally falls of, gets steps in with work and at home  - Diabetes: no  Cancer: Colorectal >> Colonoscopy: no, no fam hx Lung >> Tobacco Use: no Breast >> Mammogram: no, no fam hx Cervical/Endometrial >>  - Vaginal Bleeding: yes, been improving, LMP 2/19 - Pap Smear: yes, UTD, Due 01/2022   - Previous Abnormal Pap: no  Skin >> Suspicious lesions: no   Social: Alcohol Use: yes, glass of wine a few times a week  Tobacco Use: no  Other Drugs: no  Risky Sexual Behavior: no  Depression: yes   - PHQ9 score: 13, no SI, in therapy Support and Life at Home: yes   Other: Osteoporosis: no COVID vaccine: booster due next month Zoster Vaccine: no Flu Vaccine: no, declined Pneumonia Vaccine: no   Review of Systems  Constitutional: Negative for fever and weight loss.  HENT: Negative for hearing loss, sore throat and tinnitus.   Eyes: Negative for blurred vision.  Respiratory: Negative for cough, hemoptysis and shortness of breath.   Cardiovascular: Negative for chest pain, palpitations and leg swelling (chronic RLE edema, stable).  Gastrointestinal: Negative for abdominal pain, blood in stool, constipation, diarrhea, melena, nausea and vomiting.  Genitourinary: Negative for dysuria, frequency, hematuria and urgency.  Musculoskeletal: Negative for joint pain and myalgias.  Skin: Negative for rash.  Neurological: Negative for dizziness, tingling, loss of consciousness, weakness and headaches.  Endo/Heme/Allergies: Negative for polydipsia.  Psychiatric/Behavioral:  Negative for memory loss and suicidal ideas.     Past Medical History Patient Active Problem List   Diagnosis Date Noted  . Vitamin D deficiency 05/11/2020  . Depression with anxiety 01/20/2019  . Annual physical exam 11/24/2018  . Class 3 severe obesity with body mass index (BMI) of 45.0 to 49.9 in adult ([place]) 11/24/2018  . Mild intermittent asthma 01/28/2018  . Bilateral leg edema 10/21/2011  . Migraine 07/26/2006    Medications- reviewed and updated Current Outpatient Medications  Medication Sig Dispense Refill  . albuterol (VENTOLIN HFA) 108 (90 Base) MCG/ACT inhaler TAKE 2 PUFFS BY MOUTH EVERY 6 HOURS AS NEEDED FOR WHEEZE OR SHORTNESS OF BREATH 6.7 each 2  . ondansetron (ZOFRAN) 4 MG tablet Take 1 tablet (4 mg total) by mouth every 8 (eight) hours as needed for nausea or vomiting. 20 tablet 0  . SUMAtriptan (IMITREX) 50 MG tablet Take 1 tablet (50 mg total) by mouth every 2 (two) hours as needed for migraine. May repeat in 2 hours if headache persists or recurs. 10 tablet 1  . topiramate (TOPAMAX) 25 MG tablet TAKE 1 TABLET BY MOUTH TWICE A DAY 180 tablet 1  . venlafaxine XR (EFFEXOR-XR) 75 MG 24 hr capsule Take 1 capsule (75 mg total) by mouth daily with breakfast. 90 capsule 1   No current facility-administered medications for this visit.    Objective: BP 128/86   Pulse 83   Ht 5\' 1"  (1.549 m)   Wt 205 lb 6.4 oz (93.2 kg)   LMP 05/06/2020 (Exact Date)   SpO2 98%   BMI 38.81 kg/m  Gen: NAD, alert,  cooperative with exam HEENT: NCAT, EOMI, PERRL CV: RRR, good S1/S2, no murmur Resp: CTABL, no wheezes, non-labored Abd: Soft, Non Tender, Non Distended, BS present, no guarding or organomegaly Genital Exam: not done Ext: No edema, warm Neuro: Alert and oriented, No gross deficits Psych: Mood and affect appropriate for circumstance, thought process linear logical, good insight, no SI   Assessment/Plan:  Annual physical exam Overall doing well.  She is up-to-date on  health maintenance except for tetanus.  Advised her that she can obtain this at the pharmacy.  She declines lab work today.  We will have her follow-up in the next few months.  Depression with anxiety Overall stable.  Currently being treated with venlafaxine and seeing therapist.  She has been seeing some improvement.  No SI.  She will follow-up in the next few months.  Vitamin D deficiency She declines labs today.  We will go ahead and have her start on daily supplement total vitamin D over-the-counter. She wants to have labs done, will obtain a vitamin D level.  Mild intermittent asthma Chronic and stable.  Refilled albuterol as needed.  She uses sparingly.   Advised that she will be due for her COVID booster next month.  No orders of the defined types were placed in this encounter.   Meds ordered this encounter  Medications  . albuterol (VENTOLIN HFA) 108 (90 Base) MCG/ACT inhaler    Sig: TAKE 2 PUFFS BY MOUTH EVERY 6 HOURS AS NEEDED FOR WHEEZE OR SHORTNESS OF BREATH    Dispense:  6.7 each    Refill:  [place], DO, PGY-3 05/11/2020 3:58 PM

## 2020-05-18 DIAGNOSIS — Z566 Other physical and mental strain related to work: Secondary | ICD-10-CM | POA: Diagnosis not present

## 2020-05-18 DIAGNOSIS — F4323 Adjustment disorder with mixed anxiety and depressed mood: Secondary | ICD-10-CM | POA: Diagnosis not present

## 2020-05-25 DIAGNOSIS — Z566 Other physical and mental strain related to work: Secondary | ICD-10-CM | POA: Diagnosis not present

## 2020-05-25 DIAGNOSIS — F4323 Adjustment disorder with mixed anxiety and depressed mood: Secondary | ICD-10-CM | POA: Diagnosis not present

## 2020-06-01 DIAGNOSIS — Z566 Other physical and mental strain related to work: Secondary | ICD-10-CM | POA: Diagnosis not present

## 2020-06-01 DIAGNOSIS — F4323 Adjustment disorder with mixed anxiety and depressed mood: Secondary | ICD-10-CM | POA: Diagnosis not present

## 2020-06-03 ENCOUNTER — Other Ambulatory Visit: Payer: Self-pay | Admitting: Family Medicine

## 2020-06-03 DIAGNOSIS — G43709 Chronic migraine without aura, not intractable, without status migrainosus: Secondary | ICD-10-CM

## 2020-06-08 ENCOUNTER — Encounter (HOSPITAL_COMMUNITY): Payer: Self-pay

## 2020-06-08 ENCOUNTER — Other Ambulatory Visit: Payer: Self-pay

## 2020-06-08 ENCOUNTER — Ambulatory Visit (HOSPITAL_COMMUNITY)
Admission: EM | Admit: 2020-06-08 | Discharge: 2020-06-08 | Disposition: A | Discharge status: Home / Self Care | Payer: BC Managed Care – PPO | Attending: Internal Medicine | Admitting: Internal Medicine

## 2020-06-08 DIAGNOSIS — G43909 Migraine, unspecified, not intractable, without status migrainosus: Secondary | ICD-10-CM | POA: Insufficient documentation

## 2020-06-08 DIAGNOSIS — Z79899 Other long term (current) drug therapy: Secondary | ICD-10-CM | POA: Diagnosis not present

## 2020-06-08 DIAGNOSIS — Z881 Allergy status to other antibiotic agents status: Secondary | ICD-10-CM | POA: Insufficient documentation

## 2020-06-08 DIAGNOSIS — R5383 Other fatigue: Secondary | ICD-10-CM

## 2020-06-08 DIAGNOSIS — R6883 Chills (without fever): Secondary | ICD-10-CM | POA: Diagnosis not present

## 2020-06-08 DIAGNOSIS — R110 Nausea: Secondary | ICD-10-CM | POA: Insufficient documentation

## 2020-06-08 LAB — COMPREHENSIVE METABOLIC PANEL
ALT: 8 U/L (ref 0–44)
AST: 25 U/L (ref 15–41)
Albumin: 3.3 g/dL — ABNORMAL LOW (ref 3.5–5.0)
Alkaline Phosphatase: 101 U/L (ref 38–126)
Anion gap: 5 (ref 5–15)
BUN: 11 mg/dL (ref 6–20)
CO2: 25 mmol/L (ref 22–32)
Calcium: 8.3 mg/dL — ABNORMAL LOW (ref 8.9–10.3)
Chloride: 107 mmol/L (ref 98–111)
Creatinine, Ser: 0.88 mg/dL (ref 0.44–1.00)
GFR, Estimated: >60 mL/min (ref >60)
Glucose, Bld: 140 mg/dL — ABNORMAL HIGH (ref 70–99)
Potassium: 3.8 mmol/L (ref 3.5–5.1)
Sodium: 137 mmol/L (ref 135–145)
Total Bilirubin: 0.4 mg/dL (ref 0.3–1.2)
Total Protein: 6.7 g/dL (ref 6.5–8.1)

## 2020-06-08 LAB — CBC
HCT: 39.6 % (ref 36.0–46.0)
Hemoglobin: 12.1 g/dL (ref 12.0–15.0)
MCH: 26 pg (ref 26.0–34.0)
MCHC: 30.6 g/dL (ref 30.0–36.0)
MCV: 85.2 fL (ref 80.0–100.0)
Platelets: 310 10*3/uL (ref 150–400)
RBC: 4.65 MIL/uL (ref 3.87–5.11)
RDW: 13.8 % (ref 11.5–15.5)
WBC: 8.7 10*3/uL (ref 4.0–10.5)
nRBC: 0 % (ref 0.0–0.2)

## 2020-06-08 LAB — VITAMIN D 25 HYDROXY (VIT D DEFICIENCY, FRACTURES): Vit D, 25-Hydroxy: 37.57 ng/mL (ref 30–100)

## 2020-06-08 NOTE — Discharge Instructions (Addendum)
Increase oral fluid intake We will call you with recommendations if your labs are abnormal If your symptoms worsen please return to urgent care to be reevaluated.

## 2020-06-08 NOTE — ED Triage Notes (Signed)
Pt presents with headache chills, nausea and fatigue x 2 days. Tylenol and ibuprofen gives no relief; ibuprofen last dose 9 am today.

## 2020-06-08 NOTE — ED Provider Notes (Signed)
[facility]    CSN: [id number] Arrival date & time: 06/08/20  1818      History   Chief Complaint Chief Complaint  Patient presents with  . Nausea  . Fatigue  . Headache    HPI Jacqueline Woodard is a 35 y.o. female comes to the urgent care with 2-day history of fatigue and headache of 4 days duration.  Patient has a history of migraines currently managed with Topamax and as needed Imitrex.  She started experiencing migrainous headaches over the past several days and has been taking her routine medications with partial success.  She denies any fever but admits having chills.  She is also had some nausea but no vomiting.  No diarrhea.  She is had increasing fatigue over the past couple of days.  Patient is fully vaccinated against COVID-19 virus.  No sick contacts.  Patient's last menstrual period ended on 26 May 2020.  No vaginal discharge no dysuria urgency or frequency.  No cough or sputum production or shortness of breath or chest pain.  No diarrhea   HPI  Past Medical History:  Diagnosis Date  . Chronic migraine   . Endometrial polyp   . GERD (gastroesophageal reflux disease)   . Hereditary lymphedema of legs    chronic --- right > left  . History of acute renal failure    in setting cellulitis left lower leg 06/ 2008 and right lower leg cellulitis with sepsis  . History of borderline diabetes mellitus    08-29-2017 PER PT DX 2016 was checked again last year no longer pre-diabetic  . History of cellulitis    06/ 2008  left lower leg cellulitis:  12-05-2013  and 06-04-2014-- right lower leg cellulitis w/ sepsis  . History of Clostridium difficile infection 08/2006  . History of gastric ulcer 2015  . History of ovarian cyst   . History of sepsis    w/ right lower leg cellulitis 12-05-2013 and 06-04-2014  . Microcytic anemia   . Mild asthma    INHALER PRN-- LAST USED 2014  . Uterine fibroid   . Wears glasses     Patient Active Problem List   Diagnosis  Date Noted  . Vitamin D deficiency 05/11/2020  . Depression with anxiety 01/20/2019  . Annual physical exam 11/24/2018  . Class 3 severe obesity with body mass index (BMI) of 45.0 to 49.9 in adult ([place]) 11/24/2018  . Mild intermittent asthma 01/28/2018  . Bilateral leg edema 10/21/2011  . Migraine 07/26/2006    Past Surgical History:  Procedure Laterality Date  . BREAST REDUCTION SURGERY Bilateral 03/01/2013   Procedure: BILATERAL MAMMARY REDUCTION  (BREAST);  Surgeon: Cristine Polio, MD;  Location: [place];  Service: Plastics;  Laterality: Bilateral;  . BREAST SURGERY Bilateral 2015   Breast reduction  . DILATATION & CURETTAGE/HYSTEROSCOPY WITH MYOSURE N/A 09/05/2017   Procedure: DILATATION & CURETTAGE/HYSTEROSCOPY WITH MYOSURE;  Surgeon: Anastasio Auerbach, MD;  Location: [facility];  Service: Gynecology;  Laterality: N/A;  requests 7:30am OR time Requests one hour  . DILATION AND CURETTAGE OF UTERUS  09/05/2017   Hyperplastic uterus, uterine polyp removal, irregular bleeding  . WISDOM TOOTH EXTRACTION      OB History    Gravida  0   Para  0   Term  0   Preterm  0   AB  0   Living  0     SAB  0   IAB  0  Ectopic  0   Multiple  0   Live Births  0            Home Medications    Prior to Admission medications   Medication Sig Start Date End Date Taking? Authorizing Provider  albuterol (VENTOLIN HFA) 108 (90 Base) MCG/ACT inhaler TAKE 2 PUFFS BY MOUTH EVERY 6 HOURS AS NEEDED FOR WHEEZE OR SHORTNESS OF BREATH 05/11/20   Meccariello, Bernita Raisin, DO  ondansetron (ZOFRAN) 4 MG tablet Take 1 tablet (4 mg total) by mouth every 8 (eight) hours as needed for nausea or vomiting. 07/09/19   Meccariello, Bernita Raisin, DO  SUMAtriptan (IMITREX) 50 MG tablet TAKE 1 TAB EVERY 2 HRS AS NEEDED FOR MIGRAINE. MAY REPEAT IN 2 HOURS IF HEADACHE PERSISTS OR RECURS. 06/05/20   Meccariello, Bernita Raisin, DO  topiramate (TOPAMAX) 25 MG tablet TAKE 1 TABLET  BY MOUTH TWICE A DAY 04/20/20   Meccariello, Bernita Raisin, DO  venlafaxine XR (EFFEXOR-XR) 75 MG 24 hr capsule Take 1 capsule (75 mg total) by mouth daily with breakfast. 09/30/19   Meccariello, Bernita Raisin, DO    Family History Family History  Problem Relation Age of Onset  . Thyroid disease Mother   . Fibroids Mother   . Asthma Father   . Edema Father     Social History Social History   Tobacco Use  . Smoking status: Never Smoker  . Smokeless tobacco: Never Used  Vaping Use  . Vaping Use: Never used  Substance Use Topics  . Alcohol use: Yes    Comment: Social  . Drug use: No     Allergies   Shellfish allergy and Vancomycin   Review of Systems Review of Systems  Respiratory: Negative.   Gastrointestinal: Negative.   Genitourinary: Negative.   Musculoskeletal: Negative.   Skin: Negative.   Neurological: Positive for weakness.     Physical Exam Triage Vital Signs ED Triage Vitals  Enc Vitals Group     BP 06/08/20 1852 (!) 133/92     Pulse Rate 06/08/20 1852 85     Resp 06/08/20 1852 20     Temp 06/08/20 1852 97.8 F (36.6 C)     Temp Source 06/08/20 1852 Oral     SpO2 06/08/20 1852 99 %     Weight --      Height --      Head Circumference --      Peak Flow --      Pain Score 06/08/20 1845 8     Pain Loc --      Pain Edu? --      Excl. in [place]? --    No data found.  Updated Vital Signs BP (!) 133/92 (BP Location: Right Arm)   Pulse 85   Temp 97.8 F (36.6 C) (Oral)   Resp 20   SpO2 99%   Visual Acuity Right Eye Distance:   Left Eye Distance:   Bilateral Distance:    Right Eye Near:   Left Eye Near:    Bilateral Near:     Physical Exam   UC Treatments / Results  Labs (all labs ordered are listed, but only abnormal results are displayed) Labs Reviewed  CBC  COMPREHENSIVE METABOLIC PANEL  VITAMIN D 25 HYDROXY (VIT D DEFICIENCY, FRACTURES)    EKG   Radiology No results found.  Procedures Procedures (including critical care  time)  Medications Ordered in UC Medications - No data to display  Initial Impression / Assessment and Plan /  UC Course  I have reviewed the triage vital signs and the nursing notes.  Pertinent labs & imaging results that were available during my care of the patient were reviewed by me and considered in my medical decision making (see chart for details).     1.  Unspecified fatigue: CBC, BMP and vitamin D level There are no signs of infection and patient has no bleeding episodes. Patient is encouraged to increase oral fluid intake. Patient remains hemodynamically stable We will call patient if labs are abnormal Return precautions given. Final Clinical Impressions(s) / UC Diagnoses   Final diagnoses:  Fatigue, unspecified type     Discharge Instructions     Increase oral fluid intake We will call you with recommendations if your labs are abnormal If your symptoms worsen please return to urgent care to be reevaluated.    ED Prescriptions    None     PDMP not reviewed this encounter.   Chase Picket, MD 06/08/20 [phone]

## 2020-06-12 ENCOUNTER — Ambulatory Visit: Payer: BC Managed Care – PPO | Admitting: Family Medicine

## 2020-06-12 ENCOUNTER — Other Ambulatory Visit: Payer: Self-pay | Admitting: Family Medicine

## 2020-06-12 ENCOUNTER — Other Ambulatory Visit: Payer: Self-pay

## 2020-06-12 DIAGNOSIS — G43709 Chronic migraine without aura, not intractable, without status migrainosus: Secondary | ICD-10-CM

## 2020-06-12 NOTE — Progress Notes (Deleted)
    SUBJECTIVE:   CHIEF COMPLAINT / HPI:   Urgent care follow-up-blood pressure/headache: Patient is a 35 year old female that presents for an urgent care visit for fatigue where she was noted to have elevated blood pressure 133/90 today.  At that visit she had multiple labs drawn which were mostly unremarkable.  She does not currently take any antihypertensive medications.  Today she states***.  PERTINENT  PMH / PSH: ***  OBJECTIVE:   There were no vitals taken for this visit.   General: NAD, pleasant, able to participate in exam Cardiac: RRR, no murmurs. Respiratory: CTAB, normal effort, No wheezes, rales or rhonchi Skin: warm and dry, no rashes noted Neuro: alert, no obvious focal deficits Psych: Normal affect and mood  ASSESSMENT/PLAN:   No problem-specific Assessment & Plan notes found for this encounter.     Lurline Del, [place]    This note was prepared using Dragon voice recognition software and may include unintentional dictation errors due to the inherent limitations of voice recognition software.  {    This will disappear when note is signed, click to select method of visit    :1}

## 2020-06-12 NOTE — Patient Instructions (Incomplete)
It was great to see you! Thank you for allowing me to participate in your care!  I recommend that you always bring your medications to each appointment as this makes it easy to ensure we are on the correct medications and helps [place] not miss when refills are needed.  Our plans for today:  - *** -   We are checking some labs today, I will call you if they are abnormal will send you a MyChart message or a letter if they are normal.  If you do not hear about your labs in the next 2 weeks please let us know.***  Take care and seek immediate care sooner if you develop any concerns.   Dr. Lurline Del, [place]

## 2020-06-13 MED ORDER — ONDANSETRON HCL 4 MG PO TABS: 4.0000 mg | ORAL_TABLET | Freq: Three times a day (TID) | ORAL | 0 refills | Status: DC | PRN

## 2020-06-15 DIAGNOSIS — Z566 Other physical and mental strain related to work: Secondary | ICD-10-CM | POA: Diagnosis not present

## 2020-06-15 DIAGNOSIS — F4323 Adjustment disorder with mixed anxiety and depressed mood: Secondary | ICD-10-CM | POA: Diagnosis not present

## 2020-06-16 ENCOUNTER — Other Ambulatory Visit: Payer: Self-pay

## 2020-06-16 ENCOUNTER — Ambulatory Visit: Payer: BC Managed Care – PPO | Admitting: Family Medicine

## 2020-06-16 VITALS — BP 138/92 | HR 83 | Ht 61.0 in | Wt 208.0 lb

## 2020-06-16 DIAGNOSIS — Z0130 Encounter for examination of blood pressure without abnormal findings: Secondary | ICD-10-CM

## 2020-06-16 DIAGNOSIS — R110 Nausea: Secondary | ICD-10-CM | POA: Diagnosis not present

## 2020-06-16 LAB — POCT URINE PREGNANCY: Preg Test, Ur: NEGATIVE

## 2020-06-16 NOTE — Assessment & Plan Note (Signed)
One episode of nausea last week assoc with constipation and reflux symptoms. Pt is of child bearing age and sexually active so obtained u preg. On exam: minor tenderness in LUQ. Pt most likely has reflux. Recommended OTC tums and lifestyle modifications to help with sx. CMP at recent urgent care wnl. No ETOH intake so low suspicion for pancreatitis. F/u with PCP if no improvement in sx and could consider protonix +/- GI referral.

## 2020-06-16 NOTE — Progress Notes (Signed)
     SUBJECTIVE:   CHIEF COMPLAINT / HPI:   Jacqueline Woodard is a 35 y.o. female presents for headache    Blood pressure concerns Pt reports feeling unwell for the last 1 week. BP at work which was 192/101, 171/101. She has been checking her BP at home: 148/94, 141/92. No recent stressors. Pt has never been on BP medications before. Did have right sided headache and right sided eye pain when her Bps were elevated.   Patient has had a BMP in the past 1 year.  Nauseous  Stomach was "upset and would not settle down". Tried zofran at home [facility] but did not help. Was not able to eat or drink. Assoc with constipation and some reflux. Denies vomiting, abdominal pains, fevers, diarrhea, rectal bleeding, melena, dysuria, frequency, urgency or hematuria. Sexually active with one female. LMP March 19th. Always uses condoms. Denies ETOH use.  [facility] Visit from 02/28/2020 in [place]  PHQ-9 Total Score 9       Health Maintenance Due  Topic  . TETANUS/TDAP      PERTINENT  PMH / PSH: migraines, depression   OBJECTIVE:   BP (!) 138/92   Pulse 83   Ht 5\' 1"  (1.549 m)   Wt 208 lb (94.3 kg)   LMP 06/03/2020 (Exact Date)   SpO2 99%   BMI 39.30 kg/m    General: Alert, no acute distress Cardio: well perfused  Pulm: normal work of breathing Abdomen: Bowel sounds normal. Abdomen soft, mild tenderness in LUQ., no guarding. Neuro: Cranial nerves grossly intact   ASSESSMENT/PLAN:   Nausea One episode of nausea last week assoc with constipation and reflux symptoms. Pt is of child bearing age and sexually active so obtained u preg. On exam: minor tenderness in LUQ. Pt most likely has reflux. Recommended OTC tums and lifestyle modifications to help with sx. CMP at recent urgent care wnl. No ETOH intake so low suspicion for pancreatitis. F/u with PCP if no improvement in sx and could consider protonix +/- GI referral.   Blood pressure check Pt with recent hx  of elevated Bps >208 systolic with headaches last week. She has had several clinic and urgent care visits recently where she has been normotensive. Her BP today was 138/92. Normal visual acuity in setting of recent headaches. Reassured pt that her BP is normal today and that I would not add an anti-hypertensive today. Recommends she keeps a BP diary at home and measures it twice a day for the next 2 weeks and follows up with PCP. Provided strict ER precautions. Pt expressed understanding and is happy with the plan.      Lattie Haw, MD PGY-2 [place]

## 2020-06-16 NOTE — Patient Instructions (Signed)
Thank you for coming to see me today. It was a pleasure. Today we discussed your BP. Your BP is normal today.  I recommend keep a BP diary at home, checking it twice a day at the same time with the same cuff. Follow up with your PCP. Go to the ER if you have headaches with high Bps, BP >210, vision changes.  For your nausea, take over the counter TUMS. It is probably related to reflux.  If you have any questions or concerns, please do not hesitate to call the office at [phone].  Best wishes,   Dr Posey Pronto    Food Choices for Gastroesophageal Reflux Disease, Adult When you have gastroesophageal reflux disease (GERD), the foods you eat and your eating habits are very important. Choosing the right foods can help ease your discomfort. Think about working with a food expert (dietitian) to help you make good choices. What are tips for following this plan? Reading food labels  Look for foods that are low in saturated fat. Foods that may help with your symptoms include: ? Foods that have less than 5% of daily value (DV) of fat. ? Foods that have 0 grams of trans fat. Cooking  Do not fry your food.  Cook your food by baking, steaming, grilling, or broiling. These are all methods that do not need a lot of fat for cooking.  To add flavor, try to use herbs that are low in spice and acidity. Meal planning  Choose healthy foods that are low in fat, such as: ? Fruits and vegetables. ? Whole grains. ? Low-fat dairy products. ? Lean meats, fish, and poultry.  Eat small meals often instead of eating 3 large meals each day. Eat your meals slowly in a place where you are relaxed. Avoid bending over or lying down until 2-3 hours after eating.  Limit high-fat foods such as fatty meats or fried foods.  Limit your intake of fatty foods, such as oils, butter, and shortening.  Avoid the following as told by your doctor: ? Foods that cause symptoms. These may be different for different people. Keep  a food diary to keep track of foods that cause symptoms. ? Alcohol. ? Drinking a lot of liquid with meals. ? Eating meals during the 2-3 hours before bed.   Lifestyle  Stay at a healthy weight. Ask your doctor what weight is healthy for you. If you need to lose weight, work with your doctor to do so safely.  Exercise for at least 30 minutes on 5 or more days each week, or as told by your doctor.  Wear loose-fitting clothes.  Do not smoke or use any products that contain nicotine or tobacco. If you need help quitting, ask your doctor.  Sleep with the head of your bed higher than your feet. Use a wedge under the mattress or blocks under the bed frame to raise the head of the bed.  Chew sugar-free gum after meals. What foods should eat? Eat a healthy, well-balanced diet of fruits, vegetables, whole grains, low-fat dairy products, lean meats, fish, and poultry. Each person is different. Foods that may cause symptoms in one person may not cause any symptoms in another person. Work with your doctor to find foods that are safe for you. The items listed above may not be a complete list of what you can eat and drink. Contact a food expert for more options.   What foods should I avoid? Limiting some of these foods may help  in managing the symptoms of GERD. Everyone is different. Talk with a food expert or your doctor to help you find the exact foods to avoid, if any. Fruits Any fruits prepared with added fat. Any fruits that cause symptoms. For some people, this may include citrus fruits, such as oranges, grapefruit, pineapple, and lemons. Vegetables Deep-fried vegetables. [place] fries. Any vegetables prepared with added fat. Any vegetables that cause symptoms. For some people, this may include tomatoes and tomato products, chili peppers, onions and garlic, and horseradish. Grains Pastries or quick breads with added fat. Meats and other proteins High-fat meats, such as fatty beef or pork, hot  dogs, ribs, ham, sausage, salami, and bacon. Fried meat or protein, including fried fish and fried chicken. Nuts and nut butters, in large amounts. Dairy Whole milk and chocolate milk. Sour cream. Cream. Ice cream. Cream cheese. Milkshakes. Fats and oils Butter. Margarine. Shortening. Ghee. Beverages Coffee and tea, with or without caffeine. Carbonated beverages. Sodas. Energy drinks. Fruit juice made with acidic fruits, such as orange or grapefruit. Tomato juice. Alcoholic drinks. Sweets and desserts Chocolate and cocoa. Donuts. Seasonings and condiments Pepper. Peppermint and spearmint. Added salt. Any condiments, herbs, or seasonings that cause symptoms. For some people, this may include curry, hot sauce, or vinegar-based salad dressings. The items listed above may not be a complete list of what you should not eat and drink. Contact a food expert for more options. Questions to ask your doctor Diet and lifestyle changes are often the first steps that are taken to manage symptoms of GERD. If diet and lifestyle changes do not help, talk with your doctor about taking medicines. Where to find more information  International Foundation for Gastrointestinal Disorders: aboutgerd.org Summary  When you have GERD, food and lifestyle choices are very important in easing your symptoms.  Eat small meals often instead of 3 large meals a day. Eat your meals slowly and in a place where you are relaxed.  Avoid bending over or lying down until 2-3 hours after eating.  Limit high-fat foods such as fatty meats or fried foods. This information is not intended to replace advice given to you by your health care provider. Make sure you discuss any questions you have with your health care provider. Document Revised: 09/13/2019 Document Reviewed: 09/13/2019 Elsevier Patient Education  [place].

## 2020-06-16 NOTE — Assessment & Plan Note (Signed)
Pt with recent hx of elevated Bps >391 systolic with headaches last week. She has had several clinic and urgent care visits recently where she has been normotensive. Her BP today was 138/92. Normal visual acuity in setting of recent headaches. Reassured pt that her BP is normal today and that I would not add an anti-hypertensive today. Recommends she keeps a BP diary at home and measures it twice a day for the next 2 weeks and follows up with PCP. Provided strict ER precautions. Pt expressed understanding and is happy with the plan.

## 2020-06-22 DIAGNOSIS — Z566 Other physical and mental strain related to work: Secondary | ICD-10-CM | POA: Diagnosis not present

## 2020-06-22 DIAGNOSIS — F4323 Adjustment disorder with mixed anxiety and depressed mood: Secondary | ICD-10-CM | POA: Diagnosis not present

## 2020-06-27 ENCOUNTER — Other Ambulatory Visit: Payer: Self-pay | Admitting: Family Medicine

## 2020-06-29 DIAGNOSIS — Z566 Other physical and mental strain related to work: Secondary | ICD-10-CM | POA: Diagnosis not present

## 2020-06-29 DIAGNOSIS — F4323 Adjustment disorder with mixed anxiety and depressed mood: Secondary | ICD-10-CM | POA: Diagnosis not present

## 2020-07-03 ENCOUNTER — Ambulatory Visit: Payer: BC Managed Care – PPO | Admitting: Family Medicine

## 2020-07-03 ENCOUNTER — Telehealth: Payer: Self-pay | Admitting: Family Medicine

## 2020-07-03 NOTE — Telephone Encounter (Signed)
Patient calls nurse line due to positive COVID results. Tested positive on Thursday 4/14. Patient reports that she has had to use inhaler since being diagnosed with COVID. She is using inhaler every couple hours as needed. Patient is also needing a work note to extend past 5 day quarantine period, as she is still having cough and SHOB. Patient reports that [facility] is intermittent and improves with use of inhaler. Patient is able to speak in complete sentences and does not appear to be in respiratory distress.   Scheduled patient in [place] clinic tomorrow afternoon for further evaluation. Provided with strict ED precautions in the meantime. Patient verbalizes understanding.     Talbot Grumbling, RN

## 2020-07-03 NOTE — Telephone Encounter (Signed)
Patient is calling to inform Dr. Sandi Carne that she missed her appointment today due testing positive for Covid.   She will call back to reschedule at another time.

## 2020-07-04 ENCOUNTER — Ambulatory Visit (INDEPENDENT_AMBULATORY_CARE_PROVIDER_SITE_OTHER): Payer: BC Managed Care – PPO | Admitting: Family Medicine

## 2020-07-04 ENCOUNTER — Other Ambulatory Visit: Payer: Self-pay

## 2020-07-04 DIAGNOSIS — R0602 Shortness of breath: Secondary | ICD-10-CM

## 2020-07-04 DIAGNOSIS — H1013 Acute atopic conjunctivitis, bilateral: Secondary | ICD-10-CM | POA: Diagnosis not present

## 2020-07-04 DIAGNOSIS — H1010 Acute atopic conjunctivitis, unspecified eye: Secondary | ICD-10-CM | POA: Insufficient documentation

## 2020-07-04 MED ORDER — FLUTICASONE PROPIONATE 50 MCG/ACT NA SUSP: 2.0000 | Freq: Every day | NASAL | 6 refills | Status: DC

## 2020-07-04 MED ORDER — BUDESONIDE-FORMOTEROL FUMARATE 80-4.5 MCG/ACT IN AERO: 2.0000 | INHALATION_SPRAY | Freq: Two times a day (BID) | RESPIRATORY_TRACT | 3 refills | Status: DC

## 2020-07-04 MED ORDER — OLOPATADINE HCL 0.1 % OP SOLN: 1.0000 [drp] | Freq: Two times a day (BID) | OPHTHALMIC | 12 refills | Status: AC

## 2020-07-04 MED ORDER — LORATADINE 10 MG PO TBDP: 10.0000 mg | ORAL_TABLET | Freq: Every day | ORAL | 12 refills | Status: AC

## 2020-07-04 NOTE — Progress Notes (Signed)
    SUBJECTIVE:  CHIEF COMPLAINT / HPI:   Eye redness  Patient presenting with redness of the bottom of both of her eyes.  She reports that this has been present for the past 3 to 4 days.  She thought it was allergies as it started after she open the windows to get some fresh air after being quarantine after COVID positive test last Thursday.  She denies any trauma.  She tried over-the-counter loratadine and some redness relief eyedrops without relief.  She does report that she had itching for the first 2 days and now they feel little sore and swollen.  She does not note any gritty feeling or the feeling of anything in her eyes.  She denies any changes in vision.  She denies any burning or sharp pain.  No pain with movement of her eyes.  Denies any purulent discharge.  Shortness of breath Patient reports that she has felt short of breath and has been using albuterol every 2 hours since she was diagnosed with COVID last Thursday.  She does report that she had other COVID symptoms such as fatigue, diarrhea, rhinorrhea, sore throat.  She reports that the albuterol feels like it may be working, but she is not sure.  She feels wheezy and has some chest tightness.  She last used the albuterol this morning.   PERTINENT  PMH / PSH: Well controlled asthma, migraines, obesity, depression   OBJECTIVE:  BP 126/80   Pulse (!) 102   Ht 5' (1.524 m)   Wt 208 lb (94.3 kg)   LMP 06/28/2020 (Exact Date)   SpO2 100%   BMI 40.62 kg/m   General: Well-appearing female, no acute distress HEENT: NCAT.  External ear exam normal bilaterally with pearly gray TMs.  Eyes: EOMI, no pain.  PERRLA.  Lower conjunctival injection with clear watery discharge.  False eyelashes in place bilaterally.  Nose: Boggy turbinates bilaterally with erythema.  Nares otherwise patent.  Oropharynx clear without erythema or tonsillar exudate. Neck: No cervical adenopathy.  No palpable thyroid. Lungs: Lungs clear to auscultation bilaterally  with good air movement.  No wheezing appreciated.  Patient has mild dry cough.  Speaking in full sentences. Chest: Regular rate and rhythm.  No murmurs appreciated.  Unable to check cap refill given nail polish.   ASSESSMENT/PLAN:  Allergic conjunctivitis Allergic versus COVID conjunctivitis.  No red flag symptoms at this time.  Will treat symptomatically with Pataday drops.  Patient provided with return precautions.  Also providing Flonase and loratadine.  Shortness of breath Lung physical exam benign.  Patient has good air movement bilaterally without any wheezing.  She reports she last took her albuterol this morning.  If this were an asthma exacerbation, I would expect wheezing with limited air movement.  She was also positive for COVID, but is satting 100% on room air and is speaking in full sentences.  She has a dry mild cough on exam.  This is likely a combination of COVID viral symptoms and/or seasonal allergies.  We will try treating allergies with Flonase, loratadine.  Additionally, will send Symbicort for as needed use to replace home albuterol.  Patient provided ED precautions for severe shortness of breath.     Wilber Oliphant, MD [place]

## 2020-07-04 NOTE — Patient Instructions (Addendum)
Your symptoms can be due to allergies versus COVID viral symptoms.  We talked about treating the symptoms of this.  You can use the eyedrops for the itching and irritation of your eyes.  Also use the Flonase daily.  It is important that you take this medication daily as it helps the medication build up in your system.  Additionally, continue to take the Claritin every day.  We are sending another medication for your asthma called Symbicort.  You can take this as needed in place of the albuterol to help with your shortness of breath.

## 2020-07-04 NOTE — Assessment & Plan Note (Signed)
Allergic versus COVID conjunctivitis.  No red flag symptoms at this time.  Will treat symptomatically with Pataday drops.  Patient provided with return precautions.  Also providing Flonase and loratadine.

## 2020-07-04 NOTE — Assessment & Plan Note (Signed)
Lung physical exam benign.  Patient has good air movement bilaterally without any wheezing.  She reports she last took her albuterol this morning.  If this were an asthma exacerbation, I would expect wheezing with limited air movement.  She was also positive for COVID, but is satting 100% on room air and is speaking in full sentences.  She has a dry mild cough on exam.  This is likely a combination of COVID viral symptoms and/or seasonal allergies.  We will try treating allergies with Flonase, loratadine.  Additionally, will send Symbicort for as needed use to replace home albuterol.  Patient provided ED precautions for severe shortness of breath.

## 2020-07-06 DIAGNOSIS — Z566 Other physical and mental strain related to work: Secondary | ICD-10-CM | POA: Diagnosis not present

## 2020-07-06 DIAGNOSIS — F4323 Adjustment disorder with mixed anxiety and depressed mood: Secondary | ICD-10-CM | POA: Diagnosis not present

## 2020-07-13 DIAGNOSIS — Z566 Other physical and mental strain related to work: Secondary | ICD-10-CM | POA: Diagnosis not present

## 2020-07-13 DIAGNOSIS — F4323 Adjustment disorder with mixed anxiety and depressed mood: Secondary | ICD-10-CM | POA: Diagnosis not present

## 2020-07-20 DIAGNOSIS — Z566 Other physical and mental strain related to work: Secondary | ICD-10-CM | POA: Diagnosis not present

## 2020-07-20 DIAGNOSIS — F4323 Adjustment disorder with mixed anxiety and depressed mood: Secondary | ICD-10-CM | POA: Diagnosis not present

## 2020-07-27 DIAGNOSIS — Z566 Other physical and mental strain related to work: Secondary | ICD-10-CM | POA: Diagnosis not present

## 2020-07-27 DIAGNOSIS — F4323 Adjustment disorder with mixed anxiety and depressed mood: Secondary | ICD-10-CM | POA: Diagnosis not present

## 2020-08-03 DIAGNOSIS — F4323 Adjustment disorder with mixed anxiety and depressed mood: Secondary | ICD-10-CM | POA: Diagnosis not present

## 2020-08-03 DIAGNOSIS — F331 Major depressive disorder, recurrent, moderate: Secondary | ICD-10-CM | POA: Diagnosis not present

## 2020-08-03 DIAGNOSIS — Z566 Other physical and mental strain related to work: Secondary | ICD-10-CM | POA: Diagnosis not present

## 2020-08-10 DIAGNOSIS — Z566 Other physical and mental strain related to work: Secondary | ICD-10-CM | POA: Diagnosis not present

## 2020-08-10 DIAGNOSIS — F4323 Adjustment disorder with mixed anxiety and depressed mood: Secondary | ICD-10-CM | POA: Diagnosis not present

## 2020-08-17 DIAGNOSIS — F4323 Adjustment disorder with mixed anxiety and depressed mood: Secondary | ICD-10-CM | POA: Diagnosis not present

## 2020-08-17 DIAGNOSIS — Z566 Other physical and mental strain related to work: Secondary | ICD-10-CM | POA: Diagnosis not present

## 2020-08-25 ENCOUNTER — Other Ambulatory Visit (HOSPITAL_COMMUNITY)
Admission: RE | Admit: 2020-08-25 | Discharge: 2020-08-25 | Disposition: A | Discharge status: Home / Self Care | Payer: BC Managed Care – PPO | Source: Ambulatory Visit | Attending: Family Medicine | Admitting: Family Medicine

## 2020-08-25 ENCOUNTER — Ambulatory Visit: Payer: BC Managed Care – PPO | Admitting: Family Medicine

## 2020-08-25 ENCOUNTER — Encounter: Payer: Self-pay | Admitting: Family Medicine

## 2020-08-25 ENCOUNTER — Other Ambulatory Visit: Payer: Self-pay

## 2020-08-25 VITALS — BP 122/86 | HR 91 | Ht 60.0 in | Wt 209.8 lb

## 2020-08-25 DIAGNOSIS — Z3009 Encounter for other general counseling and advice on contraception: Secondary | ICD-10-CM | POA: Diagnosis not present

## 2020-08-25 DIAGNOSIS — Z113 Encounter for screening for infections with a predominantly sexual mode of transmission: Secondary | ICD-10-CM | POA: Insufficient documentation

## 2020-08-25 DIAGNOSIS — Z30430 Encounter for insertion of intrauterine contraceptive device: Secondary | ICD-10-CM | POA: Insufficient documentation

## 2020-08-25 LAB — POCT WET PREP (WET MOUNT)
Clue Cells Wet Prep Whiff POC: NEGATIVE
Trichomonas Wet Prep HPF POC: ABSENT

## 2020-08-25 LAB — POCT URINE PREGNANCY: Preg Test, Ur: NEGATIVE

## 2020-08-25 NOTE — Assessment & Plan Note (Signed)
Asymptomatic.  Throat and vaginal gonorrhea/chlamydia swabs performed at patient request.  HIV and RPR also performed.  She is up-to-date on her Pap smear.  Wet prep with some yeast, however exam and symptoms do not correlate, would not treat.

## 2020-08-25 NOTE — Progress Notes (Signed)
    SUBJECTIVE:   CHIEF COMPLAINT / HPI:   Contraceptive Counseling Sexually active: no Current contraception: occasional condoms Patient's last menstrual period was 07/29/2020 (exact date). Intercourse in the last 2 weeks: yes Attempting to conceive: yes Requests STI testing: yes, see below Last Pap: Last Pap smear on 01/17/2017, negative with negative high-risk HPV, due November 2023 Personal or family history of DVT/PE: none Has history of migraines, but no aura No smoking   STD testing Symptoms: none Has remote history of STIs She is sexually active and sometimes uses condoms.   Patient's last menstrual period was 07/29/2020 (exact date).  She does not douche. Desires HIV/RPR: yes Prior Hep C Ab: yes Throat or anal swab for G/C: would like throat swab   PERTINENT  PMH / PSH: History of migraine, asthma, chronic bilateral leg edema, obesity, depression with anxiety  OBJECTIVE:   BP 122/86   Pulse 91   Ht 5' (1.524 m)   Wt 209 lb 12.8 oz (95.2 kg)   LMP 07/29/2020 (Exact Date)   SpO2 100%   BMI 40.97 kg/m    Physical Exam: General: In NAD Respiratory: Breathing comfortably on room air GU: Pelvic exam performed with patient supine.  Chaperone in room.  Bilateral labia without abnormalities.  Cervix exhibits no discharge, no cervix abnormalities.  No vaginal lesions.  Vaginal discharge thin, clear.   Results for orders placed or performed in visit on 08/25/20 (from the past 24 hour(s))  POCT urine pregnancy     Status: None   Collection Time: 08/25/20  4:30 PM  Result Value Ref Range   Preg Test, Ur Negative Negative  POCT Wet Prep Lenard Forth [place])     Status: Abnormal   Collection Time: 08/25/20  4:35 PM  Result Value Ref Range   Source Wet Prep POC VAG    WBC, Wet Prep HPF POC NONE    Bacteria Wet Prep HPF POC Moderate (A) Few   Clue Cells Wet Prep HPF POC None None   Clue Cells Wet Prep Whiff POC Negative Whiff    Yeast Wet Prep HPF POC Few (A) None   KOH Wet  Prep POC Few (A) None   Trichomonas Wet Prep HPF POC Absent Absent      ASSESSMENT/PLAN:   Encounter for counseling regarding contraception The risks and benefits were dicussed with patient regarding the following forms of birth control: progesterone only oral contraceptive pills, Depo-Provera shot, Nuva-Ring, hormonal IUD, copper IUD, Nexplanon, and condoms.  The patient decided to proceed with Mirea.  She was counseled on possible side effects of this method.  She denies a history of migraines with aura, DVT, PE, and family history of DVT or PE.  She will return on 6/27 for Mirena insertion.  She was advised to use condoms for sexual intercourse until Mirena is placed.  Screen for STD (sexually transmitted disease) Asymptomatic.  Throat and vaginal gonorrhea/chlamydia swabs performed at patient request.  HIV and RPR also performed.  She is up-to-date on her Pap smear.  Wet prep with some yeast, however exam and symptoms do not correlate, would not treat.     Cleophas Dunker, [place]

## 2020-08-25 NOTE — Assessment & Plan Note (Signed)
The risks and benefits were dicussed with patient regarding the following forms of birth control: progesterone only oral contraceptive pills, Depo-Provera shot, Nuva-Ring, hormonal IUD, copper IUD, Nexplanon, and condoms.  The patient decided to proceed with Mirea.  She was counseled on possible side effects of this method.  She denies a history of migraines with aura, DVT, PE, and family history of DVT or PE.  She will return on 6/27 for Mirena insertion.  She was advised to use condoms for sexual intercourse until Mirena is placed.

## 2020-08-25 NOTE — Patient Instructions (Signed)
Thank you for coming to see me today. It was a pleasure. Today we talked about:   We performed STD testing today. This will take a few days to come back. If your MyChart is activated, we will message you on there if everything is normal, otherwise we will call. If we need to treat something we will also call you. If you do not hear from [place] in the next 4 days, please give [place] a call.   We have you scheduled for an IUD on 6/27.  If you have any questions or concerns, please do not hesitate to call the office at [phone].  Best,   [place] Constable, DO

## 2020-08-26 LAB — RPR: RPR Ser Ql: NONREACTIVE

## 2020-08-26 LAB — HIV ANTIBODY (ROUTINE TESTING W REFLEX): HIV Screen 4th Generation wRfx: NONREACTIVE

## 2020-08-29 ENCOUNTER — Encounter: Payer: Self-pay | Admitting: Family Medicine

## 2020-08-29 LAB — CERVICOVAGINAL ANCILLARY ONLY
Chlamydia: NEGATIVE
Chlamydia: NEGATIVE
Comment: NEGATIVE
Comment: NORMAL
Comment: NEGATIVE
Comment: NORMAL
Neisseria Gonorrhea: NEGATIVE
Neisseria Gonorrhea: NEGATIVE

## 2020-08-31 DIAGNOSIS — F331 Major depressive disorder, recurrent, moderate: Secondary | ICD-10-CM | POA: Diagnosis not present

## 2020-08-31 DIAGNOSIS — Z566 Other physical and mental strain related to work: Secondary | ICD-10-CM | POA: Diagnosis not present

## 2020-08-31 DIAGNOSIS — F4323 Adjustment disorder with mixed anxiety and depressed mood: Secondary | ICD-10-CM | POA: Diagnosis not present

## 2020-09-11 ENCOUNTER — Other Ambulatory Visit: Payer: Self-pay

## 2020-09-11 ENCOUNTER — Encounter: Payer: Self-pay | Admitting: Family Medicine

## 2020-09-11 ENCOUNTER — Ambulatory Visit (INDEPENDENT_AMBULATORY_CARE_PROVIDER_SITE_OTHER): Payer: BC Managed Care – PPO | Admitting: Family Medicine

## 2020-09-11 VITALS — BP 112/80 | HR 64 | Ht 60.0 in | Wt 208.0 lb

## 2020-09-11 DIAGNOSIS — Z3009 Encounter for other general counseling and advice on contraception: Secondary | ICD-10-CM | POA: Diagnosis not present

## 2020-09-11 DIAGNOSIS — Z30430 Encounter for insertion of intrauterine contraceptive device: Secondary | ICD-10-CM

## 2020-09-11 DIAGNOSIS — F418 Other specified anxiety disorders: Secondary | ICD-10-CM

## 2020-09-11 LAB — POCT URINE PREGNANCY: Preg Test, Ur: NEGATIVE

## 2020-09-11 MED ORDER — VENLAFAXINE HCL ER 75 MG PO CP24: 75.0000 mg | ORAL_CAPSULE | Freq: Every day | ORAL | 3 refills | Status: DC

## 2020-09-11 NOTE — Progress Notes (Signed)
    SUBJECTIVE:   CHIEF COMPLAINT / HPI:   IUD insertion Patient last seen on 6/10 for contraceptive counseling At that point, patient had opted to proceed with Mirena insertion STD testing was negative Pap smear due in November 2023 Would like Mirena placed today No unprotected intercourse in last two weeks  Anxiety with Depression Doing well Requesting effexor refill   PERTINENT  PMH / PSH: Migraine, mild intermittent asthma  OBJECTIVE:   BP 112/80   Pulse 64   Ht 5' (1.524 m)   Wt 208 lb (94.3 kg)   BMI 40.62 kg/m    Physical Exam: General: In NAD Respiratory: Breathing comfortably on room air GU: Pelvic exam performed with patient supine.  Chaperone in room.  Bilateral labia without abnormalities.  Cervix exhibits no discharge, no cervix abnormalities.  No vaginal lesions.  Vaginal discharge scant, white/clear.   Results for orders placed or performed in visit on 09/11/20 (from the past 24 hour(s))  POCT urine pregnancy     Status: None   Collection Time: 09/11/20  4:15 PM  Result Value Ref Range   Preg Test, Ur Negative Negative      IUD Insertion Procedure Note Patient identified, informed consent performed.  Discussed risks of irregular bleeding, cramping, infection, malpositioning or misplacement of the IUD outside the uterus which may require further procedure such as laparoscopy. Time out was performed.  Urine pregnancy test negative.  Speculum placed in the vagina.  Cervix visualized.  Cleaned with Betadine x 2.  Grasped anteriorly with a single tooth tenaculum.  Uterus sounded to 9 cm.  Mirena IUD placed per manufacturer's recommendations.  Strings trimmed to 3 cm. Tenaculum was removed, good hemostasis noted.  Patient tolerated procedure well.   Patient was given post-procedure instructions.  She was advised to be have backup contraception for one week.  Patient was also asked to check IUD strings periodically and follow up in 4 weeks for IUD check.    ASSESSMENT/PLAN:   Encounter for insertion of intrauterine contraceptive device (IUD) IUD placed per above without complication.  Return in 4 weeks for string check.  Given return precautions for infection.    Depression with anxiety Refill provided for Effexor.     Cleophas Dunker, [place]

## 2020-09-11 NOTE — Assessment & Plan Note (Signed)
IUD placed per above without complication.  Return in 4 weeks for string check.  Given return precautions for infection.

## 2020-09-11 NOTE — Patient Instructions (Signed)

## 2020-09-11 NOTE — Assessment & Plan Note (Signed)
Refill provided for Effexor.

## 2020-09-28 DIAGNOSIS — F4323 Adjustment disorder with mixed anxiety and depressed mood: Secondary | ICD-10-CM | POA: Diagnosis not present

## 2020-09-28 DIAGNOSIS — F331 Major depressive disorder, recurrent, moderate: Secondary | ICD-10-CM | POA: Diagnosis not present

## 2020-09-28 DIAGNOSIS — F900 Attention-deficit hyperactivity disorder, predominantly inattentive type: Secondary | ICD-10-CM | POA: Diagnosis not present

## 2020-09-28 DIAGNOSIS — Z566 Other physical and mental strain related to work: Secondary | ICD-10-CM | POA: Diagnosis not present

## 2020-09-30 ENCOUNTER — Encounter: Payer: Self-pay | Admitting: Family Medicine

## 2020-10-02 MED ORDER — LEVONORGESTREL 20 MCG/DAY IU IUD
1.0000 | INTRAUTERINE_SYSTEM | Freq: Once | INTRAUTERINE | Status: AC
  Administered 2020-09-11: 1 via INTRAUTERINE

## 2020-10-02 NOTE — Telephone Encounter (Signed)
Spoke with patient. Patient reports she has been on her cycle for 8 days now. Patient reports cramping and heavier bleeding than usual. Patient denies going through multiple pads or tampons a day. Patient denies dizziness or fatigue. Patient has an apt scheduled for 7/25 with PCP.

## 2020-10-02 NOTE — Addendum Note (Signed)
Addended by: Talbot Grumbling on: 10/02/2020 04:28 PM   Modules accepted: Orders

## 2020-10-09 ENCOUNTER — Ambulatory Visit: Payer: BC Managed Care – PPO | Admitting: Family Medicine

## 2020-10-09 ENCOUNTER — Encounter: Payer: Self-pay | Admitting: Family Medicine

## 2020-10-09 ENCOUNTER — Other Ambulatory Visit: Payer: Self-pay

## 2020-10-09 VITALS — Ht 60.0 in | Wt 210.4 lb

## 2020-10-09 DIAGNOSIS — Z975 Presence of (intrauterine) contraceptive device: Secondary | ICD-10-CM | POA: Diagnosis not present

## 2020-10-09 NOTE — Patient Instructions (Signed)
It was great to meet you!  -Your IUD strings are visible in the proper location -If your bleeding persists past the end of August, please let us know  Take care and seek immediate care sooner if you develop any concerns.  Dr. [facility]

## 2020-10-09 NOTE — Progress Notes (Signed)
    SUBJECTIVE:   CHIEF COMPLAINT / HPI:   IUD check Patient presents for string check. Had Mirena IUD placed on 09/11/2020. Reports cramping immediately after placement which resolved after a few days. She complains of spotting that started July 7th and hasn't stopped since. She has had spotting every day aside from the 20th, 21st. Not as heavy as a period. Associated occasional cramping which resolves with Midol or Ibuprofen. Previously not on birth control, but used the Nuvaring a long time ago  PERTINENT  PMH / PSH: Depression, anxiety, migraines, obesity  OBJECTIVE:   Ht 5' (1.524 m)   Wt 210 lb 6.4 oz (95.4 kg)   BMI 41.09 kg/m   General: NAD, pleasant, able to participate in exam Respiratory: No respiratory distress Skin: warm and dry, no rashes noted Psych: Normal affect and mood Neuro: grossly intact GU/GYN: Exam performed in the presence of a chaperone. External genitalia within normal limits.  Vaginal mucosa pink, moist, normal rugae.  Nonfriable cervix without lesions. IUD strings visualized in proper position, scant amount of bleeding noted on speculum exam.   ASSESSMENT/PLAN:   IUD (intrauterine device) in place IUD strings visualized on speculum exam today. Discussed spotting is common after IUD placement and may persist for a few more weeks. Educated on return precautions    Alcus Dad, MD [place]

## 2020-10-10 DIAGNOSIS — F4323 Adjustment disorder with mixed anxiety and depressed mood: Secondary | ICD-10-CM | POA: Diagnosis not present

## 2020-10-10 DIAGNOSIS — Z975 Presence of (intrauterine) contraceptive device: Secondary | ICD-10-CM | POA: Insufficient documentation

## 2020-10-10 DIAGNOSIS — Z566 Other physical and mental strain related to work: Secondary | ICD-10-CM | POA: Diagnosis not present

## 2020-10-10 NOTE — Assessment & Plan Note (Signed)
IUD strings visualized on speculum exam today. Discussed spotting is common after IUD placement and may persist for a few more weeks. Educated on return precautions

## 2020-10-23 ENCOUNTER — Other Ambulatory Visit: Payer: Self-pay | Admitting: Family Medicine

## 2020-10-23 DIAGNOSIS — F331 Major depressive disorder, recurrent, moderate: Secondary | ICD-10-CM | POA: Diagnosis not present

## 2020-10-23 DIAGNOSIS — F900 Attention-deficit hyperactivity disorder, predominantly inattentive type: Secondary | ICD-10-CM | POA: Diagnosis not present

## 2020-10-26 DIAGNOSIS — F4323 Adjustment disorder with mixed anxiety and depressed mood: Secondary | ICD-10-CM | POA: Diagnosis not present

## 2020-10-26 DIAGNOSIS — Z566 Other physical and mental strain related to work: Secondary | ICD-10-CM | POA: Diagnosis not present

## 2020-10-27 ENCOUNTER — Other Ambulatory Visit: Payer: Self-pay | Admitting: Family Medicine

## 2020-10-27 DIAGNOSIS — G43709 Chronic migraine without aura, not intractable, without status migrainosus: Secondary | ICD-10-CM

## 2020-11-09 DIAGNOSIS — F4323 Adjustment disorder with mixed anxiety and depressed mood: Secondary | ICD-10-CM | POA: Diagnosis not present

## 2020-11-09 DIAGNOSIS — Z566 Other physical and mental strain related to work: Secondary | ICD-10-CM | POA: Diagnosis not present

## 2020-11-17 DIAGNOSIS — F331 Major depressive disorder, recurrent, moderate: Secondary | ICD-10-CM | POA: Diagnosis not present

## 2020-11-17 DIAGNOSIS — F900 Attention-deficit hyperactivity disorder, predominantly inattentive type: Secondary | ICD-10-CM | POA: Diagnosis not present

## 2020-11-23 DIAGNOSIS — F4323 Adjustment disorder with mixed anxiety and depressed mood: Secondary | ICD-10-CM | POA: Diagnosis not present

## 2020-11-23 DIAGNOSIS — Z566 Other physical and mental strain related to work: Secondary | ICD-10-CM | POA: Diagnosis not present

## 2020-11-24 DIAGNOSIS — F331 Major depressive disorder, recurrent, moderate: Secondary | ICD-10-CM | POA: Diagnosis not present

## 2020-11-24 DIAGNOSIS — F900 Attention-deficit hyperactivity disorder, predominantly inattentive type: Secondary | ICD-10-CM | POA: Diagnosis not present

## 2020-12-14 DIAGNOSIS — F4323 Adjustment disorder with mixed anxiety and depressed mood: Secondary | ICD-10-CM | POA: Diagnosis not present

## 2020-12-14 DIAGNOSIS — Z566 Other physical and mental strain related to work: Secondary | ICD-10-CM | POA: Diagnosis not present

## 2020-12-20 ENCOUNTER — Telehealth (INDEPENDENT_AMBULATORY_CARE_PROVIDER_SITE_OTHER): Payer: BC Managed Care – PPO | Admitting: Family Medicine

## 2020-12-20 ENCOUNTER — Other Ambulatory Visit: Payer: Self-pay

## 2020-12-20 DIAGNOSIS — F430 Acute stress reaction: Secondary | ICD-10-CM

## 2020-12-20 NOTE — Progress Notes (Signed)
[facility] Visit  Patient consented to have virtual visit and was identified by name and date of birth. Method of visit: Telephone  Encounter participants: Patient: Jacqueline Woodard - located at home Provider: Lattie Haw - located at home Others (if applicable):   Chief Complaint:   HPI:  Stress  Symptoms started 3-4 days ago with feeling clammy, poor sleep, lightheaded, nausea, poor appetite, exhausted etc. No sick contacts. She has taken home covid tests which have been all negative. Denies cough, runny nose, fevers, sore throat, dyspnea, chest pain, palpitations, lower abdominal pain, dysuria, frequency, urgency, hematuria or menorrhagia. Sexually active with one female, IUD in place since June. Denies chance of pregnancy.  Pt's reports her job has become more stressful recently. She works in [place] at [employer] care facility. Most recent BP at work the other day was 144/98.  ROS: per HPI  Pertinent PMHx: Depression, anxiety   Exam:  There were no vitals taken for this visit.  Respiratory: speaking in full sentences   Assessment/Plan:  Stress reaction Acute stress reaction likely from recent changes at work. Low suspicion for organic cause of her symptoms. Recommended strategies for stress management such as exercise, meditation/breathing exercises, calling friends/family etc. Offered therapy however pt is already in therapy. Speaking with her therapist about this would be useful. Pt is requesting a few days off so I have provided a work letter for her. Follow up in 1-2 weeks if no improvement in symptoms.    Time spent during visit with patient: 13 minutes

## 2020-12-20 NOTE — Assessment & Plan Note (Addendum)
Acute stress reaction likely from recent changes at work. Low suspicion for organic cause of her symptoms. Recommended strategies for stress management such as exercise, meditation/breathing exercises, calling friends/family etc. Offered therapy however pt is already in therapy. Speaking with her therapist about this would be useful. Pt is requesting a few days off so I have provided a work letter for her. Follow up in 1-2 weeks if no improvement in symptoms.

## 2020-12-21 DIAGNOSIS — Z566 Other physical and mental strain related to work: Secondary | ICD-10-CM | POA: Diagnosis not present

## 2020-12-21 DIAGNOSIS — F4323 Adjustment disorder with mixed anxiety and depressed mood: Secondary | ICD-10-CM | POA: Diagnosis not present

## 2020-12-22 DIAGNOSIS — F331 Major depressive disorder, recurrent, moderate: Secondary | ICD-10-CM | POA: Diagnosis not present

## 2020-12-22 DIAGNOSIS — F900 Attention-deficit hyperactivity disorder, predominantly inattentive type: Secondary | ICD-10-CM | POA: Diagnosis not present

## 2021-01-01 ENCOUNTER — Ambulatory Visit: Payer: BC Managed Care – PPO | Admitting: Family Medicine

## 2021-01-01 ENCOUNTER — Encounter: Payer: Self-pay | Admitting: Family Medicine

## 2021-01-01 ENCOUNTER — Other Ambulatory Visit: Payer: Self-pay

## 2021-01-01 DIAGNOSIS — R5383 Other fatigue: Secondary | ICD-10-CM | POA: Diagnosis not present

## 2021-01-01 NOTE — Assessment & Plan Note (Signed)
I will keep a broad diff at this point.  From the medical standpoint, anemia and hypothyroidism are in the differential.  With the rapid 20 lb weight loss, acute malnutrition possible as would be fluid and electrolyte disturbance.  Labs ordered.  More likely the cause is stress related.  I am not certain that I fully know her underlying psychiatric condition.  Given her multiple meds, I suspect it is clinically important.  At the end of the visit, I was surprised to learn that  Her therapist had suggested that she take a leave from work and that her PCP fill out the paperwork.  She and I agreed that if the mental health specialist had suggested a leave for mental health reasons, that provider would best fill out the leave related paperwork.

## 2021-01-01 NOTE — Patient Instructions (Signed)
I will call with the blood test results.   If the blood test results are reassuring, then I believe that the most likely cause is from your emotions.   About the leave of absence paperwork, I believe that is best done by the specialist who is recommending the time off.  We will help you with it if we need to.  Consider [place] your backup plan.   Next time your see the psychiatrist, ask about being on both lamictal and tegretol.

## 2021-01-01 NOTE — Progress Notes (Signed)
    SUBJECTIVE:   CHIEF COMPLAINT / HPI:   Marked fatique.  Patient has been exhausted for 3 weeks - fatigue that sleep does not seem to help.  She did have some nausea at the start of this problem.  The nausea has resolved and the fatigue remain.  She does not feel pregnant and has an IUD in place.  Other than the transient nausea, she has no focal symptoms.  Specifically denies ST, fever, chills, cough, SOB, CP, change in bowel or bladder.  Of note, she has had a 20 lb weight loss since July.  This has been mostly intentional and she was begun on adderall by her psychiatrist.  She does admit to stress.  Mostly work related although her son is also adding to it.      OBJECTIVE:   BP 120/88   Pulse 100   Ht 5' (1.524 m)   Wt 191 lb 6.4 oz (86.8 kg)   LMP 12/28/2020 (Exact Date)   SpO2 100%   BMI 37.38 kg/m   Neck, normal thyroid Lungs clear Cardiac RRR without m or g Abd benign. Skin - does not seem pale or anemic  ASSESSMENT/PLAN:   Fatigue I will keep a broad diff at this point.  From the medical standpoint, anemia and hypothyroidism are in the differential.  With the rapid 20 lb weight loss, acute malnutrition possible as would be fluid and electrolyte disturbance.  Labs ordered.  More likely the cause is stress related.  I am not certain that I fully know her underlying psychiatric condition.  Given her multiple meds, I suspect it is clinically important.  At the end of the visit, I was surprised to learn that  Her therapist had suggested that she take a leave from work and that her PCP fill out the paperwork.  She and I agreed that if the mental health specialist had suggested a leave for mental health reasons, that provider would best fill out the leave related paperwork.     Zenia Resides, MD [place]

## 2021-01-02 LAB — CMP14+EGFR
ALT: 5 IU/L (ref 0–32)
AST: 21 IU/L (ref 0–40)
Albumin/Globulin Ratio: 1.6 (ref 1.2–2.2)
Albumin: 4.1 g/dL (ref 3.8–4.8)
Alkaline Phosphatase: 145 IU/L — ABNORMAL HIGH (ref 44–121)
BUN/Creatinine Ratio: 7 — ABNORMAL LOW (ref 9–23)
BUN: 8 mg/dL (ref 6–20)
Bilirubin Total: 0.3 mg/dL (ref 0.0–1.2)
CO2: 20 mmol/L (ref 20–29)
Calcium: 9 mg/dL (ref 8.7–10.2)
Chloride: 105 mmol/L (ref 96–106)
Creatinine, Ser: 1.15 mg/dL — ABNORMAL HIGH (ref 0.57–1.00)
Globulin, Total: 2.5 g/dL (ref 1.5–4.5)
Glucose: 125 mg/dL — ABNORMAL HIGH (ref 70–99)
Potassium: 3.7 mmol/L (ref 3.5–5.2)
Sodium: 140 mmol/L (ref 134–144)
Total Protein: 6.6 g/dL (ref 6.0–8.5)
eGFR: 63 mL/min/{1.73_m2} (ref >59)

## 2021-01-02 LAB — TSH: TSH: 1.24 u[IU]/mL (ref 0.450–4.500)

## 2021-01-02 LAB — CBC
Hematocrit: 38.8 % (ref 34.0–46.6)
Hemoglobin: 12.5 g/dL (ref 11.1–15.9)
MCH: 26.4 pg — ABNORMAL LOW (ref 26.6–33.0)
MCHC: 32.2 g/dL (ref 31.5–35.7)
MCV: 82 fL (ref 79–97)
Platelets: 339 10*3/uL (ref 150–450)
RBC: 4.74 x10E6/uL (ref 3.77–5.28)
RDW: 12.9 % (ref 11.7–15.4)
WBC: 7.1 10*3/uL (ref 3.4–10.8)

## 2021-01-04 ENCOUNTER — Other Ambulatory Visit: Payer: Self-pay | Admitting: Family Medicine

## 2021-01-04 DIAGNOSIS — F4323 Adjustment disorder with mixed anxiety and depressed mood: Secondary | ICD-10-CM | POA: Diagnosis not present

## 2021-01-04 DIAGNOSIS — Z566 Other physical and mental strain related to work: Secondary | ICD-10-CM | POA: Diagnosis not present

## 2021-01-22 DIAGNOSIS — F4323 Adjustment disorder with mixed anxiety and depressed mood: Secondary | ICD-10-CM | POA: Diagnosis not present

## 2021-01-22 DIAGNOSIS — Z566 Other physical and mental strain related to work: Secondary | ICD-10-CM | POA: Diagnosis not present

## 2021-01-25 ENCOUNTER — Telehealth: Payer: Self-pay

## 2021-01-25 DIAGNOSIS — J4520 Mild intermittent asthma, uncomplicated: Secondary | ICD-10-CM

## 2021-01-25 NOTE — Telephone Encounter (Signed)
Received fax from [facility]: Insurance requires a 90 day supply of Symbicort. Please resend Rx.  Ottis Stain, CMA

## 2021-01-30 MED ORDER — BUDESONIDE-FORMOTEROL FUMARATE 80-4.5 MCG/ACT IN AERO: INHALATION_SPRAY | RESPIRATORY_TRACT | 3 refills | Status: DC

## 2021-01-30 NOTE — Addendum Note (Signed)
Addended by: Alcus Dad on: 01/30/2021 09:24 AM   Modules accepted: Orders

## 2021-01-30 NOTE — Telephone Encounter (Signed)
Symbicort sent to pharmacy as requested. Sent largest inhaler size with 3 refills.

## 2021-02-05 DIAGNOSIS — F900 Attention-deficit hyperactivity disorder, predominantly inattentive type: Secondary | ICD-10-CM | POA: Diagnosis not present

## 2021-02-05 DIAGNOSIS — F331 Major depressive disorder, recurrent, moderate: Secondary | ICD-10-CM | POA: Diagnosis not present

## 2021-02-12 DIAGNOSIS — Z566 Other physical and mental strain related to work: Secondary | ICD-10-CM | POA: Diagnosis not present

## 2021-02-12 DIAGNOSIS — F4323 Adjustment disorder with mixed anxiety and depressed mood: Secondary | ICD-10-CM | POA: Diagnosis not present

## 2021-02-22 ENCOUNTER — Ambulatory Visit: Payer: BC Managed Care – PPO | Admitting: Family Medicine

## 2021-03-05 DIAGNOSIS — F331 Major depressive disorder, recurrent, moderate: Secondary | ICD-10-CM | POA: Diagnosis not present

## 2021-03-05 DIAGNOSIS — F900 Attention-deficit hyperactivity disorder, predominantly inattentive type: Secondary | ICD-10-CM | POA: Diagnosis not present

## 2021-03-14 DIAGNOSIS — Z566 Other physical and mental strain related to work: Secondary | ICD-10-CM | POA: Diagnosis not present

## 2021-03-14 DIAGNOSIS — F4323 Adjustment disorder with mixed anxiety and depressed mood: Secondary | ICD-10-CM | POA: Diagnosis not present

## 2021-03-16 ENCOUNTER — Other Ambulatory Visit: Payer: Self-pay

## 2021-03-16 ENCOUNTER — Encounter: Payer: Self-pay | Admitting: Family Medicine

## 2021-03-16 ENCOUNTER — Ambulatory Visit: Payer: BC Managed Care – PPO | Admitting: Family Medicine

## 2021-03-16 ENCOUNTER — Other Ambulatory Visit (HOSPITAL_COMMUNITY)
Admission: RE | Admit: 2021-03-16 | Discharge: 2021-03-16 | Disposition: A | Discharge status: Home / Self Care | Payer: BC Managed Care – PPO | Source: Ambulatory Visit | Attending: Family Medicine | Admitting: Family Medicine

## 2021-03-16 VITALS — BP 117/71 | HR 94 | Ht 60.0 in | Wt 190.4 lb

## 2021-03-16 DIAGNOSIS — Z6835 Body mass index (BMI) 35.0-35.9, adult: Secondary | ICD-10-CM

## 2021-03-16 DIAGNOSIS — E6609 Other obesity due to excess calories: Secondary | ICD-10-CM

## 2021-03-16 DIAGNOSIS — R7989 Other specified abnormal findings of blood chemistry: Secondary | ICD-10-CM

## 2021-03-16 DIAGNOSIS — J4520 Mild intermittent asthma, uncomplicated: Secondary | ICD-10-CM

## 2021-03-16 DIAGNOSIS — R7309 Other abnormal glucose: Secondary | ICD-10-CM

## 2021-03-16 DIAGNOSIS — Z113 Encounter for screening for infections with a predominantly sexual mode of transmission: Secondary | ICD-10-CM | POA: Diagnosis not present

## 2021-03-16 DIAGNOSIS — G43709 Chronic migraine without aura, not intractable, without status migrainosus: Secondary | ICD-10-CM

## 2021-03-16 MED ORDER — TOPIRAMATE 25 MG PO TABS: 25.0000 mg | ORAL_TABLET | Freq: Two times a day (BID) | ORAL | 1 refills | Status: DC

## 2021-03-16 MED ORDER — ONDANSETRON HCL 4 MG PO TABS: 4.0000 mg | ORAL_TABLET | Freq: Three times a day (TID) | ORAL | 0 refills | Status: DC | PRN

## 2021-03-16 MED ORDER — ALBUTEROL SULFATE HFA 108 (90 BASE) MCG/ACT IN AERS: INHALATION_SPRAY | RESPIRATORY_TRACT | 2 refills | Status: DC

## 2021-03-16 NOTE — Progress Notes (Signed)
b   SUBJECTIVE:   CHIEF COMPLAINT / HPI:   Blood work follow-up Glucose was elevated on CMP in October (glucose 125) Given hx of obesity and abnormal glucose, the provider recommended she return for diabetes screening. She also has a family hx of diabetes on both sides of her family (dad, several uncles, grandparents). Last A1c in 2018 was 5.5% Creatinine was also slightly elevated to 1.15 in October and alk phos slightly elevated to 145 so it was recommended to repeat these as well.  STD testing Patient requests routine STI testing. She is asymptomatic (no vaginal discharge or discomfort, no dysuria, no frequency, no pain w/intercourse, etc). Sexually active w/one female partner. Has an IUD and uses condoms reliably. Hx of trichomonas years and years ago (maybe 28), but no other STDs No new sexual partners, no known STD exposures.  PERTINENT  PMH / PSH: depression, anxiety, obesity, migraines  OBJECTIVE:   BP 117/71    Pulse 94    Ht 5' (1.524 m)    Wt 190 lb 6 oz (86.4 kg)    LMP 02/28/2021    BMI 37.18 kg/m   General: NAD, pleasant, able to participate in exam Respiratory: No respiratory distress Skin: warm and dry, no rashes noted Psych: Normal affect and mood Neuro: grossly intact GU/GYN: Exam performed in the presence of a chaperone. External genitalia within normal limits.  Vaginal mucosa pink, moist, normal rugae.  Nonfriable cervix without lesions, no discharge or bleeding noted on speculum exam.  IUD strings visualized.   ASSESSMENT/PLAN:   Routine STD Testing Asymptomatic. No known exposures. Pelvic exam wnl today. GC/chlamydia/trichomonas obtained. Blood work for HIV, RPR, and Hep C also obtained per her request. Encouraged ongoing condom use in addition to her IUD.  Abnormal glucose Glucose 125 on CMP in October 2022. Unclear whether this was fasting. Given her elevated glucose as well as obesity and family history of diabetes, will obtain screening A1c  today.  Elevated Creatinine Cr mildly elevated to 1.15 on CMP in October 2022. No hx of renal disease. Takes topamax 49m BID for migraine prophylaxis but no -Repeat CMP today  Elevated Alk Phos Alk phos slightly elevated to 145 on CMP, which is fairly nonspecific. No gallbladder symptoms or otherwise. Her alk phos has also been elevated slightly in the past. -Repeat CMP today   Med Refills Refills sent on Albuterol, Zofran, and Topamax per request.   AAlcus Dad MD [place]

## 2021-03-16 NOTE — Patient Instructions (Signed)
It was great to see you!  -Today we did routine testing for STDs. This includes gonorrhea, chlamydia, trichomonas, HIV, syphilis, and Hepatitis. I will send you a MyChart message with the results or call if they are abnormal.  -We are also screening for diabetes and rechecking your kidney function. Again, I will send you a MyChart message with the results or call if they are abnormal.  Take care and seek immediate care sooner if you develop any concerns.  Dr. [facility]

## 2021-03-16 NOTE — Assessment & Plan Note (Signed)
Glucose 125 on CMP in October 2022. Unclear whether this was fasting. Given her elevated glucose as well as obesity and family history of diabetes, will obtain screening A1c today.

## 2021-03-17 LAB — HCV AB W REFLEX TO QUANT PCR: HCV Ab: <0.1 s/co ratio (ref 0.0–0.9)

## 2021-03-17 LAB — HCV INTERPRETATION

## 2021-03-17 LAB — COMPREHENSIVE METABOLIC PANEL
ALT: 6 IU/L (ref 0–32)
AST: 16 IU/L (ref 0–40)
Albumin/Globulin Ratio: 1.6 (ref 1.2–2.2)
Albumin: 4.1 g/dL (ref 3.8–4.8)
Alkaline Phosphatase: 138 IU/L — ABNORMAL HIGH (ref 44–121)
BUN/Creatinine Ratio: 9 (ref 9–23)
BUN: 10 mg/dL (ref 6–20)
Bilirubin Total: 0.3 mg/dL (ref 0.0–1.2)
CO2: 20 mmol/L (ref 20–29)
Calcium: 8.9 mg/dL (ref 8.7–10.2)
Chloride: 105 mmol/L (ref 96–106)
Creatinine, Ser: 1.08 mg/dL — ABNORMAL HIGH (ref 0.57–1.00)
Globulin, Total: 2.6 g/dL (ref 1.5–4.5)
Glucose: 61 mg/dL — ABNORMAL LOW (ref 70–99)
Potassium: 3.5 mmol/L (ref 3.5–5.2)
Sodium: 142 mmol/L (ref 134–144)
Total Protein: 6.7 g/dL (ref 6.0–8.5)
eGFR: 68 mL/min/{1.73_m2} (ref >59)

## 2021-03-17 LAB — HIV ANTIBODY (ROUTINE TESTING W REFLEX): HIV Screen 4th Generation wRfx: NONREACTIVE

## 2021-03-17 LAB — HEMOGLOBIN A1C
Est. average glucose Bld gHb Est-mCnc: 117 mg/dL
Hgb A1c MFr Bld: 5.7 % — ABNORMAL HIGH (ref 4.8–5.6)

## 2021-03-17 LAB — RPR: RPR Ser Ql: NONREACTIVE

## 2021-03-20 LAB — CERVICOVAGINAL ANCILLARY ONLY
Chlamydia: NEGATIVE
Comment: NEGATIVE
Comment: NEGATIVE
Comment: NORMAL
Neisseria Gonorrhea: NEGATIVE
Trichomonas: NEGATIVE

## 2021-04-30 DIAGNOSIS — F900 Attention-deficit hyperactivity disorder, predominantly inattentive type: Secondary | ICD-10-CM | POA: Diagnosis not present

## 2021-04-30 DIAGNOSIS — F331 Major depressive disorder, recurrent, moderate: Secondary | ICD-10-CM | POA: Diagnosis not present

## 2021-05-02 ENCOUNTER — Other Ambulatory Visit: Payer: Self-pay | Admitting: Family Medicine

## 2021-05-02 DIAGNOSIS — J4520 Mild intermittent asthma, uncomplicated: Secondary | ICD-10-CM

## 2021-05-15 ENCOUNTER — Other Ambulatory Visit: Payer: Self-pay

## 2021-05-15 ENCOUNTER — Ambulatory Visit (INDEPENDENT_AMBULATORY_CARE_PROVIDER_SITE_OTHER): Payer: BC Managed Care – PPO | Admitting: Student

## 2021-05-15 VITALS — BP 132/84 | HR 106 | Wt 188.4 lb

## 2021-05-15 DIAGNOSIS — Z6841 Body Mass Index (BMI) 40.0 and over, adult: Secondary | ICD-10-CM | POA: Diagnosis not present

## 2021-05-15 DIAGNOSIS — E66813 Obesity, class 3: Secondary | ICD-10-CM

## 2021-05-15 NOTE — Patient Instructions (Addendum)
It was great seeing you today.  Continue the great work with your diet and exercise changes! Continue to follow with your psychiatrist and therapist. If you ever have thoughts of hurting yourself, call 37 (suicide hotline) or 911.  We will plan to see you again next year unless you have any other concerns.   If you have any questions or concerns, please feel free to call the clinic.    Be well,  Dr. Orvis Brill [facility] [phone]

## 2021-05-15 NOTE — Progress Notes (Addendum)
° ° °  SUBJECTIVE:   Chief compliant/HPI: annual examination  Jacqueline Woodard is a 36 y.o. who presents today for an annual exam.   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 She has a 45 year-old son who she just adopted.   She has been cutting back on portion sizes and tries to make her snacks a fruit or vegetable. She has increasing water and cutting out juice.  Medical hx: No changes- Asthma, Migraines, ADD, Anxiety, Depression Social hx: No cigarette use. No alcohol use. No marijuana or ilicit drug use. Surgical hx: No changes Allergies: Seasonal  Review of systems form notable for no fever, chills, nausea, vomiting, diarrhea.   Updated history tabs and problem list .   OBJECTIVE:   BP 132/84    Pulse (!) 106    Wt 188 lb 6.4 oz (85.5 kg)    LMP 04/14/2021    SpO2 100%    BMI 36.79 kg/m   General: Well-appearing, pleasant HEENT: Pupils PERRLA, EOMI. MMM CV: Regular rate and rhythm, no murmurs appreciated. HR on my exam in 80s, although initially tachycardic upon arrival to office. Resp: Normal work of breathing on room air. Lungs clear to auscultation with no wheezing or crackles. Abdomen: Soft, non-tender, non-distended. Bowel sounds normoactive Ext: No edema Skin: Warm and dry Psych: Pleasant. Normal mood and affect.  ASSESSMENT/PLAN:   Annual Examination  Patient doing generally well. PHQ score 9, reviewed and discussed. No SI, #9 negative.Feels mood has been down lately due to work and a few life stressors. Plans to call therapist and has good follow-up with psychiatrist. Provided patient with 67 suicide hotline and return precautions. Blood pressure reviewed and at goal.  The patient currently uses IUD for contraception with condoms. Denied current sexual activity or desire for STI testing.  Lipid panel (nonfasting or fasting) discussed based upon AHA recommendations and ordered, given prior history of elevated cholesterol and LDL. Although, patient is young  and would likely not initiate statin given patient's age.  Follow up in 1  year or sooner if indicated.    Orvis Brill, [place]

## 2021-05-16 LAB — LIPID PANEL
Chol/HDL Ratio: 3.8 ratio (ref 0.0–4.4)
Cholesterol, Total: 179 mg/dL (ref 100–199)
HDL: 47 mg/dL (ref >39)
LDL Chol Calc (NIH): 107 mg/dL — ABNORMAL HIGH (ref 0–99)
Triglycerides: 139 mg/dL (ref 0–149)
VLDL Cholesterol Cal: 25 mg/dL (ref 5–40)

## 2021-05-18 ENCOUNTER — Other Ambulatory Visit: Payer: Self-pay

## 2021-05-18 ENCOUNTER — Encounter: Payer: Self-pay | Admitting: Student

## 2021-05-18 ENCOUNTER — Ambulatory Visit: Payer: BC Managed Care – PPO | Admitting: Student

## 2021-05-18 VITALS — BP 115/88 | HR 94 | Temp 98.6°F | Wt 186.0 lb

## 2021-05-18 DIAGNOSIS — J069 Acute upper respiratory infection, unspecified: Secondary | ICD-10-CM | POA: Diagnosis not present

## 2021-05-18 DIAGNOSIS — F418 Other specified anxiety disorders: Secondary | ICD-10-CM

## 2021-05-18 DIAGNOSIS — J370 Chronic laryngitis: Secondary | ICD-10-CM | POA: Insufficient documentation

## 2021-05-18 DIAGNOSIS — Z23 Encounter for immunization: Secondary | ICD-10-CM | POA: Diagnosis not present

## 2021-05-18 NOTE — Assessment & Plan Note (Signed)
Patient with 3-4 days of cough, congestion, and sore throat. Patient denies any other sick symptoms and has normal activity level/diet. Patient likely suffering from URI with virus, causing cold symptoms. Less concerned for Strep Throat given age, lack of fevers, and physical exam. Also considered allergies, given patient has history of taking Claritin, but has only been taking as needed. Patient could also be suffering from sinus infection, but less likely given symptoms and lack of exam findings. Recommend symptom management, restarting allergy meds, and 2 week follow up for worsening/continued symptoms ?-Symptom Management ?-Return precautions reviewed ?-Restart Claritin ?-Follow up in 2 wk if worse/not better ?

## 2021-05-18 NOTE — Assessment & Plan Note (Signed)
Patientscreend 15 for PHQ-9 today. Says she has personal stressors going on, but they are improving and her mood is improving. She sees a therapist and psychiatrist, and is one meds (lamitctal, effexor). Patient planning to follow up with therapist as she hasn't seen them since [place], since things have been good. ?-Follow up with therapist ?-Continue home meds ?

## 2021-05-18 NOTE — Addendum Note (Signed)
Addended by: Katharina Caper, Louisa Favaro D on: 05/18/2021 10:48 AM ? ? Modules accepted: Orders ? ?

## 2021-05-18 NOTE — Patient Instructions (Signed)
It was great to see you! Thank you for allowing me to participate in your care! ? ?It sounds like you have a cold, causing upper respiratory symptoms (Head Cold). This is self limited and should resolve on its own. This also could be caused by your allergies, so restart your allergy medicine.  ? ?Our plans for today:  ?- Symptom management: try honey/tea to help with sore throat, keep hydrated ?- Return precautions: ? Return if you are starting to have ?- Fevers (temp greater than or equal to 100.4) ?- Facial pain/increased pressure ?- Throat swelling ?- Develop more symptoms ?-Allergy meds ? -Restart your Claritin and take it daily for the next 30 days (to see if still working) ?-Follow up ?-Make a follow up appointment in 2 weeks if symptoms persist or get worse. Make an appointment sooner if new symptoms or new concerns ? ?Take care and seek immediate care sooner if you develop any concerns.  ? ?Dr. Holley Bouche, MD ?[place] ? ?

## 2021-05-18 NOTE — Progress Notes (Signed)
?SUBJECTIVE:  ? ?CHIEF COMPLAINT / HPI:  ? ?Cough ?Sore throat started Tuesday night, got worse, causing her to cough/choke. Throat has also felt scratchy. Ears feel clogged today, like wet. No fever, chills. Cough started Wednesday, having fits of coughing, cauing her to get chocked up, lasting 45 sec or so. Coughing fit 2-3 times in an hour. No leakage from ears, hearing intact. No sick contacts. Has had COVID twice, last year sometime. Covid test on Wednesday that was negative. No fever, nausea, vomiting, diarrhea, no SOB or chest pain, no belly pain. Feeling like normal self, no malaise, eating and drinking ok, no issues swallowing.  ? ?PHQ-9 scored 15 for moderately severe depression ?Goes through spells, and sees a therapist. Does virtual visits with them. Had been doing good for a while, saw them in mid January. Has personal things going on, but is getting through it and things are improving. Takes meds, and has a psychiatrist. Is on Effexor, and Lamictal and ativan as needed. Doesn't want any resources today.  ? ?T-Dap Vaccine today ? ?PERTINENT  PMH / PSH:  ? ?Past Medical History:  ?Diagnosis Date  ? Chronic migraine   ? Endometrial polyp   ? GERD (gastroesophageal reflux disease)   ? Hereditary lymphedema of legs   ? chronic --- right > left  ? History of acute renal failure   ? in setting cellulitis left lower leg 06/ 2008 and right lower leg cellulitis with sepsis  ? History of borderline diabetes mellitus   ? 08-29-2017 PER PT DX 2016 was checked again last year no longer pre-diabetic  ? History of cellulitis   ? 06/ 2008  left lower leg cellulitis:  12-05-2013  and 06-04-2014-- right lower leg cellulitis w/ sepsis  ? History of Clostridium difficile infection 08/2006  ? History of gastric ulcer 2015  ? History of ovarian cyst   ? History of sepsis   ? w/ right lower leg cellulitis 12-05-2013 and 06-04-2014  ? Microcytic anemia   ? Mild asthma   ? INHALER PRN-- LAST USED 2014  ? Uterine fibroid   ?  Wears glasses   ? ? ?Past Surgical History:  ?Procedure Laterality Date  ? BREAST REDUCTION SURGERY Bilateral 03/01/2013  ? Procedure: BILATERAL MAMMARY REDUCTION  (BREAST);  Surgeon: Cristine Polio, MD;  Location: [facility];  Service: Plastics;  Laterality: Bilateral;  ? BREAST SURGERY Bilateral 2015  ? Breast reduction  ? DILATATION & CURETTAGE/HYSTEROSCOPY WITH MYOSURE N/A 09/05/2017  ? Procedure: [place];  Surgeon: Anastasio Auerbach, MD;  Location: [facility];  Service: Gynecology;  Laterality: N/A;  requests 7:30am OR time ?Requests one hour  ? DILATION AND CURETTAGE OF UTERUS  09/05/2017  ? Hyperplastic uterus, uterine polyp removal, irregular bleeding  ? WISDOM TOOTH EXTRACTION    ? ? ?OBJECTIVE:  ?BP 115/88   Pulse 94   Temp 98.6 ?F (37 ?C) (Oral)   Wt 186 lb (84.4 kg)   LMP 05/15/2021   SpO2 100%   BMI 36.33 kg/m?  ? ? ?Physical Exam ?HENT:  ?   Right Ear: Tympanic membrane, ear canal and external ear normal.  ?   Left Ear: Tympanic membrane, ear canal and external ear normal.  ?   Nose: Congestion present.  ?   Mouth/Throat:  ?   Mouth: Mucous membranes are moist.  ?   Pharynx: Oropharynx is clear. No oropharyngeal exudate or posterior oropharyngeal erythema.  ?Cardiovascular:  ?  Rate and Rhythm: Normal rate and regular rhythm.  ?   Pulses: Normal pulses.  ?   Heart sounds: Normal heart sounds. No murmur heard. ?  No friction rub. No gallop.  ?Abdominal:  ?   General: There is no distension.  ?   Palpations: Abdomen is soft. There is no mass.  ?   Tenderness: There is no abdominal tenderness.  ?Musculoskeletal:  ?   Cervical back: Neck supple. No tenderness.  ?Lymphadenopathy:  ?   Cervical: No cervical adenopathy.  ?Psychiatric:     ?   Mood and Affect: Mood normal.     ?   Behavior: Behavior normal.     ?   Thought Content: Thought content normal.  ?   Comments: Patient with low mood, but not overtly depressed. Seemed ill  but not sad.  ? ? ? ?ASSESSMENT/PLAN:  ?Depression with anxiety ?Patientscreend 15 for PHQ-9 today. Says she has personal stressors going on, but they are improving and her mood is improving. She sees a therapist and psychiatrist, and is one meds (lamitctal, effexor). Patient planning to follow up with therapist as she hasn't seen them since [place], since things have been good. ?-Follow up with therapist ?-Continue home meds ? ?Upper respiratory infection with cough and congestion ?Patient with 3-4 days of cough, congestion, and sore throat. Patient denies any other sick symptoms and has normal activity level/diet. Patient likely suffering from URI with virus, causing cold symptoms. Less concerned for Strep Throat given age, lack of fevers, and physical exam. Also considered allergies, given patient has history of taking Claritin, but has only been taking as needed. Patient could also be suffering from sinus infection, but less likely given symptoms and lack of exam findings. Recommend symptom management, restarting allergy meds, and 2 week follow up for worsening/continued symptoms ?-Symptom Management ?-Return precautions reviewed ?-Restart Claritin ?-Follow up in 2 wk if worse/not better ? ?T-Dap Vaccine given ?  ?No orders of the defined types were placed in this encounter. ? ?No orders of the defined types were placed in this encounter. ? ?No follow-ups on file. ?@[place] @ ? ?

## 2021-05-30 DIAGNOSIS — Z566 Other physical and mental strain related to work: Secondary | ICD-10-CM | POA: Diagnosis not present

## 2021-05-30 DIAGNOSIS — F4323 Adjustment disorder with mixed anxiety and depressed mood: Secondary | ICD-10-CM | POA: Diagnosis not present

## 2021-06-18 DIAGNOSIS — F331 Major depressive disorder, recurrent, moderate: Secondary | ICD-10-CM | POA: Diagnosis not present

## 2021-06-18 DIAGNOSIS — F900 Attention-deficit hyperactivity disorder, predominantly inattentive type: Secondary | ICD-10-CM | POA: Diagnosis not present

## 2021-07-09 ENCOUNTER — Ambulatory Visit: Payer: BC Managed Care – PPO | Admitting: Family Medicine

## 2021-07-09 DIAGNOSIS — R600 Localized edema: Secondary | ICD-10-CM

## 2021-07-09 NOTE — Assessment & Plan Note (Signed)
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  Per chart review, she has had several DVT ultrasounds that were negative.  Right leg swelling is worse than left. She was prescribed 30 mmHg compression hose. Has been seen by vascular surgery and was diagnosed with venous insufficieny.   ? ?Advised to continue elevating feet at rest. Continue wearing compression hose.  Work accommodation note provided.   ?

## 2021-07-09 NOTE — Patient Instructions (Signed)
Keep your legs elevated when at rest. Continue wearing your compression hose.  ?

## 2021-07-09 NOTE — Progress Notes (Signed)
? ?  SUBJECTIVE:  ? ?CHIEF COMPLAINT / HPI:  ? ? ?Jacqueline Woodard is a 36 y.o. female here as she needs a note for her job as she is unable to wear dress shoes during the warmer months due to the swelling in her legs and feet. She wears her compression hose but at times is not enough for her to wear her required work shoes. She is often on her feet at work and would like to wear more comfortable shoes when her feet are more swollen.  ? ?Denies chest pain, shortness of breath.  ? ? ? ?PERTINENT  PMH / PSH: reviewed and updated as appropriate  ? ?OBJECTIVE:  ? ?Ht 5' (1.524 m)   Wt 190 lb 8 oz (86.4 kg)   LMP 06/12/2021   BMI 37.20 kg/m?   ? ?GEN: well appearing female in no acute distress  ?CVS: well perfused  ?RESP: speaking in full sentences without pause, no respiratory distress  ?MSK: RLE 2+ / LLE 1+ edema, no calf tenderness, wearing sneakers today  ? ?ASSESSMENT/PLAN:  ? 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  Per chart review, she has had several DVT ultrasounds that were negative.  Right leg swelling is worse than left. She was prescribed 30 mmHg compression hose. Has been seen by vascular surgery and was diagnosed with venous insufficieny.   ? ?Advised to continue elevating feet at rest. Continue wearing compression hose.  Work accommodation note provided.   ?  ?  ? ?Lyndee Hensen, DO   ?PGY-3, [facility] ?07/09/2021  ? ? ? ? ? ? ? ? ?

## 2021-07-16 DIAGNOSIS — F900 Attention-deficit hyperactivity disorder, predominantly inattentive type: Secondary | ICD-10-CM | POA: Diagnosis not present

## 2021-07-16 DIAGNOSIS — F331 Major depressive disorder, recurrent, moderate: Secondary | ICD-10-CM | POA: Diagnosis not present

## 2021-07-31 ENCOUNTER — Ambulatory Visit: Payer: BC Managed Care – PPO | Admitting: Family Medicine

## 2021-07-31 ENCOUNTER — Encounter: Payer: Self-pay | Admitting: Family Medicine

## 2021-07-31 VITALS — BP 120/98 | HR 91 | Ht 60.0 in | Wt 191.0 lb

## 2021-07-31 DIAGNOSIS — M25561 Pain in right knee: Secondary | ICD-10-CM

## 2021-07-31 DIAGNOSIS — M722 Plantar fascial fibromatosis: Secondary | ICD-10-CM

## 2021-07-31 NOTE — Assessment & Plan Note (Addendum)
Suspect patellofemoral pain syndrome given anterior medial location with popping sensation and worse with activity/stairs. Patient at risk due to female sex and obesity/weak quadricepts muscles. Strengthening exercises provided. Referral to PT and Ibuprofen as above. Recommend patient stop using knee brace. ?

## 2021-07-31 NOTE — Progress Notes (Signed)
? ? ?  SUBJECTIVE:  ? ?CHIEF COMPLAINT / HPI:  ? ?L Heel Pain ?Worsening over the past 1 month ?Told she had plantar fasciitis at one point in the past ?Changed shoes to sneakers, which helped for a while ?However recently no matter what shoes she wears, her L heel hurts ?Now hurting sometimes even when she's not on her feet ?Worse first thing in the morning and worse after a long day ?On her feet for work most of the day. Tries to elevate feet when she can ?Taking Tylenol TID without improvement ? ?R Knee Pain ?Was told she has arthritis in her knee at one point in the past ?Pain located anterior medial knee ?Worsening over the past 2-3 months ?No known injury ?Worse with walking, going down stairs ?Feels popping sensation whenever she moves it ?Wearing knee brace which she finds helpful ? ?PERTINENT  PMH / PSH: obesity, migraines, depression ? ?OBJECTIVE:  ? ?BP (!) 120/98   Pulse 91   Ht 5' (1.524 m)   Wt 191 lb (86.6 kg)   LMP 07/10/2021 (Exact Date)   BMI 37.30 kg/m?   ?General: NAD, pleasant, able to participate in exam ?Respiratory: No respiratory distress ?Skin: warm and dry, no rashes noted ?Psych: Normal affect and mood ?Neuro: grossly intact ?MSK: ?L heel-- tenderness to palpation of L calcaneus. Full ROM of foot. 1+ nonpitting edema bilateral lower extremities which is chronic. ?R Knee-- no swelling or deformity. Tenderness to palpation of anterior medial knee at the joint line. Full ROM with flexion and extension. Negative anterior/posterior drawer. No pain with valgus/varus stress. ? ? ?ASSESSMENT/PLAN:  ? ?Plantar fasciitis ?Presentation consistent with plantar fasciitis of L foot. ?-Given stretching exercises in AVS ?-Referral to PT ?-Recommended night splint (Strassburg Sock) ?-Short course of Ibuprofen '600mg'$  TID ?-Encouraged ongoing use of supportive footwear ? ?Right knee pain ?Suspect patellofemoral pain syndrome given anterior medial location with popping sensation and worse with  activity/stairs. Patient at risk due to female sex and obesity/weak quadricepts muscles. Strengthening exercises provided. Referral to PT and Ibuprofen as above. Recommend patient stop using knee brace. ? ?Diastolic blood pressure elevated today. Patient left prior to recheck. Blood pressure has always been within normal range at prior visits. Monitor at next appointment. ? ? ?Alcus Dad, MD ?[place]  ?

## 2021-07-31 NOTE — Patient Instructions (Addendum)
It was great to see you! ? ?Things we discussed at today's visit: ?- Your heel pain is likely from plantar fasciitis.  ?-I have placed a referral to physical therapy. There are also some basic stretching exercises below. ?-You can take Ibuprofen to help with inflammation. Take '600mg'$  three times daily for the next 1-2 weeks. This is not meant to be a long-term medication, but it's safe as a temporary measure. ?-Some people find a night splint to be very helpful. You can buy these over-the-counter. I recommend the "strassburg sock" ? ?-Your knee pain is likely patellofemoral pain syndrome. This happens as a result of weakness in your quadricept muscles. I have provided a handout with strengthening exercises.  ?-The physical therapy and ibuprofen as above will also help with this issue ? ?Take care and seek immediate care sooner if you develop any concerns. ? ?Dr. Rock Nephew ?[facility]  ? ? ?Plantar Fasciitis ? ?Plantar fasciitis is a painful foot condition that affects the heel. It occurs when the band of tissue that connects the toes to the heel bone (plantar fascia) becomes irritated. This can happen as the result of exercising too much or doing other repetitive activities (overuse injury). ?Plantar fasciitis can cause mild irritation to severe pain that makes it difficult to walk or move. The pain is usually worse in the morning after sleeping, or after sitting or lying down for a period of time. Pain may also be worse after long periods of walking or standing. ?What are the causes? ?This condition may be caused by: ?Standing for long periods of time. ?Wearing shoes that do not have good arch support. ?Doing activities that put stress on joints (high-impact activities). This includes ballet and exercise that makes your heart beat faster (aerobic exercise), such as running. ?Being overweight. ?An abnormal way of walking (gait). ?Tight muscles in the back of your lower leg (calf). ?High arches in your feet or  flat feet. ?Starting a new athletic activity. ?What are the signs or symptoms? ?The main symptom of this condition is heel pain. Pain may get worse after the following: ?Taking the first steps after a time of rest, especially in the morning after awakening, or after you have been sitting or lying down for a while. ?Long periods of standing still. ?Pain may decrease after 30-45 minutes of activity, such as gentle walking. ?How is this diagnosed? ?This condition may be diagnosed based on your medical history, a physical exam, and your symptoms. Your health care provider will check for: ?A tender area on the bottom of your foot. ?A high arch in your foot or flat feet. ?Pain when you move your foot. ?Difficulty moving your foot. ?You may have imaging tests to confirm the diagnosis, such as: ?X-rays. ?Ultrasound. ?MRI. ?How is this treated? ?Treatment for plantar fasciitis depends on how severe your condition is. Treatment may include: ?Rest, ice, pressure (compression), and raising (elevating) the affected foot. This is called RICE therapy. Your health care provider may recommend RICE therapy along with over-the-counter pain medicines to manage your pain. ?Exercises to stretch your calves and your plantar fascia. ?A splint that holds your foot in a stretched, upward position while you sleep (night splint). ?Physical therapy to relieve symptoms and prevent problems in the future. ?Injections of steroid medicine (cortisone) to relieve pain and inflammation. ?Stimulating your plantar fascia with electrical impulses (extracorporeal shock wave therapy). This is usually the last treatment option before surgery. ?Surgery, if other treatments have not worked after 12 months. ?  Follow these instructions at home: ?Managing pain, stiffness, and swelling ? ?If directed, put ice on the painful area. To do this: ?Put ice in a plastic bag, or use a frozen bottle of water. ?Place a towel between your skin and the bag or bottle. ?Roll  the bottom of your foot over the bag or bottle. ?Do this for 20 minutes, 2-3 times a day. ?Wear athletic shoes that have air-sole or gel-sole cushions, or try soft shoe inserts that are designed for plantar fasciitis. ?Elevate your foot above the level of your heart while you are sitting or lying down. ?Activity ?Avoid activities that cause pain. Ask your health care provider what activities are safe for you. ?Do physical therapy exercises and stretches as told by your health care provider. ?Try activities and forms of exercise that are easier on your joints (low impact). Examples include swimming, water aerobics, and biking. ?General instructions ?Take over-the-counter and prescription medicines only as told by your health care provider. ?Wear a night splint while sleeping, if told by your health care provider. Loosen the splint if your toes tingle, become numb, or turn cold and blue. ?Maintain a healthy weight, or work with your health care provider to lose weight as needed. ?Keep all follow-up visits. This is important. ?Contact a health care provider if you have: ?Symptoms that do not go away with home treatment. ?Pain that gets worse. ?Pain that affects your ability to move or do daily activities. ?Summary ?Plantar fasciitis is a painful foot condition that affects the heel. It occurs when the band of tissue that connects the toes to the heel bone (plantar fascia) becomes irritated. ?Heel pain is the main symptom of this condition. It may get worse after exercising too much or standing still for a long time. ?Treatment varies, but it usually starts with rest, ice, pressure (compression), and raising (elevating) the affected foot. This is called RICE therapy. Over-the-counter medicines can also be used to manage pain. ?This information is not intended to replace advice given to you by your health care provider. Make sure you discuss any questions you have with your health care provider. ?Document Revised:  06/21/2019 Document Reviewed: 06/21/2019 ?Elsevier Patient Education ? [place]. ? ? ?Plantar Fasciitis Rehab ?Ask your health care provider which exercises are safe for you. Do exercises exactly as told by your health care provider and adjust them as directed. It is normal to feel mild stretching, pulling, tightness, or discomfort as you do these exercises. Stop right away if you feel sudden pain or your pain gets worse. Do not begin these exercises until told by your health care provider. ?Stretching and range-of-motion exercises ?These exercises warm up your muscles and joints and improve the movement and flexibility of your foot. These exercises also help to relieve pain. ?Plantar fascia stretch ? ?Sit with your left / right leg crossed over your opposite knee. ?Hold your heel with one hand with that thumb near your arch. With your other hand, hold your toes and gently pull them back toward the top of your foot. You should feel a stretch on the base (bottom) of your toes, or the bottom of your foot (plantar fascia), or both. ?Hold this stretch for__________ seconds. ?Slowly release your toes and return to the starting position. ?Repeat __________ times. Complete this exercise __________ times a day. ?Gastrocnemius stretch, standing ?This exercise is also called a calf (gastroc) stretch. It stretches the muscles in the back of the upper calf. ?Stand with your hands  against a wall. ?Extend your left / right leg behind you, and bend your front knee slightly. ?Keeping your heels on the floor, your toes facing forward, and your back knee straight, shift your weight toward the wall. Do not arch your back. You should feel a gentle stretch in your upper calf. ?Hold this position for __________ seconds. ?Repeat __________ times. Complete this exercise __________ times a day. ?Soleus stretch, standing ?This exercise is also called a calf (soleus) stretch. It stretches the muscles in the back of the lower  calf. ?Stand with your hands against a wall. ?Extend your left / right leg behind you, and bend your front knee slightly. ?Keeping your heels on the floor and your toes facing forward, bend your back knee and shift yo

## 2021-07-31 NOTE — Assessment & Plan Note (Signed)
Presentation consistent with plantar fasciitis of L foot. ?-Given stretching exercises in AVS ?-Referral to PT ?-Recommended night splint (Strassburg Sock) ?-Short course of Ibuprofen '600mg'$  TID ?-Encouraged ongoing use of supportive footwear ?

## 2021-08-21 ENCOUNTER — Encounter: Payer: Self-pay | Admitting: *Deleted

## 2021-09-21 ENCOUNTER — Other Ambulatory Visit: Payer: Self-pay | Admitting: Family Medicine

## 2021-09-21 DIAGNOSIS — F418 Other specified anxiety disorders: Secondary | ICD-10-CM

## 2021-10-25 ENCOUNTER — Other Ambulatory Visit: Payer: Self-pay

## 2021-10-25 DIAGNOSIS — G43709 Chronic migraine without aura, not intractable, without status migrainosus: Secondary | ICD-10-CM

## 2021-10-25 DIAGNOSIS — F900 Attention-deficit hyperactivity disorder, predominantly inattentive type: Secondary | ICD-10-CM | POA: Diagnosis not present

## 2021-10-25 DIAGNOSIS — F331 Major depressive disorder, recurrent, moderate: Secondary | ICD-10-CM | POA: Diagnosis not present

## 2021-10-25 MED ORDER — ONDANSETRON HCL 4 MG PO TABS: 4.0000 mg | ORAL_TABLET | Freq: Three times a day (TID) | ORAL | 0 refills | Status: DC | PRN

## 2021-11-13 ENCOUNTER — Encounter (HOSPITAL_COMMUNITY): Payer: Self-pay | Admitting: Emergency Medicine

## 2021-11-13 ENCOUNTER — Emergency Department (HOSPITAL_COMMUNITY)
Admission: EM | Admit: 2021-11-13 | Discharge: 2021-11-14 | Disposition: A | Discharge status: Home / Self Care | Payer: BC Managed Care – PPO | Attending: Emergency Medicine | Admitting: Emergency Medicine

## 2021-11-13 ENCOUNTER — Other Ambulatory Visit: Payer: Self-pay

## 2021-11-13 DIAGNOSIS — R110 Nausea: Secondary | ICD-10-CM

## 2021-11-13 DIAGNOSIS — Z8542 Personal history of malignant neoplasm of other parts of uterus: Secondary | ICD-10-CM | POA: Insufficient documentation

## 2021-11-13 DIAGNOSIS — J45909 Unspecified asthma, uncomplicated: Secondary | ICD-10-CM | POA: Diagnosis not present

## 2021-11-13 LAB — CBC WITH DIFFERENTIAL/PLATELET
Abs Immature Granulocytes: 0.02 10*3/uL (ref 0.00–0.07)
Basophils Absolute: 0.1 10*3/uL (ref 0.0–0.1)
Basophils Relative: 1 %
Eosinophils Absolute: 0.1 10*3/uL (ref 0.0–0.5)
Eosinophils Relative: 1 %
HCT: 40.9 % (ref 36.0–46.0)
Hemoglobin: 13.2 g/dL (ref 12.0–15.0)
Immature Granulocytes: 0 %
Lymphocytes Relative: 43 %
Lymphs Abs: 3.9 10*3/uL (ref 0.7–4.0)
MCH: 26.8 pg (ref 26.0–34.0)
MCHC: 32.3 g/dL (ref 30.0–36.0)
MCV: 83.1 fL (ref 80.0–100.0)
Monocytes Absolute: 0.7 10*3/uL (ref 0.1–1.0)
Monocytes Relative: 7 %
Neutro Abs: 4.4 10*3/uL (ref 1.7–7.7)
Neutrophils Relative %: 48 %
Platelets: 315 10*3/uL (ref 150–400)
RBC: 4.92 MIL/uL (ref 3.87–5.11)
RDW: 13.2 % (ref 11.5–15.5)
WBC: 9.2 10*3/uL (ref 4.0–10.5)
nRBC: 0 % (ref 0.0–0.2)

## 2021-11-13 LAB — I-STAT BETA HCG BLOOD, ED (MC, WL, AP ONLY): I-stat hCG, quantitative: <5 m[IU]/mL (ref <5)

## 2021-11-13 NOTE — ED Triage Notes (Signed)
Pt reports nothing to eat or drink in 5 days due to nausea.  Last bowel movement 5 days ago.

## 2021-11-13 NOTE — ED Provider Triage Note (Signed)
Emergency Medicine Provider Triage Evaluation Note  Jacqueline Woodard , a 36 y.o. female  was evaluated in triage.  Pt complains of not feeling well x 4 days. Reports generalized weakness, nausea, decreased appetite/PO intake relaying that she has not really had anything to eat or drink with her nausea. Last BM was 5 days prior, a bit loose but otherwise normal. Having some hot flashes but no fevers. Denies vomiting, URI sxs, chest pain, or dyspnea.   Review of Systems  Per above.   Physical Exam  BP (!) 124/91 (BP Location: Right Arm)   Pulse 87   Temp 98.2 F (36.8 C) (Oral)   Resp 15   SpO2 99%  Gen:   Awake, no distress   Resp:  Normal effort  MSK:   Moves extremities without difficulty  Other:  No peritoneal signs on abdominal exam.   Medical Decision Making  Medically screening exam initiated at 11:17 PM.  Appropriate orders placed.  Jacqueline Woodard was informed that the remainder of the evaluation will be completed by another provider, this initial triage assessment does not replace that evaluation, and the importance of remaining in the ED until their evaluation is complete.  Nausea.    Jacqueline Woodard, [place] 11/13/21 2319

## 2021-11-14 LAB — COMPREHENSIVE METABOLIC PANEL
ALT: 9 U/L (ref 0–44)
AST: 18 U/L (ref 15–41)
Albumin: 3.5 g/dL (ref 3.5–5.0)
Alkaline Phosphatase: 70 U/L (ref 38–126)
Anion gap: 9 (ref 5–15)
BUN: 9 mg/dL (ref 6–20)
CO2: 22 mmol/L (ref 22–32)
Calcium: 9 mg/dL (ref 8.9–10.3)
Chloride: 108 mmol/L (ref 98–111)
Creatinine, Ser: 0.95 mg/dL (ref 0.44–1.00)
GFR, Estimated: >60 mL/min (ref >60)
Glucose, Bld: 108 mg/dL — ABNORMAL HIGH (ref 70–99)
Potassium: 3.1 mmol/L — ABNORMAL LOW (ref 3.5–5.1)
Sodium: 139 mmol/L (ref 135–145)
Total Bilirubin: 0.5 mg/dL (ref 0.3–1.2)
Total Protein: 6.8 g/dL (ref 6.5–8.1)

## 2021-11-14 LAB — URINALYSIS, ROUTINE W REFLEX MICROSCOPIC
Bilirubin Urine: NEGATIVE
Glucose, UA: NEGATIVE mg/dL
Hgb urine dipstick: NEGATIVE
Ketones, ur: 5 mg/dL — AB
Nitrite: NEGATIVE
Protein, ur: NEGATIVE mg/dL
Specific Gravity, Urine: 1.023 (ref 1.005–1.030)
pH: 6 (ref 5.0–8.0)

## 2021-11-14 LAB — LIPASE, BLOOD: Lipase: 25 U/L (ref 11–51)

## 2021-11-14 MED ORDER — KETOROLAC TROMETHAMINE 15 MG/ML IJ SOLN
15.0000 mg | Freq: Once | INTRAMUSCULAR | Status: AC
  Administered 2021-11-14: 15 mg via INTRAVENOUS
  Filled 2021-11-14: qty 1

## 2021-11-14 MED ORDER — SODIUM CHLORIDE 0.9 % IV BOLUS
1000.0000 mL | Freq: Once | INTRAVENOUS | Status: AC
  Administered 2021-11-14: 1000 mL via INTRAVENOUS

## 2021-11-14 MED ORDER — POLYETHYLENE GLYCOL 3350 17 G PO PACK: 17.0000 g | PACK | Freq: Every day | ORAL | 1 refills | Status: DC

## 2021-11-14 MED ORDER — PROCHLORPERAZINE EDISYLATE 10 MG/2ML IJ SOLN
10.0000 mg | Freq: Once | INTRAMUSCULAR | Status: AC
  Administered 2021-11-14: 10 mg via INTRAVENOUS
  Filled 2021-11-14: qty 2

## 2021-11-14 MED ORDER — PROCHLORPERAZINE MALEATE 10 MG PO TABS: 10.0000 mg | ORAL_TABLET | Freq: Two times a day (BID) | ORAL | 0 refills | Status: DC | PRN

## 2021-11-14 MED ORDER — DIPHENHYDRAMINE HCL 50 MG/ML IJ SOLN
25.0000 mg | Freq: Once | INTRAMUSCULAR | Status: AC
  Administered 2021-11-14: 25 mg via INTRAVENOUS
  Filled 2021-11-14: qty 1

## 2021-11-14 NOTE — Discharge Instructions (Signed)
You were evaluated in the Emergency Department and after careful evaluation, we did not find any emergent condition requiring admission or further testing in the hospital.  Your exam/testing today is overall reassuring.  Can use the Compazine medication as needed for nausea or headache.  Use the MiraLAX up to 6 times daily for constipation.  Please return to the Emergency Department if you experience any worsening of your condition.   Thank you for allowing [place] to be a part of your care.

## 2021-11-14 NOTE — ED Provider Notes (Signed)
[facility] Emergency Department Provider Note MRN:  [id number]  Arrival date & time: 11/14/21     Chief Complaint   Nausea   History of Present Illness   Jacqueline LOOMAN is a 36 y.o. year-old female with a history of migraines presenting to the ED with chief complaint of nausea.  Nausea for the past 5 days, migraine during this time as well.  Long history of migraines.  No numbness or weakness to the arms or legs, no fever, no abdominal pain.  Review of Systems  A thorough review of systems was obtained and all systems are negative except as noted in the HPI and PMH.   Patient's Health History    Past Medical History:  Diagnosis Date   Chronic migraine    Endometrial polyp    GERD (gastroesophageal reflux disease)    Hereditary lymphedema of legs    chronic --- right > left   History of acute renal failure    in setting cellulitis left lower leg 06/ 2008 and right lower leg cellulitis with sepsis   History of borderline diabetes mellitus    08-29-2017 PER PT DX 2016 was checked again last year no longer pre-diabetic   History of cellulitis    06/ 2008  left lower leg cellulitis:  12-05-2013  and 06-04-2014-- right lower leg cellulitis w/ sepsis   History of Clostridium difficile infection 08/2006   History of gastric ulcer 2015   History of ovarian cyst    History of sepsis    w/ right lower leg cellulitis 12-05-2013 and 06-04-2014   Microcytic anemia    Mild asthma    INHALER PRN-- LAST USED 2014   Uterine fibroid    Wears glasses     Past Surgical History:  Procedure Laterality Date   BREAST REDUCTION SURGERY Bilateral 03/01/2013   Procedure: BILATERAL MAMMARY REDUCTION  (BREAST);  Surgeon: Cristine Polio, MD;  Location: [facility];  Service: Plastics;  Laterality: Bilateral;   BREAST SURGERY Bilateral 2015   Breast reduction   DILATATION & CURETTAGE/HYSTEROSCOPY WITH MYOSURE N/A 09/05/2017   Procedure: DILATATION &  CURETTAGE/HYSTEROSCOPY WITH MYOSURE;  Surgeon: Anastasio Auerbach, MD;  Location: [facility];  Service: Gynecology;  Laterality: N/A;  requests 7:30am OR time Requests one hour   DILATION AND CURETTAGE OF UTERUS  09/05/2017   Hyperplastic uterus, uterine polyp removal, irregular bleeding   WISDOM TOOTH EXTRACTION      Family History  Problem Relation Age of Onset   Thyroid disease Mother    Fibroids Mother    Asthma Father    Edema Father     Social History   Socioeconomic History   Marital status: Single    Spouse name: Not on file   Number of children: Not on file   Years of education: Not on file   Highest education level: Not on file  Occupational History   Not on file  Tobacco Use   Smoking status: Never   Smokeless tobacco: Never  Vaping Use   Vaping Use: Never used  Substance and Sexual Activity   Alcohol use: Yes    Comment: Social   Drug use: No   Sexual activity: Yes    Partners: Female    Birth control/protection: None    Comment: 1st intercourse 34 yo-5 partners  Other Topics Concern   Not on file  Social History Narrative   Not on file   Social Determinants of Health   Financial  Resource Strain: Not on file  Food Insecurity: Not on file  Transportation Needs: Not on file  Physical Activity: Not on file  Stress: Not on file  Social Connections: Not on file  Intimate Partner Violence: Not on file     Physical Exam   Vitals:   11/14/21 0600 11/14/21 0615  BP: 130/86 132/79  Pulse: 77 77  Resp: 19 19  Temp:    SpO2: 99% 99%    CONSTITUTIONAL: Well-appearing, NAD NEURO/PSYCH:  Alert and oriented x 3, normal and symmetric strength and sensation, normal coordination, normal speech EYES:  eyes equal and reactive ENT/NECK:  no LAD, no JVD CARDIO: Regular rate, well-perfused, normal S1 and S2 PULM:  CTAB no wheezing or rhonchi GI/GU:  non-distended, non-tender MSK/SPINE:  No gross deformities, no edema SKIN:  no rash,  atraumatic   *Additional and/or pertinent findings included in MDM below  Diagnostic and Interventional Summary    EKG Interpretation  Date/Time:    Ventricular Rate:    PR Interval:    QRS Duration:   QT Interval:    QTC Calculation:   R Axis:     Text Interpretation:         Labs Reviewed  COMPREHENSIVE METABOLIC PANEL - Abnormal; Notable for the following components:      Result Value   Potassium 3.1 (*)    Glucose, Bld 108 (*)    All other components within normal limits  URINALYSIS, ROUTINE W REFLEX MICROSCOPIC - Abnormal; Notable for the following components:   Color, Urine AMBER (*)    APPearance CLOUDY (*)    Ketones, ur 5 (*)    Leukocytes,Ua TRACE (*)    Bacteria, UA FEW (*)    All other components within normal limits  CBC WITH DIFFERENTIAL/PLATELET  LIPASE, BLOOD  I-STAT BETA HCG BLOOD, ED ([facility], [facility], [facility] ONLY)    No orders to display    Medications  ketorolac (TORADOL) 15 MG/ML injection 15 mg (15 mg Intravenous Given 11/14/21 0505)  diphenhydrAMINE (BENADRYL) injection 25 mg (25 mg Intravenous Given 11/14/21 0500)  prochlorperazine (COMPAZINE) injection 10 mg (10 mg Intravenous Given 11/14/21 0505)  sodium chloride 0.9 % bolus 1,000 mL (1,000 mLs Intravenous New Bag/Given 11/14/21 0459)     Procedures  /  Critical Care Procedures  ED Course and Medical Decision Making  Initial Impression and Ddx DDx includes viral illness, migraine.  Given reassuring exam doubt intracranial emergencies such as bleeding or mass.  Providing migraine cocktail and will reassess.  Past medical/surgical history that increases complexity of ED encounter: None  Interpretation of Diagnostics I personally reviewed the laboratory assessment and my interpretation is as follows: No significant blood count or electrolyte disturbance    Patient Reassessment and Ultimate Disposition/Management     Patient feeling a lot better after migraine cocktail, continued reassuring  abdominal exam, neurological exam.  No indication for more advanced imaging, appropriate for discharge.  Patient management required discussion with the following services or consulting groups:  None  Complexity of Problems Addressed Acute illness or injury that poses threat of life of bodily function  Additional Data Reviewed and Analyzed Further history obtained from: Prior labs/imaging results  Additional Factors Impacting ED Encounter Risk Prescriptions  Barth Kirks. Sedonia Small, MD [place] [email or web address]  Final Clinical Impressions(s) / ED Diagnoses     ICD-10-CM   1. Nausea  R11.0       ED Discharge Orders  Ordered    polyethylene glycol (MIRALAX) 17 g packet  Daily        11/14/21 0623    prochlorperazine (COMPAZINE) 10 MG tablet  2 times daily PRN        11/14/21 5784             Discharge Instructions Discussed with and Provided to Patient:     Discharge Instructions      You were evaluated in the Emergency Department and after careful evaluation, we did not find any emergent condition requiring admission or further testing in the hospital.  Your exam/testing today is overall reassuring.  Can use the Compazine medication as needed for nausea or headache.  Use the MiraLAX up to 6 times daily for constipation.  Please return to the Emergency Department if you experience any worsening of your condition.   Thank you for allowing [place] to be a part of your care.       Maudie Flakes, MD 11/14/21 [phone]

## 2021-11-15 ENCOUNTER — Telehealth: Payer: Self-pay

## 2021-11-15 NOTE — Patient Outreach (Signed)
  Care Coordination [facility] Note Transition Care Management Unsuccessful Follow-up Telephone Call  Date of discharge and from where[facility]  Attempts:  1st Attempt  Reason for unsuccessful TCM follow-up call:  No answer/busy  Johnney Killian, RN, BSN, CCM Care Management Coordinator [facility] Phone: [phone]: [phone]

## 2021-11-22 DIAGNOSIS — F331 Major depressive disorder, recurrent, moderate: Secondary | ICD-10-CM | POA: Diagnosis not present

## 2021-11-22 DIAGNOSIS — F900 Attention-deficit hyperactivity disorder, predominantly inattentive type: Secondary | ICD-10-CM | POA: Diagnosis not present

## 2021-12-17 ENCOUNTER — Ambulatory Visit (INDEPENDENT_AMBULATORY_CARE_PROVIDER_SITE_OTHER): Payer: BC Managed Care – PPO | Admitting: Family Medicine

## 2021-12-17 VITALS — BP 127/82 | HR 73 | Ht 60.0 in | Wt 191.6 lb

## 2021-12-17 DIAGNOSIS — M79675 Pain in left toe(s): Secondary | ICD-10-CM | POA: Insufficient documentation

## 2021-12-17 NOTE — Patient Instructions (Signed)
It was great seeing you today!  Today we discussed your toe pain, given the fact that it is ongoing we will get imaging of both that toe and your entire left foot. Please continue to use ice and alternating between ice and heat may help. Continue to elevate your legs.   Please follow up at your next scheduled appointment, if anything arises between now and then, please don't hesitate to contact our office.   Thank you for allowing [place] to be a part of your medical care!  Thank you, Dr. Larae Grooms

## 2021-12-17 NOTE — Assessment & Plan Note (Signed)
-  unsure of etiology, suspicious for fracture given point tenderness although would expect more pain and issues with walking. Low concern for gout. No signs of infection, low suspicion for cellulitis. Does not seem consistent with Morton's neuroma as well.  -will obtain 2nd left toe and complete left foot x-rays to assess for other gross abnormality as there is nothing obvious on clinical exam -continue conservative measures including elevating feet, alternating between ice/heat and wearing comfortable shoes  -follow up based upon imaging results

## 2021-12-17 NOTE — Progress Notes (Signed)
    SUBJECTIVE:   CHIEF COMPLAINT / HPI:   Patient presents with 2nd toe pain on her left foot that has been ongoing for the past 2 months. Aggravating factors include walking or baring weight. Relieving factors include rest. Denies any trauma that she can recall. Denies fever, chills, trauma, numbness, tingling or skin changes. She has tried multiple things including buddy taping, elevated at night, rotates between tylenol and ibuprofen and wears comfortable sneakers as much as possible. She does not wear heels often, maybe once or twice a month.   OBJECTIVE:   BP 127/82   Pulse 73   Ht 5' (1.524 m)   Wt 191 lb 9.6 oz (86.9 kg)   LMP 11/23/2021   SpO2 98%   BMI 37.42 kg/m   General: Patient well-appearing, in no acute distress. Resp: normal work of breathing MSK: distal pulses strong and equal bilaterally, no skin changes noted, full ROM of all toes bilaterally, tenderness noted along 2nd toe of left foot with deep palpation and active range of motion, no edema or erythema noted    ASSESSMENT/PLAN:   Toe pain, left -unsure of etiology, suspicious for fracture given point tenderness although would expect more pain and issues with walking. Low concern for gout. No signs of infection, low suspicion for cellulitis. Does not seem consistent with Morton's neuroma as well.  -will obtain 2nd left toe and complete left foot x-rays to assess for other gross abnormality as there is nothing obvious on clinical exam -continue conservative measures including elevating feet, alternating between ice/heat and wearing comfortable shoes  -follow up based upon imaging results      Irena Gaydos Larae Grooms, [place]

## 2021-12-20 DIAGNOSIS — F900 Attention-deficit hyperactivity disorder, predominantly inattentive type: Secondary | ICD-10-CM | POA: Diagnosis not present

## 2021-12-20 DIAGNOSIS — F331 Major depressive disorder, recurrent, moderate: Secondary | ICD-10-CM | POA: Diagnosis not present

## 2021-12-28 ENCOUNTER — Ambulatory Visit
Admission: RE | Admit: 2021-12-28 | Discharge: 2021-12-28 | Disposition: A | Discharge status: Home / Self Care | Payer: BC Managed Care – PPO | Source: Ambulatory Visit | Attending: Family Medicine | Admitting: Family Medicine

## 2021-12-28 DIAGNOSIS — M79675 Pain in left toe(s): Secondary | ICD-10-CM

## 2021-12-28 DIAGNOSIS — M7989 Other specified soft tissue disorders: Secondary | ICD-10-CM | POA: Diagnosis not present

## 2022-01-15 ENCOUNTER — Encounter: Payer: Self-pay | Admitting: Family Medicine

## 2022-01-15 ENCOUNTER — Ambulatory Visit: Payer: BC Managed Care – PPO | Admitting: Family Medicine

## 2022-01-15 VITALS — BP 105/70 | HR 91 | Ht 61.0 in | Wt 194.0 lb

## 2022-01-15 DIAGNOSIS — R062 Wheezing: Secondary | ICD-10-CM

## 2022-01-15 DIAGNOSIS — R350 Frequency of micturition: Secondary | ICD-10-CM | POA: Diagnosis not present

## 2022-01-15 DIAGNOSIS — R300 Dysuria: Secondary | ICD-10-CM | POA: Diagnosis not present

## 2022-01-15 DIAGNOSIS — J4520 Mild intermittent asthma, uncomplicated: Secondary | ICD-10-CM | POA: Diagnosis not present

## 2022-01-15 LAB — POCT URINALYSIS DIP (MANUAL ENTRY)
Bilirubin, UA: NEGATIVE
Blood, UA: NEGATIVE
Glucose, UA: NEGATIVE mg/dL
Ketones, POC UA: NEGATIVE mg/dL
Nitrite, UA: POSITIVE — AB
Protein Ur, POC: NEGATIVE mg/dL
Spec Grav, UA: >=1.03 — AB (ref 1.010–1.025)
Urobilinogen, UA: 0.2 E.U./dL
pH, UA: 5.5 (ref 5.0–8.0)

## 2022-01-15 LAB — POCT UA - MICROSCOPIC ONLY
Epithelial cells, urine per micros: >20
RBC, Urine, Miroscopic: NONE SEEN (ref 0–2)

## 2022-01-15 MED ORDER — CEPHALEXIN 500 MG PO CAPS: 500.0000 mg | ORAL_CAPSULE | Freq: Two times a day (BID) | ORAL | 0 refills | Status: DC

## 2022-01-15 MED ORDER — CEPHALEXIN 500 MG PO CAPS: 500.0000 mg | ORAL_CAPSULE | Freq: Three times a day (TID) | ORAL | 0 refills | Status: DC

## 2022-01-15 NOTE — Progress Notes (Signed)
    SUBJECTIVE:   CHIEF COMPLAINT / HPI:   Patient presets for wheezing and urinary issues.  She states she has been having a lot of wheezing and SOB for past 3 weeks and has been using rescue inhaler every few hours. Initially had mucous production for about 3 or 4 days that went away. Wheezing now so bad she coughs and chokes. States she can be laying and watching TV and starts wheezing. Has been uzing symbicort 2 puffs BID and still needs albuterol inhaler about 5 or 6 tiems a day. Tested negative for COVID at home.   Also feels she may have a UTI. Lower abdominal pain sometimes when she uses the restroom, sometimes has an odor and also cloudy. Endorses frequency, no hesitancy. Feels she cant hold her bladder. This has been occurring for the past 3-4 days days. Denies issues with bowel movements. No vaginal symptoms   PERTINENT  PMH / PSH: Reviewed   OBJECTIVE:   LMP 11/23/2021  BP 105/70, P 91, SpO2 100%  Physical exam General: well appearing, NAD Cardiovascular: RRR, no murmurs Lungs: CTAB without wheezing. Normal WOB Abdomen: soft, non-distended, non-tender Skin: warm, dry. No edema  ASSESSMENT/PLAN:   No problem-specific Assessment & Plan notes found for this encounter.   Urinary frequency  Patient endorses urinary frequency, odor and cloudy appearance intermittently for the past several days.  UA positive for nitrites and trace leuks.  Treating with Keflex 500 mg twice daily for 7 days.  Wheezing Patient endorses about 3 weeks of increased wheezing and shortness of breath at rest.  Also with a cough but denies any other symptoms. Physical exam reassuring as she had clear lung sounds without any wheezing. Did not use albuterol today. This is potentially worsening of her asthma in the setting of a cold or allergies.  She is currently using Symbicort 2 puffs twice daily and albuterol about 5 times a day during his flare.  Since PFTs have not been done before, scheduled for her  to meet with pharmacy team next week before we change medication regimen.  She has not been taking her allergy medication, and advised her to try taking her Flonase and Claritin again. Return precautions discussed   Shary Key, [place]

## 2022-01-15 NOTE — Assessment & Plan Note (Signed)
Patient endorses urinary frequency, odor and cloudy appearance intermittently for the past several days.  UA positive for nitrites and trace leuks.  Treating with Keflex 500 mg twice daily for 7 days.

## 2022-01-15 NOTE — Patient Instructions (Addendum)
It was great seeing you today!  I have booked you with our pharmacy team to test your lung function so we can determine best medication for your asthma.  During this time I encourage you to continue using your Flonase daily and your Claritin.    For your urinary tract infection I prescribed Keflex to use 2 times daily for 7 days  We will see you next week at your pharmacy appointment, but if you need to be seen earlier than that for any new issues we're happy to fit you in, just give [place] a call!  Visit Reminders: - Stop by the pharmacy to pick up your prescriptions  - Continue to work on your healthy eating habits and incorporating exercise into your daily life.   Feel free to call with any questions or concerns at any time, at [phone].   Take care,  Dr. Shary Key [facility]

## 2022-01-15 NOTE — Assessment & Plan Note (Signed)
Patient endorses about 3 weeks of increased wheezing and shortness of breath at rest.  Also with a cough but denies any other symptoms.  She is potentially worsening her asthma in the setting of a cold or allergies.  He is currently using Symbicort 2 puffs twice daily and albuterol about 5 times a day during his flare.  Since PFTs have not been done before, scheduled for her to meet with pharmacy team next week.  She has not been taking her allergy medication, and advised her to try taking her Flonase and Claritin again. Return precautions discussed

## 2022-01-17 ENCOUNTER — Telehealth: Payer: Self-pay

## 2022-01-17 DIAGNOSIS — F331 Major depressive disorder, recurrent, moderate: Secondary | ICD-10-CM | POA: Diagnosis not present

## 2022-01-17 DIAGNOSIS — F900 Attention-deficit hyperactivity disorder, predominantly inattentive type: Secondary | ICD-10-CM | POA: Diagnosis not present

## 2022-01-17 NOTE — Patient Outreach (Signed)
  Care Coordination   Initial Visit Note   01/17/2022 Name: Jacqueline Woodard MRN: [id number] DOB: 1985/06/11  Jacqueline Woodard is a 36 y.o. year old female who sees Alcus Dad, MD for primary care. I spoke with  Jacqueline Woodard by phone today.  What matters to the patients health and wellness today?  none    Goals Addressed             This Visit's Progress    COMPLETED: Care Coordination Activities-No follow up required       Care Coordination Interventions: Advised patient to schedule annual wellness visit.  Discussed [facility] services and support.  Patient declined.          SDOH assessments and interventions completed:  Yes     Care Coordination Interventions Activated:  Yes  Care Coordination Interventions:  Yes, provided   Follow up plan: No further intervention required.   Encounter Outcome:  Pt. Visit Completed   Jone Baseman, RN, MSN [place] [facility] Care Management Coordinator Direct Line [phone]

## 2022-01-17 NOTE — Patient Instructions (Signed)
Visit Information  Thank you for taking time to visit with me today. Please don't hesitate to contact me if I can be of assistance to you.   Following are the goals we discussed today:   Goals Addressed             This Visit's Progress    COMPLETED: Care Coordination Activities-No follow up required       Care Coordination Interventions: Advised patient to schedule annual wellness visit.  Discussed [facility] services and support.  Patient declined.           If you are experiencing a Mental Health or [place] or need someone to talk to, please call the Suicide and Crisis Lifeline: 988   Patient verbalizes understanding of instructions and care plan provided today and agrees to view in [place]. Active MyChart status and patient understanding of how to access instructions and care plan via MyChart confirmed with patient.     No further follow up required: decline  Jone Baseman, RN, MSN [place] Management Care Management Coordinator Direct Line [phone]

## 2022-01-22 ENCOUNTER — Ambulatory Visit: Payer: BC Managed Care – PPO | Admitting: Pharmacist

## 2022-01-22 ENCOUNTER — Encounter: Payer: Self-pay | Admitting: Pharmacist

## 2022-01-22 VITALS — BP 109/64 | HR 81 | Ht 61.0 in | Wt 192.6 lb

## 2022-01-22 DIAGNOSIS — J4520 Mild intermittent asthma, uncomplicated: Secondary | ICD-10-CM

## 2022-01-22 NOTE — Assessment & Plan Note (Addendum)
Patient has been experiencing shortness of breath, wheezing and coughing episodes for a few weeks and taking Symbicort and PRN albuterol with only moderate improvement.  Medication adherence good.  Spirometry evaluation with pre- and post-bronchodilator reveals near normal PFT with small amount of reversibility in a patient with history of asthma.  Likely exacerbation related to recent respiratory infection. - Recommended OTC Delsym (dextromethorphan) at night to help with cough - Recommended taking 1 teaspoon of honey in the morning to help with cough - Provided education on proper inhaler technique - Recommended patient reschedule with pharmacy team to repeat PFTs once the patient has recovered from her illness and is back to her baseline breathing

## 2022-01-22 NOTE — Patient Instructions (Addendum)
It was great to meet you today!  We think you may have an ongoing respiratory virus that may be causing your shortness of breath and cough.   I recommend using Delsym (dextromethorphan) over the counter to help with your cough. Take this medication at night.  Try eating a teaspoon of honey in the morning, as this may also help your cough.   When you feel your breathing is back to normal, schedule to come back with [place] to perform the breathing test again.

## 2022-01-22 NOTE — Progress Notes (Signed)
   S:    36 y.o. female who presents for lung function evaluation.  PMH is significant for migraines and obesity.  Patient was referred and last seen by Primary Care Provider, Dr. Arby Barrette, on 01/15/22.   At last visit, the patient reported increased wheezing and shortness or breath.   Patient reports breathing has been worsening over the past few weeks and she has been waking in the morning with coughing episodes lasting > 10 minutes. Patient denies atopic sx. Patient reporting breathing today is 3/10.  Patient reports she was diagnosed with asthma at age 18 and has never been a smoker.  Patient reports adherence to medications Patient reports last dose of asthma medications was yesterday Current asthma medications: Symbicort (formoterol/budesonide) 2 puffs BID, albuterol PRN Rescue inhaler use frequency: 4-5x/day  Level of asthma sx control- in the last 4 weeks: Question Scoring Patient Score  Daytime sx > 2x/week Yes (1)   No (0) 1  Any nighttime waking due to asthma Yes (1)   No (0) 1  Reliever needed >2x/week Yes (1)   No (0) 1  Any activity limitation due to asthma Yes (1)   No (0) 1   Total Score 4  Well controlled - 0, Partly controlled - 1-2, Uncontrolled 3-4  O: Review of Systems  All other systems reviewed and are negative.   Physical Exam Constitutional:      Appearance: Normal appearance.  Pulmonary:     Effort: Pulmonary effort is normal.  Neurological:     Mental Status: She is alert.  Psychiatric:        Mood and Affect: Mood normal.        Behavior: Behavior normal.        Thought Content: Thought content normal.        Judgment: Judgment normal.     Vitals:   01/22/22 0851 01/22/22 0910  BP: 112/76 109/64  Pulse:  81  SpO2:  100%    See "scanned report" or Documentation Flowsheet (discrete results - PFTs) for  Spirometry results. Patient provided good effort while attempting spirometry.    Lung Age = 58 Albuterol Neb  Lot# [id number]     Exp.  05/16/23 Reported subjective improvement in breathing post nebulized albuterol tx.   A/P: Patient has been experiencing shortness of breath, wheezing and coughing episodes for a few weeks and taking Symbicort and PRN albuterol with only moderate improvement.  Medication adherence good.  Spirometry evaluation with pre- and post-bronchodilator reveals near normal PFT with small amount of reversibility in a patient with history of intermittent asthma.  Likely exacerbation related to recent respiratory infection. - Recommended OTC Delsym (dextromethorphan) at night to help with cough - Recommended taking 1 teaspoon of honey in the morning to help with cough - Provided education on proper inhaler technique - Recommended patient reschedule with pharmacy team to repeat PFTs once the patient has recovered from her illness and is back to her baseline breathing   -Reviewed results of pulmonary function tests. Patient verbalized understanding of treatment plan.   Written patient instructions provided.  Total time in face to face counseling 36 minutes.    Follow-up:  Pharmacist once breathing has returned to baseline (~Spring 2024). PCP clinic visit in 1-2 months if breathing/cough has not improved.  Patient seen with Geraldo Docker, PharmD Candidate and Park Liter, PharmD Candidate.

## 2022-02-14 DIAGNOSIS — F331 Major depressive disorder, recurrent, moderate: Secondary | ICD-10-CM | POA: Diagnosis not present

## 2022-02-14 DIAGNOSIS — F900 Attention-deficit hyperactivity disorder, predominantly inattentive type: Secondary | ICD-10-CM | POA: Diagnosis not present

## 2022-09-06 ENCOUNTER — Emergency Department (HOSPITAL_COMMUNITY)
Admission: EM | Admit: 2022-09-06 | Discharge: 2022-09-06 | Disposition: A | Discharge status: Home / Self Care | Payer: No Typology Code available for payment source | Attending: Emergency Medicine | Admitting: Emergency Medicine

## 2022-09-06 ENCOUNTER — Emergency Department (HOSPITAL_COMMUNITY): Payer: No Typology Code available for payment source

## 2022-09-06 ENCOUNTER — Other Ambulatory Visit: Payer: Self-pay

## 2022-09-06 DIAGNOSIS — J45909 Unspecified asthma, uncomplicated: Secondary | ICD-10-CM | POA: Insufficient documentation

## 2022-09-06 DIAGNOSIS — R0789 Other chest pain: Secondary | ICD-10-CM | POA: Insufficient documentation

## 2022-09-06 DIAGNOSIS — Y92410 Unspecified street and highway as the place of occurrence of the external cause: Secondary | ICD-10-CM | POA: Diagnosis not present

## 2022-09-06 DIAGNOSIS — R000 Tachycardia, unspecified: Secondary | ICD-10-CM | POA: Diagnosis not present

## 2022-09-06 DIAGNOSIS — Z7951 Long term (current) use of inhaled steroids: Secondary | ICD-10-CM | POA: Diagnosis not present

## 2022-09-06 DIAGNOSIS — M542 Cervicalgia: Secondary | ICD-10-CM | POA: Insufficient documentation

## 2022-09-06 DIAGNOSIS — S8991XA Unspecified injury of right lower leg, initial encounter: Secondary | ICD-10-CM | POA: Diagnosis present

## 2022-09-06 DIAGNOSIS — R600 Localized edema: Secondary | ICD-10-CM | POA: Diagnosis not present

## 2022-09-06 DIAGNOSIS — S8011XA Contusion of right lower leg, initial encounter: Secondary | ICD-10-CM | POA: Insufficient documentation

## 2022-09-06 LAB — I-STAT CHEM 8, ED
BUN: 11 mg/dL (ref 6–20)
Calcium, Ion: 1.12 mmol/L — ABNORMAL LOW (ref 1.15–1.40)
Chloride: 106 mmol/L (ref 98–111)
Creatinine, Ser: 1 mg/dL (ref 0.44–1.00)
Glucose, Bld: 77 mg/dL (ref 70–99)
HCT: 41 % (ref 36.0–46.0)
Hemoglobin: 13.9 g/dL (ref 12.0–15.0)
Potassium: 3.7 mmol/L (ref 3.5–5.1)
Sodium: 138 mmol/L (ref 135–145)
TCO2: 24 mmol/L (ref 22–32)

## 2022-09-06 LAB — POC URINE PREG, ED: Preg Test, Ur: NEGATIVE

## 2022-09-06 MED ORDER — ONDANSETRON HCL 4 MG/2ML IJ SOLN
4.0000 mg | Freq: Once | INTRAMUSCULAR | Status: AC
  Administered 2022-09-06: 4 mg via INTRAVENOUS
  Filled 2022-09-06: qty 2

## 2022-09-06 MED ORDER — SODIUM CHLORIDE (PF) 0.9 % IJ SOLN
INTRAMUSCULAR | Status: AC
  Filled 2022-09-06: qty 50

## 2022-09-06 MED ORDER — IOHEXOL 350 MG/ML SOLN
100.0000 mL | Freq: Once | INTRAVENOUS | Status: AC | PRN
  Administered 2022-09-06: 100 mL via INTRAVENOUS

## 2022-09-06 MED ORDER — METHOCARBAMOL 500 MG PO TABS
750.0000 mg | ORAL_TABLET | Freq: Once | ORAL | Status: AC
  Administered 2022-09-06: 750 mg via ORAL
  Filled 2022-09-06: qty 2

## 2022-09-06 MED ORDER — OXYCODONE-ACETAMINOPHEN 5-325 MG PO TABS: 1.0000 | ORAL_TABLET | Freq: Four times a day (QID) | ORAL | 0 refills | Status: DC | PRN

## 2022-09-06 MED ORDER — OXYCODONE-ACETAMINOPHEN 5-325 MG PO TABS
1.0000 | ORAL_TABLET | Freq: Once | ORAL | Status: AC
  Administered 2022-09-06: 1 via ORAL
  Filled 2022-09-06: qty 1

## 2022-09-06 MED ORDER — METHOCARBAMOL 500 MG PO TABS: 500.0000 mg | ORAL_TABLET | Freq: Two times a day (BID) | ORAL | 0 refills | Status: DC

## 2022-09-06 NOTE — ED Notes (Signed)
Pt taken to CT.
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
## 2022-09-06 NOTE — ED Notes (Signed)
IV team at the bedside. 

## 2022-09-06 NOTE — Progress Notes (Signed)
Orthopedic Tech Progress Note Patient Details:  Jacqueline Woodard 12/12/1985 [id number]  Applied Shoulder sling. Patient ID: [name], female   DOB: 07-10-1985, 37 y.o.   MRN: [id number]  [name] 09/06/2022, 7:09 PM
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
## 2022-09-06 NOTE — Discharge Instructions (Addendum)
Please take your medications as prescribed. Take tylenol/ibuprofen every 6 hours or Percocet as needed for pain. I recommend close follow-up with PCP for reevaluation.  Please do not hesitate to return to emergency department if worrisome signs symptoms we discussed become apparent.  
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
## 2022-09-13 ENCOUNTER — Ambulatory Visit (INDEPENDENT_AMBULATORY_CARE_PROVIDER_SITE_OTHER): Payer: Self-pay | Admitting: Student

## 2022-09-13 ENCOUNTER — Other Ambulatory Visit: Payer: Self-pay

## 2022-09-13 VITALS — BP 138/98 | HR 114 | Ht 61.0 in | Wt 204.6 lb

## 2022-09-13 DIAGNOSIS — S2001XA Contusion of right breast, initial encounter: Secondary | ICD-10-CM

## 2022-09-13 DIAGNOSIS — G43709 Chronic migraine without aura, not intractable, without status migrainosus: Secondary | ICD-10-CM

## 2022-09-13 DIAGNOSIS — R030 Elevated blood-pressure reading, without diagnosis of hypertension: Secondary | ICD-10-CM

## 2022-09-13 MED ORDER — IBUPROFEN 800 MG PO TABS
800.0000 mg | ORAL_TABLET | Freq: Three times a day (TID) | ORAL | 0 refills | Status: DC | PRN
Start: 2022-09-13 — End: 2022-11-04

## 2022-09-13 MED ORDER — TOPIRAMATE 25 MG PO TABS: 25.0000 mg | ORAL_TABLET | Freq: Two times a day (BID) | ORAL | 1 refills | Status: DC

## 2022-09-13 MED ORDER — OXYCODONE-ACETAMINOPHEN 5-325 MG PO TABS
1.0000 | ORAL_TABLET | Freq: Four times a day (QID) | ORAL | 0 refills | Status: DC | PRN
Start: 2022-09-13 — End: 2022-11-04
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
## 2022-09-13 NOTE — Patient Instructions (Addendum)
It was great to see you! Thank you for allowing me to participate in your care!    Our plans for today:  -For pain, you can continue to take Percocet up to 4 times a day (limit use is much as possible) and 800 mg ibuprofen up to 3 times a day as needed -I refilled her prescription of Topamax.  Titrate up, start with 25 mg at bedtime for 1 week and then can increase to 25 mg twice daily -Go to your scheduled ultrasound -Return in 2 weeks for follow-up visit, schedule this on your way out   Take care and seek immediate care sooner if you develop any concerns.   Dr. Erick Alley, DO [facility]

## 2022-09-14 DIAGNOSIS — I10 Essential (primary) hypertension: Secondary | ICD-10-CM | POA: Insufficient documentation

## 2022-09-14 DIAGNOSIS — S2001XA Contusion of right breast, initial encounter: Secondary | ICD-10-CM | POA: Insufficient documentation

## 2022-09-14 DIAGNOSIS — R030 Elevated blood-pressure reading, without diagnosis of hypertension: Secondary | ICD-10-CM | POA: Insufficient documentation

## 2022-09-14 NOTE — Assessment & Plan Note (Signed)
BP slightly elevated in setting of pain.  No diagnosis of hypertension.  Will follow-up BP in 2 weeks.
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
## 2022-09-17 ENCOUNTER — Other Ambulatory Visit: Payer: Self-pay | Admitting: *Deleted

## 2022-09-17 DIAGNOSIS — S299XXA Unspecified injury of thorax, initial encounter: Secondary | ICD-10-CM

## 2022-09-17 DIAGNOSIS — N644 Mastodynia: Secondary | ICD-10-CM

## 2022-09-24 ENCOUNTER — Telehealth: Payer: Self-pay

## 2022-09-24 NOTE — Telephone Encounter (Signed)
Patient left message on 09/23/2022, requesting return call regarding upcoming appointment. Left message on voicemail requesting return call.

## 2022-09-26 ENCOUNTER — Ambulatory Visit: Payer: Self-pay | Admitting: *Deleted

## 2022-09-26 ENCOUNTER — Other Ambulatory Visit: Payer: Self-pay | Admitting: Obstetrics and Gynecology

## 2022-09-26 ENCOUNTER — Telehealth: Payer: Self-pay | Admitting: Family Medicine

## 2022-09-26 ENCOUNTER — Ambulatory Visit
Admission: RE | Admit: 2022-09-26 | Discharge: 2022-09-26 | Disposition: A | Discharge status: Home / Self Care | Payer: Self-pay | Source: Ambulatory Visit | Attending: Obstetrics and Gynecology | Admitting: Obstetrics and Gynecology

## 2022-09-26 ENCOUNTER — Ambulatory Visit
Admission: RE | Admit: 2022-09-26 | Discharge: 2022-09-26 | Disposition: A | Discharge status: Home / Self Care | Payer: No Typology Code available for payment source | Source: Ambulatory Visit | Attending: Obstetrics and Gynecology | Admitting: Obstetrics and Gynecology

## 2022-09-26 VITALS — BP 122/90 | Wt 203.0 lb

## 2022-09-26 DIAGNOSIS — N644 Mastodynia: Secondary | ICD-10-CM

## 2022-09-26 DIAGNOSIS — Z01419 Encounter for gynecological examination (general) (routine) without abnormal findings: Secondary | ICD-10-CM

## 2022-09-26 DIAGNOSIS — S299XXA Unspecified injury of thorax, initial encounter: Secondary | ICD-10-CM

## 2022-09-26 DIAGNOSIS — N641 Fat necrosis of breast: Secondary | ICD-10-CM

## 2022-09-26 DIAGNOSIS — N6310 Unspecified lump in the right breast, unspecified quadrant: Secondary | ICD-10-CM

## 2022-09-26 NOTE — Patient Instructions (Signed)
Explained breast self awareness with [name]. Pap smear completed. Let her know B[facility]CP will cover Pap smears and HPV typing every 5 years unless has a history of abnormal Pap smears. Referred patient to the [facility] for a diagnostic mammogram. Appointment scheduled Breast Cancer Imaging 09/26/2022 at 3:30 pm. Patient aware of appointment and will be there. Let patient know will follow up with her within the next couple weeks with results of Pap smear by letter or phone. Jacqueline Woodard verbalized understanding.  Tayah Idrovo, Kathaleen Maser, RN 2:40 PM
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
## 2022-09-26 NOTE — Telephone Encounter (Signed)
Form has been placed in your box to be completed, please finish when you have time. Thank you! Teejay Meader CMA  

## 2022-09-26 NOTE — Telephone Encounter (Signed)
patient dropped off form at front desk for [facility].  Verified that patient section of form has been completed.  Last DOS/WCC with PCP was 09/13/22.  Placed form in red team folder to be completed by clinical staff.  Vilinda Blanks

## 2022-09-30 ENCOUNTER — Ambulatory Visit (INDEPENDENT_AMBULATORY_CARE_PROVIDER_SITE_OTHER): Payer: Self-pay | Admitting: Family Medicine

## 2022-09-30 ENCOUNTER — Encounter: Payer: Self-pay | Admitting: Family Medicine

## 2022-09-30 ENCOUNTER — Other Ambulatory Visit: Payer: Self-pay

## 2022-09-30 DIAGNOSIS — M25512 Pain in left shoulder: Secondary | ICD-10-CM

## 2022-09-30 MED ORDER — DICLOFENAC SODIUM 1 % EX GEL
4.0000 g | Freq: Four times a day (QID) | CUTANEOUS | 1 refills | Status: DC
Start: 2022-09-30 — End: 2022-10-31

## 2022-09-30 NOTE — Patient Instructions (Signed)
It was wonderful to see you today.  Please bring ALL of your medications with you to every visit.   Today we talked about: Shoulder pain - use voltaren gel daily. Take ibuprofen with food. In the meantime you should have an appointment with sports medicine.   Thank you for choosing [facility].   Please call [phone] with any questions about today's appointment.   Lockie Mola, MD  Family Medicine
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
## 2022-09-30 NOTE — Assessment & Plan Note (Signed)
For left shoulder pain patient does not have fracture as x-rays were negative for any bony abnormalities.  However suspect that patient has some rotator cuff injury given significant edema and pain with movement.  -Referral to sports medicine for hopefully ultrasound of the shoulder and possible injection if needed  4 breast pain patient did have hematoma and fat necrosis on breast ultrasound.  Will most likely have pain for the next 3 to 6 months. - Voltaren gel - Repeat ultrasound in 3 months

## 2022-10-01 LAB — CYTOLOGY - PAP
Comment: NEGATIVE
Diagnosis: NEGATIVE
High risk HPV: NEGATIVE

## 2022-10-02 ENCOUNTER — Other Ambulatory Visit: Payer: Self-pay

## 2022-10-02 ENCOUNTER — Telehealth: Payer: Self-pay

## 2022-10-02 DIAGNOSIS — B3731 Acute candidiasis of vulva and vagina: Secondary | ICD-10-CM

## 2022-10-02 DIAGNOSIS — B9689 Other specified bacterial agents as the cause of diseases classified elsewhere: Secondary | ICD-10-CM

## 2022-10-02 MED ORDER — METRONIDAZOLE 500 MG PO TABS: 500.0000 mg | ORAL_TABLET | Freq: Two times a day (BID) | ORAL | 0 refills | Status: AC

## 2022-10-02 MED ORDER — FLUCONAZOLE 150 MG PO TABS
150.0000 mg | ORAL_TABLET | Freq: Every day | ORAL | 0 refills | Status: AC
Start: 2022-10-02 — End: 2022-10-03

## 2022-10-02 NOTE — Telephone Encounter (Signed)
Called patient to give pap smear results. Informed patient that pap smear was normal and HPV was negative. Based on this result her next pap smear will be due in 5 years. Patient voiced understanding.  Informed patient that pap was suggestive for BV and yeast. Rx sent for Flagyl BID 7 days and Diflucan to [facility] per patient's request.

## 2022-10-14 ENCOUNTER — Telehealth: Payer: Self-pay
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
## 2022-10-22 NOTE — Telephone Encounter (Signed)
Forms were found in RN box.   Called patient as no other additional information was left.   Patient reports she was told her forms were completed (corrected) and sent in to her employer already.   She asks I leave a copy of up front for her.   Copy made for batch scanning and copy left up front.

## 2022-10-31 ENCOUNTER — Other Ambulatory Visit: Payer: Self-pay

## 2022-10-31 ENCOUNTER — Ambulatory Visit (INDEPENDENT_AMBULATORY_CARE_PROVIDER_SITE_OTHER): Payer: Self-pay | Admitting: Family Medicine

## 2022-10-31 VITALS — BP 130/88 | Ht 61.0 in | Wt 202.0 lb

## 2022-10-31 DIAGNOSIS — M25512 Pain in left shoulder: Secondary | ICD-10-CM

## 2022-10-31 MED ORDER — MELOXICAM 15 MG PO TABS: 15.0000 mg | ORAL_TABLET | Freq: Every day | ORAL | 2 refills | Status: DC
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
## 2022-11-01 ENCOUNTER — Encounter: Payer: Self-pay | Admitting: Family Medicine
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
## 2022-11-04 ENCOUNTER — Inpatient Hospital Stay: Payer: No Typology Code available for payment source | Attending: Obstetrics and Gynecology | Admitting: *Deleted

## 2022-11-04 ENCOUNTER — Other Ambulatory Visit: Payer: Self-pay

## 2022-11-04 VITALS — BP 130/88 | Ht 61.0 in | Wt 198.6 lb

## 2022-11-04 DIAGNOSIS — Z0000 Encounter for general adult medical examination without abnormal findings: Secondary | ICD-10-CM

## 2022-11-04 NOTE — Progress Notes (Signed)
[facility] initial screening      Clinical Measurement:  Vitals:   11/04/22 0831  BP: (!) 132/90   Fasting Labs Drawn Today, will review with patient when they result.   Medical History: Patient states that she does not have high cholesterol, does not have high blood pressure and she does not have diabetes.  Medications: Patient states that she does not take medication to lower cholesterol, blood pressure or blood sugar.  Patient does not take an aspirin a day to help prevent a heart attack or stroke.    Blood pressure, self measurement: Patient states that she does not measure blood pressure from home. She checks her blood pressure N/A. She shares her readings with a health care provider: N/A.   Nutrition: Patient states that on average she eats 1 cups of fruit and 2 cups of vegetables per day. Patient states that she does not eat fish at least 2 times per week. Patient eats less than half servings of whole grains. Patient drinks less than 36 ounces of beverages with added sugar weekly: no. Patient is currently watching sodium or salt intake: yes. In the past 7 days patient has consumed drinks containing alcohol on 0 days. On a day that patient consumes drinks containing alcohol on average 1 drinks are consumed.      Physical activity: Patient states that she gets 420 minutes of moderate and 0 minutes of vigorous physical activity each week.  Smoking status: Patient states that she has has never smoked .   Quality of life: Over the past 2 weeks patient states that she had little interest or pleasure in doing things: several days. She has been feeling down, depressed or hopeless:several days.   Social Determinants of Health Assessment:   Computer Use: During the last 12 months patient states that she has used any of the following: desktop/laptop, smart phone or tablet/other portable wireless computer: yes.   Internet Use: During the last 12 months, did you or any member of your household  have access to the internet: Yes, by paying a cell phone company or internet service provider.   Food Insecurities: During the last 12 months, where there any times when you were worried that you would run out of food because of a lack of money or other resources: No.   Transportation Barriers: During the last 12 months, have you missed a doctor's appointment because of transportation problems: No.   Childcare Barriers: If you are currently using childcare services, please identify  the type of services you use. (If not using childcare services, please select "Not applicable"): not applicable. During the last 12 months, have you had any barriers to childcare services such as: not applicable.   Housing: What is your housing situation today: I have housing.   Intimate Partner Violence: During the last 12 months, how often did your partner physically hurt you: never. During the last 12 months, how often did your partner insult you or talk down to you: never.  Medication Adherence: During the last 12 months, did you ever forget to take your medicine: No. During the last 12 months, were you careless ar times about taking your medicine: No. During the last 12 months, when you felt better did you sometimes stop taking your medication: No. During the last 12 months, sometimes if you felt worse when you took your medicine did you stop taking it: No.   Risk reduction and counseling:   Health Coaching: Spoke with patient about the daily recommendation for  fruits and vegetables. Showed patient what a serving size would look like. Gave patient a portion container and my plate to use for preparing meals at home. Spoke with patient about adding more heart healthy fish into diet. Gave suggestions for salmon, tuna, mackerel, sardines, sea bass and trout. Patient consumes breakfast cereals but not a lot of other whole grains. Gave suggestions for whole wheat bread, oatmeal, brown rice and whole wheat pasta. Patient  consumes a soda a day. Spoke with patient about cutting back on the amount of beverages with added sugar. Patient does a good amount of walking with her job daily. Encouraged patient to try and start devoting time specifically for exercising.   Goal:  Patient will try and start exercising two days a week for 20-30 minutes. Patient will work on reaching this goal over the next month. Once goal has been achieved patient will increase the days per week of exercising.    Navigation:  I will notify patient of lab results.  Patient is aware of 2 more health coaching sessions and a follow up.  Time: 20 minutes

## 2022-11-05 LAB — LIPID PANEL
Chol/HDL Ratio: 4 ratio (ref 0.0–4.4)
Cholesterol, Total: 178 mg/dL (ref 100–199)
HDL: 44 mg/dL (ref >39)
LDL Chol Calc (NIH): 118 mg/dL — ABNORMAL HIGH (ref 0–99)
Triglycerides: 85 mg/dL (ref 0–149)
VLDL Cholesterol Cal: 16 mg/dL (ref 5–40)

## 2022-11-05 LAB — GLUCOSE, RANDOM: Glucose: 79 mg/dL (ref 70–99)

## 2022-11-05 LAB — HEMOGLOBIN A1C
Est. average glucose Bld gHb Est-mCnc: 120 mg/dL
Hgb A1c MFr Bld: 5.8 % — ABNORMAL HIGH (ref 4.8–5.6)

## 2022-11-11 ENCOUNTER — Telehealth: Payer: Self-pay

## 2022-11-11 NOTE — Telephone Encounter (Signed)
Health coaching 2   interpreter- Natale Lay, [employer]   Labs-cholesterol , LDL cholesterol , triglycerides , HDL cholesterol , hemoglobin A1C , mean plasma glucose   Patient understands and is aware of her lab results.   Goals-  1. Practice heart healthy diet by watching the amount of fried and fatty foods consumed. Increase daily fruit and vegetable intake. Increase whole grain intake. Reduce the amount of red meat consumed. 2. Watch the amount of sweet and sugary foods and drinks consumed. Watch the amount of carbohydrate rich foods consumed.  3. Daily exercise for at least 20-30 minutes daily.   Navigation:  Patient is aware of 1 more health coaching sessions and a follow up.  Patient is [facility].  Time- 10 minutes

## 2022-11-22 ENCOUNTER — Ambulatory Visit: Payer: No Typology Code available for payment source | Admitting: Pharmacist

## 2022-11-22 ENCOUNTER — Ambulatory Visit (INDEPENDENT_AMBULATORY_CARE_PROVIDER_SITE_OTHER): Payer: Self-pay | Admitting: Family Medicine

## 2022-11-22 ENCOUNTER — Encounter: Payer: Self-pay | Admitting: Family Medicine

## 2022-11-22 ENCOUNTER — Other Ambulatory Visit: Payer: Self-pay

## 2022-11-22 DIAGNOSIS — F418 Other specified anxiety disorders: Secondary | ICD-10-CM

## 2022-11-22 DIAGNOSIS — Z91013 Allergy to seafood: Secondary | ICD-10-CM

## 2022-11-22 DIAGNOSIS — J4520 Mild intermittent asthma, uncomplicated: Secondary | ICD-10-CM

## 2022-11-22 DIAGNOSIS — G43709 Chronic migraine without aura, not intractable, without status migrainosus: Secondary | ICD-10-CM

## 2022-11-22 MED ORDER — VENLAFAXINE HCL ER 75 MG PO CP24
75.0000 mg | ORAL_CAPSULE | Freq: Every day | ORAL | 3 refills | Status: AC
Start: 2022-11-22 — End: ?

## 2022-11-22 MED ORDER — LIDOCAINE 5 % EX PTCH: 1.0000 | MEDICATED_PATCH | CUTANEOUS | 0 refills | Status: DC

## 2022-11-22 MED ORDER — ALBUTEROL SULFATE HFA 108 (90 BASE) MCG/ACT IN AERS: INHALATION_SPRAY | RESPIRATORY_TRACT | 2 refills | Status: AC

## 2022-11-22 MED ORDER — TOPIRAMATE 25 MG PO TABS
25.0000 mg | ORAL_TABLET | Freq: Two times a day (BID) | ORAL | 1 refills | Status: AC
Start: 2022-11-22 — End: ?

## 2022-11-22 MED ORDER — BUDESONIDE-FORMOTEROL FUMARATE 80-4.5 MCG/ACT IN AERO
INHALATION_SPRAY | RESPIRATORY_TRACT | 1 refills | Status: AC
Start: 2022-11-22 — End: ?

## 2022-11-22 MED ORDER — SUMATRIPTAN SUCCINATE 50 MG PO TABS
50.0000 mg | ORAL_TABLET | ORAL | 1 refills | Status: AC | PRN
Start: 2022-11-22 — End: ?

## 2022-11-22 MED ORDER — EPINEPHRINE 0.15 MG/0.3ML IJ SOAJ
0.1500 mg | INTRAMUSCULAR | 1 refills | Status: DC | PRN
Start: 2022-11-22 — End: 2022-11-27
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
## 2022-11-22 NOTE — Patient Instructions (Signed)
Please use the lidocaine patches and follow up for pain as needed. I will follow up the blood pressure readings with Dr. Raymondo Band.

## 2022-11-23 NOTE — Assessment & Plan Note (Signed)
Refilled sumatriptan 

## 2022-11-23 NOTE — Assessment & Plan Note (Signed)
Improving pec pain however, would benefit greatly from PT.  - Patient says she is now able to schedule with PT  - Follow up with Sports med  - Lidocaine patch and voltaren gel ordered  - Wrote letter for job to offer breaks while working

## 2022-11-23 NOTE — Assessment & Plan Note (Signed)
Refilled patient's inhalers. Well controlled at this time.

## 2022-11-23 NOTE — Assessment & Plan Note (Signed)
PHQ 9 stable with 11. Patient reports she is using her coping skills and continuing counseling. Likes the medications she is on now. Does not want any changes.  - Follow up in one to two months.

## 2022-11-25 ENCOUNTER — Other Ambulatory Visit: Payer: Self-pay | Admitting: Family Medicine

## 2022-11-25 ENCOUNTER — Telehealth: Payer: Self-pay

## 2022-11-25 DIAGNOSIS — Z91013 Allergy to seafood: Secondary | ICD-10-CM

## 2022-11-25 NOTE — Telephone Encounter (Signed)
Pharmacy Patient Advocate Encounter   Received notification from CoverMyMeds that prior authorization for LIDOCAINE 5% PATCHES is required/requested.   Insurance verification completed.   The patient is insured through [facility] .   Per test claim: PA required; PA submitted to [employer] [facility] via CoverMyMeds Key/confirmation #/EOC [id number]. Status is pending

## 2022-11-26 ENCOUNTER — Encounter: Payer: No Typology Code available for payment source | Admitting: Student

## 2022-11-27 ENCOUNTER — Ambulatory Visit (INDEPENDENT_AMBULATORY_CARE_PROVIDER_SITE_OTHER): Payer: Self-pay | Admitting: Pharmacist

## 2022-11-27 ENCOUNTER — Encounter: Payer: Self-pay | Admitting: Pharmacist

## 2022-11-27 VITALS — BP 129/91 | Ht 61.75 in | Wt 194.8 lb

## 2022-11-27 DIAGNOSIS — R030 Elevated blood-pressure reading, without diagnosis of hypertension: Secondary | ICD-10-CM

## 2022-11-27 NOTE — Assessment & Plan Note (Signed)
No official diagnosis for hypertension - no history of taking any blood pressure medications; goal presssure of <130/80 mm Hg.     -Placed blood pressure cuff, provided education, patient instructed to wear cuff for 24 hours and return tomorrow to review results.
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
## 2022-11-27 NOTE — Patient Instructions (Signed)
Blood Pressure Activity Diary Time Lying down/ Sleeping Walking/ Exercise Stressed/ Angry Headache/ Pain Dizzy  9 AM       10 AM       11 AM       12 PM       1 PM       2 PM       Time Lying down/ Sleeping Walking/ Exercise Stressed/ Angry Headache/ Pain Dizzy  3 PM       4 PM        5 PM       6 PM       7 PM       8 PM       Time Lying down/ Sleeping Walking/ Exercise Stressed/ Angry Headache/ Pain Dizzy  9 PM       10 PM       11 PM       12 AM       1 AM       2 AM       3 AM       Time Lying down/ Sleeping Walking/ Exercise Stressed/ Angry Headache/ Pain Dizzy  4 AM       5 AM       6 AM       7 AM       8 AM       9 AM       10 AM        Time you woke up: _________                  Time you went to sleep:__________  Come back tomorrow at 8:30 a.m. to have the monitor removed  Call the [facility] if you have any questions before then [phone])  Wearing the Blood Pressure Monitor The cuff will inflate every 20 minutes during the day and every 30 minutes while you sleep. Fill out the blood pressure-activity diary during the day, especially during activities that may affect your reading -- such as exercise, stress, walking, taking your blood pressure medications  Important things to know: Avoid taking the monitor off for the next 24 hours, unless it causes you discomfort or pain. Do NOT get the monitor wet and do NOT try to clean the monitor with any cleaning products. Do NOT put the monitor on anyone else's arm. When the cuff inflates, avoid excess movement. Let the cuffed arm hang loosely, slightly away from the body. Avoid flexing the muscles or moving the hand/fingers. Remember to fill out the blood pressure activity diary. If you experience severe pain or unusual pain (not associated with getting your blood pressure checked), remove the monitor.  Troubleshooting:  Code  Troubleshooting   1  Check cuff position, tighten cuff   2, 3  Remain  still during reading   4, 87  Check air hose connections and make sure cuff is tight   85, 89  Check hose connections and make tubing is not crimped   86  Push START/STOP to restart reading   88, 91  Retry by pushing START/STOP   90  Replace batteries. If problem persists, remove monitor and bring back to   clinic at follow up   97, 98, 99  Service required - Remove monitor and bring back to clinic at follow up

## 2022-11-28 ENCOUNTER — Encounter: Payer: Self-pay | Admitting: Pharmacist

## 2022-11-28 ENCOUNTER — Other Ambulatory Visit (HOSPITAL_COMMUNITY): Payer: Self-pay

## 2022-11-28 ENCOUNTER — Ambulatory Visit (INDEPENDENT_AMBULATORY_CARE_PROVIDER_SITE_OTHER): Payer: Self-pay | Admitting: Pharmacist

## 2022-11-28 VITALS — BP 134/88

## 2022-11-28 DIAGNOSIS — R030 Elevated blood-pressure reading, without diagnosis of hypertension: Secondary | ICD-10-CM

## 2022-11-28 MED ORDER — AMLODIPINE BESYLATE-VALSARTAN 5-160 MG PO TABS
1.0000 | ORAL_TABLET | Freq: Every day | ORAL | 1 refills | Status: DC
Start: 2022-11-28 — End: 2022-12-20

## 2022-11-28 NOTE — Telephone Encounter (Signed)
Pharmacy Patient Advocate Encounter  Received notification from [facility] that Prior Authorization for LIDOCAINE 5% PATCHES has been DENIED.  Full denial letter will be uploaded to the media tab. See denial reason below.    Lidocaine 4% patches are available OTC

## 2022-11-28 NOTE — Assessment & Plan Note (Signed)
History of elevated in office readings she has not been diagnosed previously with hypertension.  Her goal presssure is <130/80 mmHg.  Found to have persistently elevated and uncontrolled blood pressures with 24-hour ambulatory blood pressure evaluation which demonstrates an average AWAKE blood pressure of 134/88 mmHg. Nocturnal dipping pattern is normal.   Changes to medications -Started amlodipine/valsartan 5/160 mg once daily.  (Listed as preferred level 10 on formulary).  Instructed to call back for individual components if cost was high for this combination tablet).  Patient educated on purpose, proper use and potential adverse effects.   Following instruction patient verbalized understanding of treatment plan.  -Patient would be willing to monitor with home monitor however notes that her medical insurance will not cover.  -BMET at follow-up

## 2022-11-28 NOTE — Patient Instructions (Signed)
It was nice to see you today!  Your blood pressure report showed slightly increased readings - mostly in the afternoon and evening.   Your goal blood pressure is  <130/80 mmHg.  Medication Changes: START - Amlodipine/Valsartan - 1 pill daily  Continue all other medication the same.   Monitor blood pressure with the help of someone at work.   Keep up the good work with diet and exercise. Aim for a diet full of vegetables, fruit and lean meats (chicken, [place], fish). Try to limit salt intake by eating fresh or frozen vegetables (instead of canned), rinse canned vegetables prior to cooking and do not add any additional salt to meals.

## 2022-11-28 NOTE — Progress Notes (Signed)
   S:     Chief Complaint  Patient presents with   Medication Management    Ambulatory BP - Day 2   37 y.o. female who presents for hypertension evaluation, education, and management.  Patient arrives in good spirits and presents without any assistance.    Patient returns to clinic with 24 hour blood pressure monitor and reports she is "done with the monitor". Patient reports they were able to wear the Ambulatory Blood Pressure Cuff for the entire 24 evaluation period.   O:  Review of Systems  All other systems reviewed and are negative.   Physical Exam  Last 3 Office BP readings: BP Readings from Last 3 Encounters:  11/28/22 134/88  11/27/22 (!) 129/91  11/22/22 138/88    Basic Metabolic Panel    Component Value Date/Time   NA 138 09/06/2022 1248   NA 142 03/16/2021 0935   K 3.7 09/06/2022 1248   CL 106 09/06/2022 1248   CO2 22 11/13/2021 2322   GLUCOSE 79 11/04/2022 0909   GLUCOSE 77 09/06/2022 1248   BUN 11 09/06/2022 1248   BUN 10 03/16/2021 0935   CREATININE 1.00 09/06/2022 1248   CREATININE 1.21 (H) 12/16/2013 1015   CALCIUM 9.0 11/13/2021 2322   GFRNONAA >60 11/13/2021 2322   GFRNONAA 61 12/16/2013 1015   GFRAA 91 02/28/2020 0911   GFRAA 70 12/16/2013 1015     ABPM Study Data: Arm Placement left arm  Overall Mean 24hr BP:   130/84 mmHg  HR: 91  Daytime Mean BP:  134/88 mmHg  HR: 94  Nighttime Mean BP:  120/74 mmHg  HR: 83  Dipping Pattern: Yes.    Sys:   11%   Dia: 16.2%   [normal dipping ~10-20%]   For Office Goal BP of <130/80 mmHg:  ABPM thresholds: Overall BP <125/75 mmHg, daytime BP <130/80 mmHg, sleeptime BP <110/65 mmHg    A/P: History of elevated in office readings she has not been diagnosed previously with hypertension.  Her goal presssure is <130/80 mmHg.  Found to have persistently elevated and uncontrolled blood pressures with 24-hour ambulatory blood pressure evaluation which demonstrates an average AWAKE blood pressure of 134/88  mmHg. Nocturnal dipping pattern is normal.   Changes to medications -Started amlodipine/valsartan 5/160 mg once daily.  (Listed as preferred level 10 on formulary).  Instructed to call back for individual components if cost was high for this combination tablet).  Patient educated on purpose, proper use and potential adverse effects.   Following instruction patient verbalized understanding of treatment plan.  -Patient would be willing to monitor with home monitor however notes that her medical insurance will not cover.  -BMET at follow-up  Results reviewed, copy of results and written information provided.    Written patient instructions provided. Patient verbalized understanding of treatment plan.  Total time in face to face counseling 18 minutes.    Follow-up:  Pharmacist ~ 3 weeks  PCP clinic visit in PRN  Patient seen with: Alesia Banda, PharmD Candidate and Andee Poles, PharmD Candidate.

## 2022-11-29 NOTE — Progress Notes (Signed)
Reviewed and agree with Dr Koval's plan.   

## 2022-12-12 ENCOUNTER — Ambulatory Visit: Payer: Self-pay | Admitting: Family Medicine

## 2022-12-18 ENCOUNTER — Encounter: Payer: Self-pay | Admitting: Family Medicine

## 2022-12-19 ENCOUNTER — Encounter: Payer: Self-pay | Admitting: Pharmacist

## 2022-12-19 ENCOUNTER — Ambulatory Visit (INDEPENDENT_AMBULATORY_CARE_PROVIDER_SITE_OTHER): Payer: Self-pay | Admitting: Pharmacist

## 2022-12-19 VITALS — BP 107/78 | HR 82 | Wt 203.0 lb

## 2022-12-19 DIAGNOSIS — I10 Essential (primary) hypertension: Secondary | ICD-10-CM

## 2022-12-19 DIAGNOSIS — Z91013 Allergy to seafood: Secondary | ICD-10-CM

## 2022-12-19 DIAGNOSIS — G43709 Chronic migraine without aura, not intractable, without status migrainosus: Secondary | ICD-10-CM

## 2022-12-19 MED ORDER — ONDANSETRON HCL 4 MG PO TABS
4.0000 mg | ORAL_TABLET | Freq: Three times a day (TID) | ORAL | 1 refills | Status: DC | PRN
Start: 2022-12-19 — End: 2023-08-13

## 2022-12-19 MED ORDER — EPINEPHRINE 0.3 MG/0.3ML IJ SOAJ
0.3000 mg | INTRAMUSCULAR | 1 refills | Status: AC | PRN
Start: 2022-12-19 — End: ?

## 2022-12-19 NOTE — Assessment & Plan Note (Signed)
Hypertension diagnosed mid September currently well-controlled on current medication. Elevated readings at home concern for callibration issue with home monitor. BP goal < 130/80  mmHg. Medication adherence appears good.  - No medication adjustments -Counseled on lifestyle modifications for blood pressure control including reduced dietary sodium, increased exercise, adequate sleep. -Encouraged patient to check BP at home and bring log of readings to next visit. Counseled on proper use of home BP cuff.

## 2022-12-19 NOTE — Patient Instructions (Addendum)
It was nice to see you today!  Your goal blood pressure is <130/80 mmHg.  Medication Changes:  Continue all other medication the same.   Monitor blood pressure at home daily and keep a log (on your phone or piece of paper) to bring with you to your next visit. Write down date, time, blood pressure and pulse.  Keep up the good work with diet and exercise. Aim for a diet full of vegetables, fruit and lean meats (chicken, [place], fish). Try to limit salt intake by eating fresh or frozen vegetables (instead of canned), rinse canned vegetables prior to cooking and do not add any additional salt to meals.

## 2022-12-19 NOTE — Progress Notes (Addendum)
   S:     Chief Complaint  Patient presents with   Medication Management    HTN   37 y.o. female who presents for hypertension evaluation, education, and management.  PMH is significant for HTN, migraines.  Patient was referred and last seen by Primary Care Provider, Dr. Marsh Dolly, on 11/22/2022.   At last visit, patient was advised on adherence.  Today, patient arrives in good spirits and presents without assistance. Denies dizziness blurred vision, swelling. Migraines occasionally, managed with medication regimen.  PMH: HTN, Migraines  Family/Social history: HTN on both sides of family  Medication adherence good. Patient has not taken BP medications today.   Current antihypertensives include: amlodipine-valsartan 5-160 mg daily  Reported home BP readings: 130s-140s/90s-100s while sitting. 150/101 while walking  Patient-reported exercise habits: Twice weekly 30-45 min walks.  ASCVD risk factors include: HTN  O:  Review of Systems  Musculoskeletal:  Positive for joint pain (left shoulder pain improving).    Physical Exam Constitutional:      Appearance: Normal appearance.  Pulmonary:     Effort: Pulmonary effort is normal.  Neurological:     Mental Status: She is alert.  Psychiatric:        Mood and Affect: Mood normal.        Behavior: Behavior normal.        Thought Content: Thought content normal.        Judgment: Judgment normal.     Last 3 Office BP readings: BP Readings from Last 3 Encounters:  12/19/22 107/78  11/28/22 134/88  11/27/22 (!) 129/91    BMET    Component Value Date/Time   NA 138 09/06/2022 1248   NA 142 03/16/2021 0935   K 3.7 09/06/2022 1248   CL 106 09/06/2022 1248   CO2 22 11/13/2021 2322   GLUCOSE 79 11/04/2022 0909   GLUCOSE 77 09/06/2022 1248   BUN 11 09/06/2022 1248   BUN 10 03/16/2021 0935   CREATININE 1.00 09/06/2022 1248   CREATININE 1.21 (H) 12/16/2013 1015   CALCIUM 9.0 11/13/2021 2322   GFRNONAA >60 11/13/2021 2322    GFRNONAA 61 12/16/2013 1015   GFRAA 91 02/28/2020 0911   GFRAA 70 12/16/2013 1015     Clinical ASCVD: No  The ASCVD Risk score (Arnett DK, et al., 2019) failed to calculate for the following reasons:   The 2019 ASCVD risk score is only valid for ages 57 to 13  A/P: Hypertension diagnosed mid September currently well-controlled on current medication. Elevated readings at home concern for callibration issue with home monitor. BP goal < 130/80  mmHg. Medication adherence appears good.  - No medication adjustments -Counseled on lifestyle modifications for blood pressure control including reduced dietary sodium, increased exercise, adequate sleep.  We spent time discussing health food options and portion control to help with weight loss.  -Encouraged patient to check BP at home and bring log of readings to next visit. Counseled on proper use of home BP cuff.   Patient requested refills on ondansetron and updated prescription for epi-pen sent to preferred local pharmacy  Results reviewed and written information provided.    Written patient instructions provided. Patient verbalized understanding of treatment plan.  Total time in face to face counseling 24 minutes.    Follow-up:  Pharmacist 01/21/2023. PCP clinic visit PRN.  Patient seen with Caprice Beaver, PharmD Candidate and Shona Simpson, PharmD Candidate.

## 2022-12-20 ENCOUNTER — Other Ambulatory Visit: Payer: Self-pay | Admitting: Family Medicine

## 2022-12-20 DIAGNOSIS — R030 Elevated blood-pressure reading, without diagnosis of hypertension: Secondary | ICD-10-CM

## 2022-12-23 NOTE — Progress Notes (Signed)
Reviewed and agree with Dr Koval's plan.   

## 2022-12-31 ENCOUNTER — Ambulatory Visit
Admission: RE | Admit: 2022-12-31 | Discharge: 2022-12-31 | Disposition: A | Discharge status: Home / Self Care | Payer: PRIVATE HEALTH INSURANCE | Source: Ambulatory Visit | Attending: Obstetrics and Gynecology | Admitting: Obstetrics and Gynecology

## 2022-12-31 DIAGNOSIS — N641 Fat necrosis of breast: Secondary | ICD-10-CM

## 2022-12-31 DIAGNOSIS — S299XXA Unspecified injury of thorax, initial encounter: Secondary | ICD-10-CM

## 2022-12-31 DIAGNOSIS — N644 Mastodynia: Secondary | ICD-10-CM

## 2023-01-21 ENCOUNTER — Ambulatory Visit: Payer: Self-pay | Admitting: Pharmacist

## 2023-02-03 ENCOUNTER — Telehealth: Payer: Self-pay | Admitting: Pharmacist

## 2023-02-03 NOTE — Telephone Encounter (Signed)
Attempted to contact patient for follow-up of BP F/U appointment missed on 01/31/2023. Records indicate patient is nonadherent to BP medication.   Left HIPAA compliant voice mail requesting call back to office phone to schedule appointment with Dr. Raymondo Band: [phone]  Total time with patient call and documentation of interaction: 4 minutes.  Follow-up phone call planned: None

## 2023-03-20 ENCOUNTER — Encounter (HOSPITAL_COMMUNITY): Payer: Self-pay | Admitting: Emergency Medicine

## 2023-03-20 ENCOUNTER — Ambulatory Visit (HOSPITAL_COMMUNITY): Payer: Commercial Managed Care - PPO

## 2023-03-20 ENCOUNTER — Other Ambulatory Visit: Payer: Self-pay

## 2023-03-20 ENCOUNTER — Ambulatory Visit (HOSPITAL_COMMUNITY)
Admission: EM | Admit: 2023-03-20 | Discharge: 2023-03-20 | Disposition: A | Discharge status: Home / Self Care | Payer: Commercial Managed Care - PPO | Attending: Physician Assistant | Admitting: Physician Assistant

## 2023-03-20 DIAGNOSIS — M25571 Pain in right ankle and joints of right foot: Secondary | ICD-10-CM

## 2023-03-20 DIAGNOSIS — S93601A Unspecified sprain of right foot, initial encounter: Secondary | ICD-10-CM | POA: Diagnosis not present

## 2023-03-20 DIAGNOSIS — M79671 Pain in right foot: Secondary | ICD-10-CM

## 2023-03-20 MED ORDER — IBUPROFEN 800 MG PO TABS: 800.0000 mg | ORAL_TABLET | Freq: Three times a day (TID) | ORAL | 0 refills | Status: DC

## 2023-03-20 NOTE — ED Triage Notes (Signed)
 Pt is c/o right foot pain since 03/11/2023, states is constant pain and getting worse. Denies any fall or injury.
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
## 2023-03-20 NOTE — Discharge Instructions (Signed)
 I did not see any broken bones or anything out of place on your x-ray.  I will contact you tomorrow if the radiologist sees something and we need to change your plan.  Use the postop shoe for comfort and support.  Keep your foot elevated is much as possible.  Take ibuprofen  800 mg for pain relief.  Do not take NSAIDs with this medication including aspirin, ibuprofen /Advil , naproxen /Aleve .  Please follow-up with podiatry ([facility]) as soon as possible.  Call them to schedule an appointment first thing tomorrow.  If anything worsens and you have increasing pain, redness, swelling, numbness or tingling in your foot, chest pain, shortness of breath you need to go to the emergency room immediately.

## 2023-03-31 ENCOUNTER — Encounter: Payer: Self-pay | Admitting: Podiatry

## 2023-03-31 ENCOUNTER — Ambulatory Visit (INDEPENDENT_AMBULATORY_CARE_PROVIDER_SITE_OTHER): Payer: Commercial Managed Care - PPO | Admitting: Podiatry

## 2023-03-31 ENCOUNTER — Ambulatory Visit (INDEPENDENT_AMBULATORY_CARE_PROVIDER_SITE_OTHER): Payer: Commercial Managed Care - PPO

## 2023-03-31 DIAGNOSIS — R2241 Localized swelling, mass and lump, right lower limb: Secondary | ICD-10-CM

## 2023-03-31 DIAGNOSIS — M778 Other enthesopathies, not elsewhere classified: Secondary | ICD-10-CM | POA: Diagnosis not present

## 2023-03-31 DIAGNOSIS — M722 Plantar fascial fibromatosis: Secondary | ICD-10-CM

## 2023-03-31 MED ORDER — METHYLPREDNISOLONE 4 MG PO TBPK: ORAL_TABLET | ORAL | 0 refills | Status: DC

## 2023-03-31 NOTE — Patient Instructions (Signed)
 Unna Boot Care-remove in 5 days or sooner if there is any increased pain, swelling, toe discoloration, or any issues  An Radio broadcast assistant is a type of bandage (dressing) for the foot and leg. The dressing is a wrap made of gauze that is soaked with a medicine called zinc oxide. The gauze may also include other lotions and medicines that help in wound healing, such as calamine. An Unna boot may be used to: Treat open sores (venous ulcers) or graft sites on the foot, heel, or leg. Help with swelling from conditions that affect the veins or lymphatic system (lymphedema). Treat skin conditions such as inflammation caused by poor blood flow (stasis dermatitis). Heal wounds on parts of the body below the hips (lower extremities). The dressing is applied by a health care provider. The gauze is wrapped around your lower extremity in several layers that overlap. These layers usually start at the toes and go up to the knee. A dry outer wrap goes over the medicated wrap for support and pressure (compression). Before applying the Foot locker, your health care provider will clean your leg and foot and may apply an antibiotic. You may be asked to raise (elevate) your leg for a while to reduce swelling before the boot is put on. The boot will dry and harden after it is applied. It may need to be changed or replaced once or twice a week. Follow these instructions at home: Boot care Wear the Foot locker as told by your health care provider. You may need to wear a slipper or shoe over the boot that is one or two sizes larger than normal. Do not stick anything inside the boot to scratch your skin. Doing that increases your risk of infection. Check the skin around the boot every day. Tell your health care provider about any concerns. Keep your Foot locker clean and dry. Check the area around the boot every day for signs of infection. Check for: Redness, swelling, or pain in your foot or toes. Fluid or blood coming from the  boot. Warmth. Pus or a bad smell. A rash, itching, or red, swollen areas of skin (hives). Remove the boot and call your health care provider if you have signs of poor blood flow, such as: Your toes tingle or become numb. Your toes turn cold or turn blue or pale. Your toes are more swollen or painful. You cannot move your toes. Bathing Do not take baths, swim, or use a hot tub until your health care provider approves. Ask your health care provider if you may take showers. You may only be allowed to take sponge baths. If your health care provider says that you can take a bath or shower: Do not let the Unna boot get wet. Cover the boot with a watertight covering when you shower. Keep your leg with the boot out of the bathtub when you take a bath. Activity Rest as told by your health care provider. Do not sit for a long time without moving. Get up to take short walks every 1-2 hours. This will improve blood flow and breathing. Ask for help if you feel weak or unsteady. You may walk with the boot once it has dried. Ask your health care provider how much walking is safe for you. General instructions Take over-the-counter and prescription medicines only as told by your health care provider. Keep your leg elevated above the level of your heart while you are sitting or lying down. This will decrease swelling. Do not  sit with your knee bent for long periods of time. Do not use any products that contain nicotine or tobacco. These products include cigarettes, chewing tobacco, and vaping devices, such as e-cigarettes. If you need help quitting, ask your health care provider. Keep all follow-up visits. Your health care provider will change your boot once or twice a week until it is no longer needed. Contact a health care provider if: Your skin feels itchy inside the boot. You feel burning or have a rash or hives in the boot area. You have a fever or chills. You have any signs of infection. You have  more numbness or pain in your foot or toes. The skin on your foot or toes changes colors. This may include the skin turning blue or pale or having patchy areas with spots. Your boot has been damaged or feels like it no longer fits like it should. This information is not intended to replace advice given to you by your health care provider. Make sure you discuss any questions you have with your health care provider. Document Revised: 07/30/2021 Document Reviewed: 07/30/2021 Elsevier Patient Education  2024 [employer].

## 2023-03-31 NOTE — Progress Notes (Signed)
  Subjective:  Patient ID: Jacqueline Woodard, female    DOB: 10/12/1970,  MRN: [id number]  No chief complaint on file.   Discussed the use of AI scribe software for clinical note transcription with the patient, who gave verbal consent to proceed.  History of Present Illness           38 y.o. female with a history of edema in the legs, presents with foot pain and swelling that began on December 24th. They report that the pain started after a day of work and grocery shopping, and initially attributed it to being on their feet too much. Over the following days, the pain and swelling worsened, making it difficult to wear socks and causing them to drag their foot. After a week, the foot started going numb, prompting a visit to urgent care. X-rays at urgent care did not reveal any abnormalities, but due to the pain, swelling, and numbness, the patient was placed in a surgical shoe and referred for further evaluation. The boot has helped with mobility, but the foot remains swollen and painful. The patient denies any recent changes in activity or injury to the foot. They also deny any heat or warmth over the area. They have been wearing compression socks for the leg edema, but have not had any other recent treatments for the swelling. Denies any chest pain or SOB.    Objective:    Physical Exam         General: AAO x3, NAD  Dermatological: Skin is warm, dry and supple bilateral There are no open sores, no preulcerative lesions, no rash or signs of infection present.  Vascular: Difficult to palpate pulses given swelling.  Immediate cap refill time to toes noted.  Right foot is warm and well-perfused.  Edema present to the right lower extremity.  There is no pain with calf compression, erythema or warmth.  Neruologic: Grossly intact via light touch bilateral.   Musculoskeletal: Majority tenderness is localized to the dorsal aspect of the forefoot on the central metatarsals as well as the plantar  aspect of heel and insertion of plantar fascia.  Flexor, extensor tendons appear to be intact but difficult to evaluate given the swelling. There is no erythema, or increased temperature to the foot/leg noted.       Assessment:   Swelling right lower extremity  Plan:  Patient was evaluated and treated and all questions answered.  Assessment and Plan  Foot Pain and Swelling Acute onset of foot pain and swelling without a clear injury. Pain localized to the metatarsals and heel. No signs of infection. X-rays were negative in the ER. -Repeat x-rays of the foot and ankle were obtained.  Multiple views were obtained.  No evidence of acute fracture.  Swelling noted. -Apply Uniboot for compression and to reduce swelling.  Will advise when to remove this. -Order short course of steroids to reduce inflammation and pain.  Prescribe Medrol  Dosepak. -Continue immobilization with surgical shoe or boot. -Order ultrasound of the leg to rule out deep vein thrombosis.  Chronic Lower Extremity Edema Chronic lower extremity edema managed with compression socks. -Continue compression socks when swelling improves. -Advise patient to monitor salt intake.  Follow-up in 10 days to reassess foot pain and swelling.    Donnice JONELLE Fees DPM

## 2023-04-07 ENCOUNTER — Ambulatory Visit (HOSPITAL_COMMUNITY)
Admission: RE | Admit: 2023-04-07 | Discharge: 2023-04-07 | Disposition: A | Discharge status: Home / Self Care | Payer: Commercial Managed Care - PPO | Source: Ambulatory Visit | Attending: Podiatry | Admitting: Podiatry

## 2023-04-07 ENCOUNTER — Encounter: Payer: Self-pay | Admitting: Podiatry

## 2023-04-07 DIAGNOSIS — R2241 Localized swelling, mass and lump, right lower limb: Secondary | ICD-10-CM | POA: Insufficient documentation

## 2023-04-10 ENCOUNTER — Encounter: Payer: Self-pay | Admitting: Podiatry

## 2023-04-10 ENCOUNTER — Ambulatory Visit (INDEPENDENT_AMBULATORY_CARE_PROVIDER_SITE_OTHER): Payer: Commercial Managed Care - PPO | Admitting: Podiatry

## 2023-04-10 DIAGNOSIS — R2241 Localized swelling, mass and lump, right lower limb: Secondary | ICD-10-CM

## 2023-04-10 DIAGNOSIS — S96911A Strain of unspecified muscle and tendon at ankle and foot level, right foot, initial encounter: Secondary | ICD-10-CM

## 2023-04-10 NOTE — Progress Notes (Signed)
Subjective: Chief Complaint  Patient presents with   Foot Pain    RM#11 Patient states here for follow up on right foot still hurting unable to wear regular shoes still causing pain and discomfort.   38 year old female presents the office above concerns.  She states that the [employer] did feel good but after she took it off the swelling came back within 24 hours.  She does not recall any injuries from this started.  She has had chronic swelling to her legs previously but this is been worse than normal.  She has not yet followed up with her PCP for this.  Objective: AAO x3, NAD DP/PT pulses palpable bilaterally, CRT less than 3 seconds There is edema present in the right lower extremity particularly on the dorsal foot, ankle as well as the leg.  There is no pain with calf compression.  No erythema or warmth.  There is no open lesions or any signs of infection.  She does today get tenderness left lateral aspect ankle in the course of the peroneal tendon, sinus tarsi as well as the ankle, foot.  There is no specific area pinpoint tenderness otherwise. No pain with calf compression, swelling, warmth, erythema  Assessment: Chronic edema although worsening recently; pain  Plan: -All treatment options discussed with the patient including all alternatives, risks, complications.  -I debrided that was applied today.  Please revise wondering if this.  Will plan on changing this next week.  If she takes it off early to wrap the legs with Ace bandages that she is not able to wear the compression socks. -I ordered MRI of the right foot and ankle to further evaluate.  This is to rule out any other underlying soft tissue pathology. -I also encouraged her to follow-up with her PCP to make sure there is no other causes for the lower extremity edema on the right.  Although she does have chronic swelling this has been worsening only on the right side. -Venous duplex reviewed which was negative. -Patient  encouraged to call the office with any questions, concerns, change in symptoms.   Vivi Barrack DPM

## 2023-04-15 ENCOUNTER — Encounter (HOSPITAL_COMMUNITY): Payer: Self-pay | Admitting: *Deleted

## 2023-04-15 ENCOUNTER — Ambulatory Visit (HOSPITAL_COMMUNITY)
Admission: EM | Admit: 2023-04-15 | Discharge: 2023-04-15 | Disposition: A | Discharge status: Home / Self Care | Payer: Commercial Managed Care - PPO | Attending: Physician Assistant | Admitting: Physician Assistant

## 2023-04-15 DIAGNOSIS — J4521 Mild intermittent asthma with (acute) exacerbation: Secondary | ICD-10-CM

## 2023-04-15 DIAGNOSIS — J09X2 Influenza due to identified novel influenza A virus with other respiratory manifestations: Secondary | ICD-10-CM | POA: Diagnosis not present

## 2023-04-15 LAB — POC COVID19/FLU A&B COMBO
Covid Antigen, POC: NEGATIVE
Influenza A Antigen, POC: POSITIVE — AB
Influenza B Antigen, POC: NEGATIVE

## 2023-04-15 MED ORDER — PROMETHAZINE-DM 6.25-15 MG/5ML PO SYRP: 5.0000 mL | ORAL_SOLUTION | Freq: Four times a day (QID) | ORAL | 0 refills | Status: DC | PRN

## 2023-04-15 MED ORDER — PREDNISONE 20 MG PO TABS
40.0000 mg | ORAL_TABLET | Freq: Every day | ORAL | 0 refills | Status: AC
Start: 2023-04-15 — End: 2023-04-20

## 2023-04-15 MED ORDER — IBUPROFEN 800 MG PO TABS
ORAL_TABLET | ORAL | Status: AC
  Filled 2023-04-15: qty 1

## 2023-04-15 MED ORDER — IBUPROFEN 800 MG PO TABS
800.0000 mg | ORAL_TABLET | Freq: Once | ORAL | Status: AC
  Administered 2023-04-15: 800 mg via ORAL

## 2023-04-15 NOTE — ED Triage Notes (Signed)
Pt complains of fever, body aches, congestion, cough since Saturday. She has been taking tylenol and Nyquil without relief. She does have some inhalers but does not think they have helped.   Last dose of tylenol was 5:30pm

## 2023-04-15 NOTE — Discharge Instructions (Addendum)
Use inhaler as needed Take prednisone as prescribed Continue with Tylenol or Ibuprofen as needed for body aches and fever Drink plenty of fluids, rest.
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
## 2023-04-18 ENCOUNTER — Ambulatory Visit: Payer: Commercial Managed Care - PPO | Admitting: Podiatry

## 2023-05-06 ENCOUNTER — Ambulatory Visit
Admission: RE | Admit: 2023-05-06 | Discharge: 2023-05-06 | Disposition: A | Discharge status: Home / Self Care | Payer: Commercial Managed Care - PPO | Source: Ambulatory Visit | Attending: Podiatry

## 2023-05-06 DIAGNOSIS — R2241 Localized swelling, mass and lump, right lower limb: Secondary | ICD-10-CM

## 2023-05-06 DIAGNOSIS — S96911A Strain of unspecified muscle and tendon at ankle and foot level, right foot, initial encounter: Secondary | ICD-10-CM

## 2023-05-07 ENCOUNTER — Encounter: Payer: Self-pay | Admitting: Podiatry

## 2023-05-07 ENCOUNTER — Ambulatory Visit (INDEPENDENT_AMBULATORY_CARE_PROVIDER_SITE_OTHER): Payer: Commercial Managed Care - PPO | Admitting: Podiatry

## 2023-05-07 DIAGNOSIS — M722 Plantar fascial fibromatosis: Secondary | ICD-10-CM

## 2023-05-07 DIAGNOSIS — R2241 Localized swelling, mass and lump, right lower limb: Secondary | ICD-10-CM

## 2023-05-07 MED ORDER — TRIAMCINOLONE ACETONIDE 10 MG/ML IJ SUSP
10.0000 mg | Freq: Once | INTRAMUSCULAR | Status: AC
Start: 2023-05-07 — End: 2023-05-07
  Administered 2023-05-07: 10 mg via INTRA_ARTICULAR

## 2023-05-07 NOTE — Progress Notes (Signed)
Subjective:   Patient ID: Jacqueline Woodard, female   DOB: 38 y.o.   MRN: [id number]   HPI Patient presents stating that she has had a lot of pain in her mid arch right and that it has been inflamed and sore and she did have quite a bit of additional swelling in her right foot into the ankle.  She has had a negative Doppler of the right leg ruling out recurrent blood clot and has just had an MRI done in the last 24 hours   ROS      Objective:  Physical Exam  Patient does have forefoot midfoot and ankle inflammation right negative Denna Haggard' sign was noted.  Most the pain seems to be in the right arch with inflammation in this area and there is no pitting noted     Assessment:  This appears to be a combine problem of inflammation of the fascia right with swelling with patient also due to see her internal medicine doctor     Plan:  H&P reviewed everything today I went ahead I did do sterile prep and injected the mid arch 3 mg Kenalog 5 mg Xylocaine to try to reduce the inflammation as she has already had her MRI done.  I then applied an Radio broadcast assistant Ace wrap and patient will see Dr. Ardelle Anton back after the results of MRI are evaluated

## 2023-05-20 ENCOUNTER — Encounter: Payer: Self-pay | Admitting: Podiatry

## 2023-05-22 ENCOUNTER — Ambulatory Visit: Payer: Commercial Managed Care - PPO | Admitting: Family Medicine

## 2023-05-22 VITALS — BP 128/92 | HR 100 | Ht 61.0 in | Wt 213.8 lb

## 2023-05-22 DIAGNOSIS — G43709 Chronic migraine without aura, not intractable, without status migrainosus: Secondary | ICD-10-CM | POA: Diagnosis not present

## 2023-05-22 DIAGNOSIS — R030 Elevated blood-pressure reading, without diagnosis of hypertension: Secondary | ICD-10-CM | POA: Diagnosis not present

## 2023-05-22 DIAGNOSIS — G4733 Obstructive sleep apnea (adult) (pediatric): Secondary | ICD-10-CM | POA: Diagnosis not present

## 2023-05-22 DIAGNOSIS — Z72820 Sleep deprivation: Secondary | ICD-10-CM

## 2023-05-22 DIAGNOSIS — E669 Obesity, unspecified: Secondary | ICD-10-CM

## 2023-05-22 MED ORDER — TOPIRAMATE 50 MG PO TABS
50.0000 mg | ORAL_TABLET | Freq: Two times a day (BID) | ORAL | 3 refills | Status: AC
Start: 2023-05-22 — End: ?

## 2023-05-22 MED ORDER — AMLODIPINE BESYLATE-VALSARTAN 5-160 MG PO TABS
1.0000 | ORAL_TABLET | Freq: Every day | ORAL | 3 refills | Status: DC
Start: 2023-05-22 — End: 2023-07-30

## 2023-05-22 NOTE — Patient Instructions (Signed)
 Good to see you today - Thank you for coming in  Things we discussed today:  1) For your blood pressure, we will restart your amlodipine-valsartan once a day  2) For your sleep issues, you may have sleep apnea.  -I am sending a referral for you to see a pulmonologist for a sleep study.  The sleep study can help determine if you need a CPAP machine. -Start using Flonase spray in each nostril once a day  3) To help with weight loss and migraines, I am increasing your topamax - Start taking topamax 50mg  twice a day  Come back to see me in 1-2 months to follow-up your weight and blood pressure.

## 2023-05-22 NOTE — Progress Notes (Signed)
    SUBJECTIVE:   CHIEF COMPLAINT / HPI:   Doristine Church is a 38yo F w/ hx of HTN, migraines that presents for sleep concerns.  HTN - Needs refill on medication, has been off  Sleep - Goes to bed around 10-11pm - Struggles falling asleep - Wakes up at 6am. - Reports sometimes waking up in middle of night with a bad migraine - Will take ibuprofen and tylenol - Reports that her partner says she snores really loud and sometimes stops breathing when sleeping - Reports feeling fatigued throughout the day  OBJECTIVE:   BP (!) 128/92   Pulse 100   Ht 5\' 1"  (1.549 m)   Wt 213 lb 12.8 oz (97 kg)   SpO2 100%   BMI 40.40 kg/m   General: Alert, pleasant well-appearing woman. NAD. HEENT: NCAT. MMM. CV: RRR, no murmurs.  Resp: CTAB, no wheezing or crackles. Normal WOB on RA.  Abm: Soft, nontender, nondistended. BS present. Ext: Moves all ext spontaneously Skin: Warm, well perfused   ASSESSMENT/PLAN:   Assessment & Plan OSA (obstructive sleep apnea) STOP-BANG warrants further evaluation for OSA.  Will also start Flonase for potential adenopathy or allergies contributing.  Provided handout with information about sleep apnea. -Referral to pulmonology for sleep study -Start Flonase daily Elevated blood pressure reading Elevated in setting of not taking medications.  Previously well-controlled on amlodipine/valsartan 5/160 daily.  Will provide refill for this. Obesity, unspecified class, unspecified obesity type, unspecified whether serious comorbidity present Counseled that weight loss can help with sleep apnea symptoms.  Patient is currently on topiramate for migraine prophylaxis. -Increase Topamax to 50 mg twice daily for weight loss and migraine prophylaxis benefit. Chronic migraine without aura without status migrainosus, not intractable Uncontrolled, likely secondary to poor sleep and potential OSA.  Will increase Topamax as above    Lincoln Brigham, MD [facility]

## 2023-05-23 NOTE — Assessment & Plan Note (Signed)
 Uncontrolled, likely secondary to poor sleep and potential OSA.  Will increase Topamax as above

## 2023-05-30 ENCOUNTER — Other Ambulatory Visit: Payer: Self-pay | Admitting: Podiatry

## 2023-05-30 DIAGNOSIS — M722 Plantar fascial fibromatosis: Secondary | ICD-10-CM

## 2023-06-05 ENCOUNTER — Encounter: Payer: Self-pay | Admitting: Family Medicine
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
## 2023-06-06 ENCOUNTER — Other Ambulatory Visit: Payer: Self-pay

## 2023-06-06 ENCOUNTER — Ambulatory Visit: Attending: Podiatry | Admitting: Physical Therapy

## 2023-06-06 ENCOUNTER — Encounter: Payer: Self-pay | Admitting: Physical Therapy

## 2023-06-06 DIAGNOSIS — M6281 Muscle weakness (generalized): Secondary | ICD-10-CM | POA: Insufficient documentation

## 2023-06-06 DIAGNOSIS — M79671 Pain in right foot: Secondary | ICD-10-CM | POA: Insufficient documentation

## 2023-06-06 DIAGNOSIS — R2689 Other abnormalities of gait and mobility: Secondary | ICD-10-CM | POA: Insufficient documentation

## 2023-06-06 DIAGNOSIS — M722 Plantar fascial fibromatosis: Secondary | ICD-10-CM | POA: Insufficient documentation

## 2023-06-06 NOTE — Patient Instructions (Signed)
 Access Code: [email or web address] URL: [email or web address] Date: 06/06/2023 Prepared by: Rosana Hoes  Exercises - Long Sitting Plantar Fascia Stretch with Towel  - 2 x daily - 3 reps - 30 seconds hold - Long Sitting Ankle Plantar Flexion with Resistance  - 2 x daily - 2 sets - 10 reps - Ankle Inversion Eversion Towel Slide  - 2 x daily - 2 sets - 10 reps - Seated Toe Towel Scrunches  - 2 x daily - 2 sets - 10 reps - Seated Heel Toe Raises  - 2 x daily - 2 sets - 10 reps

## 2023-06-09 ENCOUNTER — Ambulatory Visit: Admitting: Physical Therapy

## 2023-06-09 ENCOUNTER — Encounter: Payer: Self-pay | Admitting: Physical Therapy

## 2023-06-09 DIAGNOSIS — M79671 Pain in right foot: Secondary | ICD-10-CM | POA: Diagnosis not present

## 2023-06-09 DIAGNOSIS — R2689 Other abnormalities of gait and mobility: Secondary | ICD-10-CM

## 2023-06-09 DIAGNOSIS — M6281 Muscle weakness (generalized): Secondary | ICD-10-CM
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
## 2023-06-13 ENCOUNTER — Ambulatory Visit

## 2023-06-18 ENCOUNTER — Ambulatory Visit: Attending: Podiatry

## 2023-06-18 DIAGNOSIS — M6281 Muscle weakness (generalized): Secondary | ICD-10-CM | POA: Diagnosis present

## 2023-06-18 DIAGNOSIS — R2689 Other abnormalities of gait and mobility: Secondary | ICD-10-CM | POA: Insufficient documentation

## 2023-06-18 DIAGNOSIS — M79671 Pain in right foot: Secondary | ICD-10-CM | POA: Diagnosis present
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
## 2023-06-20 ENCOUNTER — Ambulatory Visit: Admitting: Physical Therapy

## 2023-06-20 ENCOUNTER — Encounter: Payer: Self-pay | Admitting: Physical Therapy

## 2023-06-20 DIAGNOSIS — M6281 Muscle weakness (generalized): Secondary | ICD-10-CM

## 2023-06-20 DIAGNOSIS — R2689 Other abnormalities of gait and mobility: Secondary | ICD-10-CM

## 2023-06-20 DIAGNOSIS — M79671 Pain in right foot: Secondary | ICD-10-CM
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
## 2023-06-23 ENCOUNTER — Ambulatory Visit: Admitting: Physical Therapy

## 2023-06-23 ENCOUNTER — Encounter: Payer: Self-pay | Admitting: Physical Therapy

## 2023-06-23 DIAGNOSIS — M79671 Pain in right foot: Secondary | ICD-10-CM | POA: Diagnosis not present

## 2023-06-23 DIAGNOSIS — M6281 Muscle weakness (generalized): Secondary | ICD-10-CM
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
## 2023-06-26 ENCOUNTER — Ambulatory Visit

## 2023-06-26 ENCOUNTER — Telehealth: Payer: Self-pay

## 2023-06-26 NOTE — Telephone Encounter (Signed)
 PT called and left voicemail regarding missed appointment for 06/26/23. Left reminder of attendance policy and of next scheduled appointment.   Jacqueline Woodard   06/26/23 8:38 AM

## 2023-06-30 ENCOUNTER — Ambulatory Visit: Admitting: Physical Therapy

## 2023-06-30 ENCOUNTER — Encounter: Payer: Self-pay | Admitting: Physical Therapy

## 2023-06-30 DIAGNOSIS — M6281 Muscle weakness (generalized): Secondary | ICD-10-CM

## 2023-06-30 DIAGNOSIS — M79671 Pain in right foot: Secondary | ICD-10-CM
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
## 2023-07-02 ENCOUNTER — Ambulatory Visit: Admitting: Physical Therapy

## 2023-07-02 ENCOUNTER — Encounter: Payer: Self-pay | Admitting: Physical Therapy

## 2023-07-02 DIAGNOSIS — M79671 Pain in right foot: Secondary | ICD-10-CM

## 2023-07-02 DIAGNOSIS — M6281 Muscle weakness (generalized): Secondary | ICD-10-CM
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
## 2023-07-07 ENCOUNTER — Ambulatory Visit

## 2023-07-07 DIAGNOSIS — M79671 Pain in right foot: Secondary | ICD-10-CM | POA: Diagnosis not present

## 2023-07-07 DIAGNOSIS — M6281 Muscle weakness (generalized): Secondary | ICD-10-CM
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
## 2023-07-09 ENCOUNTER — Ambulatory Visit (INDEPENDENT_AMBULATORY_CARE_PROVIDER_SITE_OTHER): Admitting: Student

## 2023-07-09 VITALS — BP 114/86 | HR 91 | Ht 61.0 in | Wt 221.0 lb

## 2023-07-09 DIAGNOSIS — R7303 Prediabetes: Secondary | ICD-10-CM

## 2023-07-09 DIAGNOSIS — R600 Localized edema: Secondary | ICD-10-CM

## 2023-07-09 LAB — POCT GLYCOSYLATED HEMOGLOBIN (HGB A1C): Hemoglobin A1C: 6 % — AB (ref 4.0–5.6)

## 2023-07-09 LAB — SPECIMEN STATUS REPORT

## 2023-07-09 MED ORDER — FUROSEMIDE 20 MG PO TABS
ORAL_TABLET | ORAL | 0 refills | Status: DC
Start: 2023-07-09 — End: 2023-08-13

## 2023-07-09 NOTE — Patient Instructions (Addendum)
 It was great to see you! Thank you for allowing me to participate in your care!   I recommend that you always bring your medications to each appointment as this makes it easy to ensure we are on the correct medications and helps us  not miss when refills are needed.  Our plans for today:  - Please take 20 mg of lasix  daily for 7 days. Then take it as needed for leg swelling that does not respond to compression socks. - We are checking some labs today, I will call you if they are abnormal will send you a MyChart message or a letter if they are normal.  If you do not hear about your labs in the next 2 weeks please let us  know.  Take care and seek immediate care sooner if you develop any concerns. Please remember to show up 15 minutes before your scheduled appointment time!  Lavada Porteous, DO [facility]

## 2023-07-09 NOTE — Progress Notes (Signed)
    SUBJECTIVE:   CHIEF COMPLAINT / HPI:   Bilateral lower leg edema, right worse than left Longstanding history of bilateral lower extremity edema, has seen [facility] in 2013 diagnosed with lymphedema-had vein mapping, it was determined that vein closure would not be beneficial at that time.  She had a foot fracture in December 2024.  Currently following podiatry for this.  Swelling is unlikely related to the foot fracture.  She has had no reduction in her physical activity.  Over the past few days patient's had difficulty with placing stockings over legs due to swelling and discomfort.  She is still raising her legs.  She is currently taking her amlodipine -valsartan  daily, with good BP control.  She has not taken her diuretic.  Still  Denies chest pain, palpitations, shortness of breath, exertional dyspnea, cough.   OBJECTIVE:   BP 114/86   Pulse 91   Ht 5\' 1"  (1.549 m)   Wt 221 lb (100.2 kg)   SpO2 97%   BMI 41.76 kg/m    General: NAD, pleasant, Cardio: RRR, no MRG. Cap Refill <2s. Respiratory: CTAB, normal wob on RA Lower extremities: Bilateral +1 pitting edema, right edema worse than left.  Not tender to touch, no warmth, no weeping. Skin: Warm and dry   ASSESSMENT/PLAN:   Assessment & Plan Bilateral leg edema Chronic bilateral lymphedema.  Right worse than left, significantly exacerbated over the last few days.  Prior DVT ultrasound negative in January. Wells DVT score 1, moderate risk. - TSH, CMP - D-dimer STAT - Lasix  20 mg daily for 7 days, daily as needed after - Consider discontinuation of amlodipine  - Consider referral to [facility] - Follow-up in 2 weeks Prediabetes A1c today  Jacqueline Porteous, DO [facility]

## 2023-07-09 NOTE — Assessment & Plan Note (Signed)
 Chronic bilateral lymphedema.  Right worse than left, significantly exacerbated over the last few days.  Prior DVT ultrasound negative in January. Wells DVT score 1, moderate risk. - TSH, CMP - D-dimer STAT - Lasix  20 mg daily for 7 days, daily as needed after - Consider discontinuation of amlodipine  - Consider referral to [facility] - Follow-up in 2 weeks

## 2023-07-10 LAB — COMPREHENSIVE METABOLIC PANEL WITH GFR
ALT: 8 IU/L (ref 0–32)
AST: 16 IU/L (ref 0–40)
Albumin: 4 g/dL (ref 3.9–4.9)
Alkaline Phosphatase: 139 IU/L — ABNORMAL HIGH (ref 44–121)
BUN/Creatinine Ratio: 9 (ref 9–23)
BUN: 8 mg/dL (ref 6–20)
Bilirubin Total: 0.2 mg/dL (ref 0.0–1.2)
CO2: 22 mmol/L (ref 20–29)
Calcium: 8.3 mg/dL — ABNORMAL LOW (ref 8.7–10.2)
Chloride: 106 mmol/L (ref 96–106)
Creatinine, Ser: 0.87 mg/dL (ref 0.57–1.00)
Globulin, Total: 2.5 g/dL (ref 1.5–4.5)
Glucose: 92 mg/dL (ref 70–99)
Potassium: 3.9 mmol/L (ref 3.5–5.2)
Sodium: 141 mmol/L (ref 134–144)
Total Protein: 6.5 g/dL (ref 6.0–8.5)
eGFR: 87 mL/min/{1.73_m2} (ref >59)

## 2023-07-10 LAB — D-DIMER, QUANTITATIVE

## 2023-07-10 LAB — TSH RFX ON ABNORMAL TO FREE T4: TSH: 2.36 u[IU]/mL (ref 0.450–4.500)

## 2023-07-11 ENCOUNTER — Telehealth: Payer: Self-pay

## 2023-07-11 ENCOUNTER — Ambulatory Visit

## 2023-07-11 ENCOUNTER — Encounter: Payer: Self-pay | Admitting: Student

## 2023-07-11 ENCOUNTER — Other Ambulatory Visit: Payer: Self-pay | Admitting: Student

## 2023-07-11 DIAGNOSIS — R600 Localized edema: Secondary | ICD-10-CM

## 2023-07-11 NOTE — Telephone Encounter (Signed)
 Spoke with patient. Made appt for 4/28 at 8:45. Linnie Riches, CMA

## 2023-07-14 ENCOUNTER — Encounter: Payer: Self-pay | Admitting: Family Medicine

## 2023-07-14 ENCOUNTER — Ambulatory Visit

## 2023-07-14 ENCOUNTER — Other Ambulatory Visit

## 2023-07-14 ENCOUNTER — Other Ambulatory Visit: Payer: Self-pay | Admitting: Family Medicine

## 2023-07-14 ENCOUNTER — Ambulatory Visit (HOSPITAL_COMMUNITY): Admission: RE | Admit: 2023-07-14 | Source: Ambulatory Visit

## 2023-07-14 DIAGNOSIS — R7989 Other specified abnormal findings of blood chemistry: Secondary | ICD-10-CM

## 2023-07-14 DIAGNOSIS — M7989 Other specified soft tissue disorders: Secondary | ICD-10-CM

## 2023-07-14 DIAGNOSIS — R600 Localized edema: Secondary | ICD-10-CM

## 2023-07-14 LAB — D-DIMER, QUANTITATIVE: D-DIMER: 0.65 mg{FEU}/L — ABNORMAL HIGH (ref 0.00–0.49)

## 2023-07-15 ENCOUNTER — Other Ambulatory Visit (HOSPITAL_COMMUNITY): Payer: Self-pay | Admitting: Family Medicine

## 2023-07-15 DIAGNOSIS — R609 Edema, unspecified: Secondary | ICD-10-CM

## 2023-07-16 ENCOUNTER — Encounter: Payer: Self-pay | Admitting: Family Medicine

## 2023-07-16 ENCOUNTER — Telehealth: Payer: Self-pay

## 2023-07-16 ENCOUNTER — Ambulatory Visit (HOSPITAL_COMMUNITY)
Admission: RE | Admit: 2023-07-16 | Discharge: 2023-07-16 | Disposition: A | Discharge status: Home / Self Care | Source: Ambulatory Visit | Attending: Surgery | Admitting: Surgery

## 2023-07-16 ENCOUNTER — Other Ambulatory Visit: Payer: Self-pay | Admitting: Family Medicine

## 2023-07-16 DIAGNOSIS — R609 Edema, unspecified: Secondary | ICD-10-CM | POA: Insufficient documentation

## 2023-07-16 DIAGNOSIS — R600 Localized edema: Secondary | ICD-10-CM

## 2023-07-16 NOTE — Telephone Encounter (Signed)
 Spoke with Dr. Andree Kayser.  Patients US  negative for DVT. Patient can stop taking Eliquis.   I called patient and advised on negative results and the need to stop Eliquis.   Patient reports she has been taking Lasix  as instructed, however does not see improvement. She is wondering if she should continue to take this.   Patient has FU scheduled with Telford Feather on 5/12.  Will forward to provider who saw patient for concern.

## 2023-07-18 ENCOUNTER — Ambulatory Visit: Attending: Podiatry | Admitting: Physical Therapy

## 2023-07-18 ENCOUNTER — Encounter: Payer: Self-pay | Admitting: Physical Therapy

## 2023-07-18 ENCOUNTER — Encounter: Payer: Self-pay | Admitting: Podiatry

## 2023-07-18 DIAGNOSIS — R2689 Other abnormalities of gait and mobility: Secondary | ICD-10-CM | POA: Diagnosis present

## 2023-07-18 DIAGNOSIS — M79671 Pain in right foot: Secondary | ICD-10-CM | POA: Diagnosis present

## 2023-07-18 DIAGNOSIS — M6281 Muscle weakness (generalized): Secondary | ICD-10-CM | POA: Insufficient documentation
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
## 2023-07-21 ENCOUNTER — Ambulatory Visit

## 2023-07-21 DIAGNOSIS — M79671 Pain in right foot: Secondary | ICD-10-CM | POA: Diagnosis not present

## 2023-07-21 DIAGNOSIS — R2689 Other abnormalities of gait and mobility: Secondary | ICD-10-CM

## 2023-07-21 DIAGNOSIS — M6281 Muscle weakness (generalized): Secondary | ICD-10-CM
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
## 2023-07-22 ENCOUNTER — Ambulatory Visit (INDEPENDENT_AMBULATORY_CARE_PROVIDER_SITE_OTHER)

## 2023-07-22 ENCOUNTER — Encounter: Payer: Self-pay | Admitting: Podiatry

## 2023-07-22 ENCOUNTER — Ambulatory Visit (INDEPENDENT_AMBULATORY_CARE_PROVIDER_SITE_OTHER): Admitting: Podiatry

## 2023-07-22 DIAGNOSIS — M778 Other enthesopathies, not elsewhere classified: Secondary | ICD-10-CM

## 2023-07-22 DIAGNOSIS — M722 Plantar fascial fibromatosis: Secondary | ICD-10-CM | POA: Diagnosis not present

## 2023-07-22 DIAGNOSIS — M7751 Other enthesopathy of right foot: Secondary | ICD-10-CM

## 2023-07-22 DIAGNOSIS — R2241 Localized swelling, mass and lump, right lower limb: Secondary | ICD-10-CM | POA: Diagnosis not present

## 2023-07-22 MED ORDER — IBUPROFEN 800 MG PO TABS: 800.0000 mg | ORAL_TABLET | Freq: Three times a day (TID) | ORAL | 0 refills | Status: AC

## 2023-07-22 NOTE — Patient Instructions (Signed)
Unna Boot Care An Unna boot is a type of bandage (dressing) for the foot and leg. The dressing is a wrap made of gauze that is soaked with a medicine called zinc oxide. The gauze may also include other lotions and medicines that help in wound healing, such as calamine. An Unna boot may be used to: Treat open sores (venous ulcers) or graft sites on the foot, heel, or leg. Help with swelling from conditions that affect the veins or lymphatic system (lymphedema). Treat skin conditions such as inflammation caused by poor blood flow (stasis dermatitis). Heal wounds on parts of the body below the hips (lower extremities). The dressing is applied by a health care provider. The gauze is wrapped around your lower extremity in several layers that overlap. These layers usually start at the toes and go up to the knee. A dry outer wrap goes over the medicated wrap for support and pressure (compression). Before applying the Unna boot, your health care provider will clean your leg and foot and may apply an antibiotic. You may be asked to raise (elevate) your leg for a while to reduce swelling before the boot is put on. The boot will dry and harden after it is applied. It may need to be changed or replaced once or twice a week. Follow these instructions at home: Boot care Wear the Unna boot as told by your health care provider. You may need to wear a slipper or shoe over the boot that is one or two sizes larger than normal. Do not stick anything inside the boot to scratch your skin. Doing that increases your risk of infection. Check the skin around the boot every day. Tell your health care provider about any concerns. Keep your Unna boot clean and dry. Check the area around the boot every day for signs of infection. Check for: Redness, swelling, or pain in your foot or toes. Fluid or blood coming from the boot. Warmth. Pus or a bad smell. A rash, itching, or red, swollen areas of skin (hives). Remove the boot  and call your health care provider if you have signs of poor blood flow, such as: Your toes tingle or become numb. Your toes turn cold or turn blue or pale. Your toes are more swollen or painful. You cannot move your toes. Bathing Do not take baths, swim, or use a hot tub until your health care provider approves. Ask your health care provider if you may take showers. You may only be allowed to take sponge baths. If your health care provider says that you can take a bath or shower: Do not let the Unna boot get wet. Cover the boot with a watertight covering when you shower. Keep your leg with the boot out of the bathtub when you take a bath. Activity Rest as told by your health care provider. Do not sit for a long time without moving. Get up to take short walks every 1-2 hours. This will improve blood flow and breathing. Ask for help if you feel weak or unsteady. You may walk with the boot once it has dried. Ask your health care provider how much walking is safe for you. General instructions Take over-the-counter and prescription medicines only as told by your health care provider. Keep your leg elevated above the level of your heart while you are sitting or lying down. This will decrease swelling. Do not sit with your knee bent for long periods of time. Do not use any products that contain nicotine   or tobacco. These products include cigarettes, chewing tobacco, and vaping devices, such as e-cigarettes. If you need help quitting, ask your health care provider. Keep all follow-up visits. Your health care provider will change your boot once or twice a week until it is no longer needed. Contact a health care provider if: Your skin feels itchy inside the boot. You feel burning or have a rash or hives in the boot area. You have a fever or chills. You have any signs of infection. You have more numbness or pain in your foot or toes. The skin on your foot or toes changes colors. This may include the  skin turning blue or pale or having patchy areas with spots. Your boot has been damaged or feels like it no longer fits like it should. This information is not intended to replace advice given to you by your health care provider. Make sure you discuss any questions you have with your health care provider. Document Revised: 07/30/2021 Document Reviewed: 07/30/2021 Elsevier Patient Education  2024 [employer]  

## 2023-07-23 ENCOUNTER — Encounter: Payer: Self-pay | Admitting: Nurse Practitioner

## 2023-07-23 ENCOUNTER — Ambulatory Visit: Admitting: Nurse Practitioner

## 2023-07-23 VITALS — BP 114/82 | HR 90 | Ht 61.0 in | Wt 222.0 lb

## 2023-07-23 DIAGNOSIS — R0683 Snoring: Secondary | ICD-10-CM

## 2023-07-23 DIAGNOSIS — G4719 Other hypersomnia: Secondary | ICD-10-CM | POA: Diagnosis not present

## 2023-07-23 DIAGNOSIS — E66813 Obesity, class 3: Secondary | ICD-10-CM

## 2023-07-23 DIAGNOSIS — Z6841 Body Mass Index (BMI) 40.0 and over, adult: Secondary | ICD-10-CM

## 2023-07-23 DIAGNOSIS — G4700 Insomnia, unspecified: Secondary | ICD-10-CM

## 2023-07-23 DIAGNOSIS — R0681 Apnea, not elsewhere classified: Secondary | ICD-10-CM

## 2023-07-23 NOTE — Progress Notes (Signed)
 @Patient  ID: Jacqueline Woodard, female    DOB: 1985/11/09, 38 y.o.   MRN: [id number]  Chief Complaint  Patient presents with   Consult    Referring provider: Candee Cha, MD  HPI: 38 year old female, never smoker referred for sleep consult.  Past medical history significant for hypertension, migraine, obesity, depression, anxiety.  TEST/EVENTS:   07/23/2023: Today - sleep consult Discussed the use of AI scribe software for clinical note transcription with the patient, who gave verbal consent to proceed.  History of Present Illness   Jacqueline Woodard is a 38 year old female who presents for a sleep consult due to snoring, waking up with migraines, and suspected sleep apnea.  She experiences snoring and wakes up with migraines. Others have informed her that she appears to stop breathing at times during sleep. She feels tired during the day but denies any episodes of drowsy driving or falling asleep while driving. No sleep paralysis or parasomnias present.  She is currently taking trazodone for sleep, which sometimes helps her fall asleep, but she still wakes up a couple of times during the night. Doesn't seem to be as effective. Her weight has fluctuated, with a recent increase of about fifteen to twenty pounds.  She goes to bed between 10 PM and midnight.  Typically falls asleep within 30 to 45 minutes.  Wakes at least 2-3 times a night.  Usually gets up around 6 to 8 AM.  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 She consumes alcohol socially, about once or twice a month, and her caffeine intake is minimal, approximately eight ounces once or twice a week.     Epworth 11  Allergies  Allergen Reactions   Shellfish Allergy Anaphylaxis and Swelling    ALL SHELLFISH---SWELLING OF FACE AND THROAT   Vancomycin  Other (See Comments)    Kidney failure    Immunization History  Administered Date(s) Administered   Influenza Inj Mdck  Quad Pf 01/01/2017   Influenza,inj,Quad PF,6+ Mos 12/06/2013, 01/28/2018   Influenza-Unspecified 12/31/2016   PFIZER(Purple Top)SARS-COV-2 Vaccination 12/19/2019, 01/09/2020   PPD Test 07/19/2015   Tdap 05/18/2021    Past Medical History:  Diagnosis Date   Chronic migraine    Endometrial polyp    GERD (gastroesophageal reflux disease)    Hereditary lymphedema of legs    chronic --- right > left   History of acute renal failure    in setting cellulitis left lower leg 06/ 2008 and right lower leg cellulitis with sepsis   History of borderline diabetes mellitus    08-29-2017 PER PT DX 2016 was checked again last year no longer pre-diabetic   History of cellulitis    06/ 2008  left lower leg cellulitis:  12-05-2013  and 06-04-2014-- right lower leg cellulitis w/ sepsis   History of Clostridium difficile infection 08/2006   History of gastric ulcer 2015   History of ovarian cyst    History of sepsis    w/ right lower leg cellulitis 12-05-2013 and 06-04-2014   Microcytic anemia    Mild asthma    INHALER PRN-- LAST USED 2014   Uterine fibroid    Wears glasses     Tobacco History: Social History   Tobacco Use  Smoking Status Never  Smokeless Tobacco Never   Counseling given: Not Answered   Outpatient Medications Prior to Visit  Medication Sig Dispense Refill   albuterol  (VENTOLIN  HFA) 108 (90 Base) MCG/ACT inhaler TAKE 2 PUFFS BY  MOUTH EVERY 6 HOURS AS NEEDED FOR WHEEZE OR SHORTNESS OF BREATH 6.7 each 2   amLODipine -valsartan  (EXFORGE ) 5-160 MG tablet Take 1 tablet by mouth daily. 90 tablet 3   amphetamine-dextroamphetamine (ADDERALL XR) 20 MG 24 hr capsule Take 20 mg by mouth daily.     budesonide -formoterol  (SYMBICORT ) 80-4.5 MCG/ACT inhaler INHALE 2 PUFFS INTO THE LUNGS TWICE A DAY 30.6 each 1   DYANAVEL XR 10 MG CHER Take by mouth.     EPINEPHrine  0.3 mg/0.3 mL IJ SOAJ injection Inject 0.3 mg into the muscle as needed for anaphylaxis. 1 each 1   fluticasone  (FLONASE ) 50  MCG/ACT nasal spray SPRAY 2 SPRAYS INTO EACH NOSTRIL EVERY DAY 48 mL 2   furosemide  (LASIX ) 20 MG tablet Take 1 tablet (20 mg total) by mouth daily for 7 days, THEN 1 tablet (20 mg total) daily as needed for up to 14 days (for leg swelling not responding to compression). 21 tablet 0   ibuprofen  (ADVIL ) 800 MG tablet Take 1 tablet (800 mg total) by mouth 3 (three) times daily. 21 tablet 0   lamoTRIgine (LAMICTAL) 200 MG tablet Take 200 mg by mouth at bedtime.     loratadine  (CLARITIN  REDITABS) 10 MG dissolvable tablet Take 1 tablet (10 mg total) by mouth daily. As needed for allergy symptoms 31 tablet 12   LORazepam (ATIVAN) 0.5 MG tablet Take by mouth.     olopatadine  (PATANOL) 0.1 % ophthalmic solution Place 1 drop into both eyes 2 (two) times daily. 5 mL 12   ondansetron  (ZOFRAN ) 4 MG tablet Take 1 tablet (4 mg total) by mouth every 8 (eight) hours as needed for nausea or vomiting. 20 tablet 1   SUMAtriptan  (IMITREX ) 50 MG tablet Take 1 tablet (50 mg total) by mouth every 2 (two) hours as needed for migraine. May repeat in 2 hours if headache persists or recurs. 10 tablet 1   topiramate  (TOPAMAX ) 25 MG tablet Take 1 tablet (25 mg total) by mouth 2 (two) times daily. 180 tablet 1   topiramate  (TOPAMAX ) 50 MG tablet Take 1 tablet (50 mg total) by mouth 2 (two) times daily. 180 tablet 3   traZODone (DESYREL) 50 MG tablet Take 100 mg by mouth at bedtime as needed.     venlafaxine  XR (EFFEXOR -XR) 75 MG 24 hr capsule Take 1 capsule (75 mg total) by mouth daily with breakfast. 90 capsule 3   No facility-administered medications prior to visit.     Review of Systems:   Constitutional: No night sweats, fevers, chills, or lassitude. +weight gain, fatigue  HEENT: No difficulty swallowing, tooth/dental problems, or sore throat. No sneezing, itching, ear ache, nasal congestion, or post nasal drip. +headaches CV:  No chest pain, orthopnea, PND, swelling in lower extremities, anasarca, dizziness,  palpitations, syncope Resp: +snoring, witnessed apneas; baseline shortness of breath with exertion. No excess mucus or change in color of mucus. No productive or non-productive. No hemoptysis. No wheezing.   GI:  No heartburn, indigestion GU: No nocturia  Skin: No rash, lesions, ulcerations MSK:  No joint pain or swelling.  Neuro: No dizziness or lightheadedness.  Psych: No depression or anxiety. Mood stable. +sleep disturbance    Physical Exam:  BP 114/82 (BP Location: Left Arm, Patient Position: Sitting, Cuff Size: Large)   Pulse 90   Ht 5\' 1"  (1.549 m)   Wt 222 lb (100.7 kg)   SpO2 96%   BMI 41.95 kg/m   GEN: Pleasant, interactive, well-appearing; obese; in no acute distress  HEENT:  Normocephalic and atraumatic. PERRLA. Sclera white. Nasal turbinates pink, moist and patent bilaterally. No rhinorrhea present. Oropharynx pink and moist, without exudate or edema. No lesions, ulcerations, or postnasal drip. Mallampati III NECK:  Supple w/ fair ROM. Thyroid  symmetrical with no goiter or nodules palpated. No lymphadenopathy.   CV: RRR, no m/r/g, no peripheral edema. Pulses intact, +2 bilaterally. No cyanosis, pallor or clubbing. PULMONARY:  Unlabored, regular breathing. Clear bilaterally A&P w/o wheezes/rales/rhonchi. No accessory muscle use.  GI: BS present and normoactive. Soft, non-tender to palpation. MSK: No erythema, warmth or tenderness.  Neuro: A/Ox3. No focal deficits noted.   Skin: Warm, no lesions or rashe Psych: Normal affect and behavior. Judgement and thought content appropriate.     Lab Results:  CBC    Component Value Date/Time   WBC 9.2 11/13/2021 2322   RBC 4.92 11/13/2021 2322   HGB 13.9 09/06/2022 1248   HGB 12.5 01/01/2021 1041   HCT 41.0 09/06/2022 1248   HCT 38.8 01/01/2021 1041   PLT 315 11/13/2021 2322   PLT 339 01/01/2021 1041   MCV 83.1 11/13/2021 2322   MCV 82 01/01/2021 1041   MCH 26.8 11/13/2021 2322   MCHC 32.3 11/13/2021 2322   RDW 13.2  11/13/2021 2322   RDW 12.9 01/01/2021 1041   LYMPHSABS 3.9 11/13/2021 2322   LYMPHSABS 3.6 (H) 08/17/2018 1132   MONOABS 0.7 11/13/2021 2322   EOSABS 0.1 11/13/2021 2322   EOSABS 0.2 08/17/2018 1132   BASOSABS 0.1 11/13/2021 2322   BASOSABS 0.0 08/17/2018 1132    BMET    Component Value Date/Time   NA 141 07/09/2023 0927   K 3.9 07/09/2023 0927   CL 106 07/09/2023 0927   CO2 22 07/09/2023 0927   GLUCOSE 92 07/09/2023 0927   GLUCOSE 77 09/06/2022 1248   BUN 8 07/09/2023 0927   CREATININE 0.87 07/09/2023 0927   CREATININE 1.21 (H) 12/16/2013 1015   CALCIUM  8.3 (L) 07/09/2023 0927   GFRNONAA >60 11/13/2021 2322   GFRNONAA 61 12/16/2013 1015   GFRAA 91 02/28/2020 0911   GFRAA 70 12/16/2013 1015    BNP No results found for: "BNP"   Imaging:  VAS US  LOWER EXTREMITY VENOUS (DVT) Result Date: 07/16/2023  Lower Venous DVT Study Patient Name:  HAZE MADAN  Date of Exam:   07/16/2023 Medical Rec #: [id number]          Accession #:    [id number] Date of Birth: [birth year]         Patient Gender: F Patient Age:   68 years Exam Location:  [facility] Procedure:      VAS US  LOWER EXTREMITY VENOUS (DVT) Referring Phys: SARA NEAL --------------------------------------------------------------------------------  Indications: Edema.  Performing Technologist: Homer Lust RVT  Examination Guidelines: A complete evaluation includes B-mode imaging, spectral Doppler, color Doppler, and power Doppler as needed of all accessible portions of each vessel. Bilateral testing is considered an integral part of a complete examination. Limited examinations for reoccurring indications may be performed as noted. The reflux portion of the exam is performed with the patient in reverse Trendelenburg.  +---------+---------------+---------+-----------+----------+--------------+ RIGHT    CompressibilityPhasicitySpontaneityPropertiesThrombus Aging  +---------+---------------+---------+-----------+----------+--------------+ CFV      Full           Yes      No                                  +---------+---------------+---------+-----------+----------+--------------+  SFJ      Full           Yes      Yes                                 +---------+---------------+---------+-----------+----------+--------------+ FV Prox  Full           Yes      Yes                                 +---------+---------------+---------+-----------+----------+--------------+ FV Mid   Full           Yes      Yes                                 +---------+---------------+---------+-----------+----------+--------------+ FV DistalFull           Yes      Yes                                 +---------+---------------+---------+-----------+----------+--------------+ PFV      Full           Yes      Yes                                 +---------+---------------+---------+-----------+----------+--------------+ POP      Full           Yes      Yes                                 +---------+---------------+---------+-----------+----------+--------------+ PTV      Full           Yes      Yes                                 +---------+---------------+---------+-----------+----------+--------------+ PERO     Full           Yes      Yes                                 +---------+---------------+---------+-----------+----------+--------------+ Gastroc  Full           Yes      Yes                                 +---------+---------------+---------+-----------+----------+--------------+ GSV      Full           Yes      Yes                                 +---------+---------------+---------+-----------+----------+--------------+ SSV      Full           Yes      Yes                                 +---------+---------------+---------+-----------+----------+--------------+    +----+---------------+---------+-----------+----------+--------------+  LEFTCompressibilityPhasicitySpontaneityPropertiesThrombus Aging +----+---------------+---------+-----------+----------+--------------+ CFV Full           Yes      Yes                                 +----+---------------+---------+-----------+----------+--------------+   Findings reported to at 8:30.  Summary: RIGHT: - There is no evidence of deep vein thrombosis in the lower extremity. - There is no evidence of superficial venous thrombosis.   *See table(s) above for measurements and observations. Electronically signed by Angela Kell MD on 07/16/2023 at 4:12:40 PM.    Final     Administration History     None           No data to display          No results found for: "NITRICOXIDE"      Assessment & Plan:   Excessive daytime sleepiness She has snoring, excessive daytime sleepiness, nocturnal apneic events, morning headaches, restless sleep. BMI 41. Epworth 11. Given this,  I am concerned she could have sleep disordered breathing with obstructive sleep apnea. She will need sleep study for further evaluation.    - discussed how weight can impact sleep and risk for sleep disordered breathing - discussed options to assist with weight loss: combination of diet modification, cardiovascular and strength training exercises   - had an extensive discussion regarding the adverse health consequences related to untreated sleep disordered breathing - specifically discussed the risks for hypertension, coronary artery disease, cardiac dysrhythmias, cerebrovascular disease, and diabetes - lifestyle modification discussed   - discussed how sleep disruption can increase risk of accidents, particularly when driving - safe driving practices were discussed  Patient Instructions  Given your symptoms, I am concerned that you may have sleep disordered breathing with sleep apnea. You will need a sleep study for further  evaluation. Someone will contact you to schedule this.   We discussed how untreated sleep apnea puts an individual at risk for cardiac arrhthymias, pulm HTN, DM, stroke and increases their risk for daytime accidents. We also briefly reviewed treatment options including weight loss, side sleeping position, oral appliance, CPAP therapy or referral to ENT for possible surgical options  Use caution when driving and pull over if you become sleepy.  Follow up in 6 weeks with Katie Cristina Mattern,NP to go over sleep study results, or sooner, if needed. Friday PM virtual clinic preferred       Insomnia Possibly due to untreated OSA. See above. Sleep hygiene reviewed.   Class 3 severe obesity with body mass index (BMI) of 40.0 to 44.9 in adult BMI 41. Healthy weight loss encouraged.    Advised if symptoms do not improve or worsen, to please contact office for sooner follow up or seek emergency care.   I spent 35 minutes of dedicated to the care of this patient on the date of this encounter to include pre-visit review of records, face-to-face time with the patient discussing conditions above, post visit ordering of testing, clinical documentation with the electronic health record, making appropriate referrals as documented, and communicating necessary findings to members of the patients care team.  Roetta Clarke, NP 07/25/2023  Pt aware and understands NP's role.

## 2023-07-23 NOTE — Patient Instructions (Signed)
 Given your symptoms, I am concerned that you may have sleep disordered breathing with sleep apnea. You will need a sleep study for further evaluation. Someone will contact you to schedule this.   We discussed how untreated sleep apnea puts an individual at risk for cardiac arrhthymias, pulm HTN, DM, stroke and increases their risk for daytime accidents. We also briefly reviewed treatment options including weight loss, side sleeping position, oral appliance, CPAP therapy or referral to ENT for possible surgical options  Use caution when driving and pull over if you become sleepy.  Follow up in 6 weeks with Jacqueline Emmanuell Kantz,NP to go over sleep study results, or sooner, if needed. Friday PM virtual clinic preferred

## 2023-07-24 NOTE — Progress Notes (Signed)
 Subjective: Chief Complaint  Patient presents with   Foot Pain    RM#11 Patient states smashed foot in car door is in a lot of pain.Previous to injury original pain started to return.   38 year old female presents the office today with above concerns.  She states that she was doing well and the heel was feeling much better and she been doing physical therapy and she has less diminished on Friday.  She states that however she did hit her foot in a car door when she started to have pain mostly.  She has been wearing the cam boot.  Does not report any open lesions or other injuries.  Regards to the swelling she does have pumps at home and she tries to wrap them as well and socks.  Objective: AAO x3, NAD DP/PT pulses palpable bilaterally, CRT less than 3 seconds Chronic edema present to the right lower extremity.  She still has some discomfort at the plantar aspect the heel but this is doing much better.  Majority tenderness is mostly at the dorsal aspect of the foot centered around all metatarsals, midfoot.  There is no erythema or warmth flexor, extensor tendons appear to be intact.  MMT 5/5. No pain with calf compression, swelling, warmth, erythema  Assessment: Chronic swelling, plantar fasciitis, capsulitis  Plan: -All treatment options discussed with the patient including all alternatives, risks, complications.  -X-rays reviewed the right foot.  Not able to appreciate any evidence of acute fracture.  Significant edema is present. -I can review the MRI results with her from bone marrow edema present previously this is also which is getting some discomfort but not able to appreciate any evidence of acute fracture on x-ray. -At this time recommend continue immobilization in cam boot.  Discussed ice, elevation as well as compression. -Hold off on physical therapy on Friday but likely start again next week.  As the physical therapy has been helping we can continue this longer as well. -Unna boot  applied today to help with the swelling, precautions were advised and when to remove this. -Patient encouraged to call the office with any questions, concerns, change in symptoms.   Charity Conch DPM

## 2023-07-25 ENCOUNTER — Encounter: Payer: Self-pay | Admitting: Nurse Practitioner

## 2023-07-25 ENCOUNTER — Ambulatory Visit

## 2023-07-25 DIAGNOSIS — G4700 Insomnia, unspecified: Secondary | ICD-10-CM | POA: Insufficient documentation

## 2023-07-25 DIAGNOSIS — G4719 Other hypersomnia: Secondary | ICD-10-CM | POA: Insufficient documentation

## 2023-07-25 NOTE — Assessment & Plan Note (Signed)
 Possibly due to untreated OSA. See above. Sleep hygiene reviewed.

## 2023-07-25 NOTE — Assessment & Plan Note (Signed)
 BMI 41. Healthy weight loss encouraged

## 2023-07-25 NOTE — Assessment & Plan Note (Signed)
 She has snoring, excessive daytime sleepiness, nocturnal apneic events, morning headaches, restless sleep. BMI 41. Epworth 11. Given this,  I am concerned she could have sleep disordered breathing with obstructive sleep apnea. She will need sleep study for further evaluation.    - discussed how weight can impact sleep and risk for sleep disordered breathing - discussed options to assist with weight loss: combination of diet modification, cardiovascular and strength training exercises   - had an extensive discussion regarding the adverse health consequences related to untreated sleep disordered breathing - specifically discussed the risks for hypertension, coronary artery disease, cardiac dysrhythmias, cerebrovascular disease, and diabetes - lifestyle modification discussed   - discussed how sleep disruption can increase risk of accidents, particularly when driving - safe driving practices were discussed  Patient Instructions  Given your symptoms, I am concerned that you may have sleep disordered breathing with sleep apnea. You will need a sleep study for further evaluation. Someone will contact you to schedule this.   We discussed how untreated sleep apnea puts an individual at risk for cardiac arrhthymias, pulm HTN, DM, stroke and increases their risk for daytime accidents. We also briefly reviewed treatment options including weight loss, side sleeping position, oral appliance, CPAP therapy or referral to ENT for possible surgical options  Use caution when driving and pull over if you become sleepy.  Follow up in 6 weeks with Katie Aleli Navedo,NP to go over sleep study results, or sooner, if needed. Friday PM virtual clinic preferred

## 2023-07-28 ENCOUNTER — Ambulatory Visit: Admitting: Student

## 2023-07-30 ENCOUNTER — Ambulatory Visit: Admitting: Student

## 2023-07-30 ENCOUNTER — Encounter: Payer: Self-pay | Admitting: Student

## 2023-07-30 VITALS — BP 125/87 | HR 73 | Ht 61.0 in | Wt 224.0 lb

## 2023-07-30 DIAGNOSIS — R600 Localized edema: Secondary | ICD-10-CM | POA: Diagnosis not present

## 2023-07-30 DIAGNOSIS — I10 Essential (primary) hypertension: Secondary | ICD-10-CM | POA: Diagnosis not present

## 2023-07-30 MED ORDER — VALSARTAN 160 MG PO TABS: 160.0000 mg | ORAL_TABLET | Freq: Every day | ORAL | 3 refills | Status: AC

## 2023-07-30 NOTE — Assessment & Plan Note (Addendum)
 Multifactorial in setting of chronic lymphedema + new right lower extremity injury.  Continue boot per podiatry recommendations. - Trial off amlodipine  - Continue home 20 mg Lasix  as needed for worsening leg swelling - Continue compression stockings - Follow-up with podiatry and vascular surgery

## 2023-07-30 NOTE — Patient Instructions (Addendum)
 It was great to see you! Thank you for allowing me to participate in your care!   I recommend that you always bring your medications to each appointment as this makes it easy to ensure we are on the correct medications and helps us  not miss when refills are needed.  Our plans for today:  - Please take 160 mg of valsartan .  Do not take amlodipine  combo pill. - Continue to use compression socks for swelling. - Follow-up in 2-4 weeks to recheck your blood pressure, if blood pressure is uncontrolled we will need to start a second medication-we may be able to find a combo pill.   Take care and seek immediate care sooner if you develop any concerns. Please remember to show up 15 minutes before your scheduled appointment time!  Lavada Porteous, DO [facility]

## 2023-07-30 NOTE — Progress Notes (Signed)
    SUBJECTIVE:   CHIEF COMPLAINT / HPI:   Leg swelling Right worse than left leg swelling, which is now improving.  Following with podiatry for her foot injury, recently had an Unna boot for 5 days which significantly improved her swelling.  Additionally she has a follow-up appointment with vascular already scheduled for next month.  Her Doppler ultrasound was negative for DVT, and she does continue Eliquis.  She has Lasix  at home, but not taking this.  She is currently taking amlodipine  combination pill, for blood pressure.  Blood pressure is well-controlled, however amlodipine  may also be contributing to her chronic lower extremity edema-which is primarily driven by her lymphedema.  Discussed trying a different medication, as this may help with some swelling.  OBJECTIVE:   BP 125/87   Pulse 73   Ht 5\' 1"  (1.549 m)   Wt 224 lb (101.6 kg)   SpO2 100%   BMI 42.32 kg/m    General: NAD, pleasant, Cardio: RRR, no MRG.  +2 pitting edema bilaterally Respiratory: CTAB, normal wob on RA GI: Abdomen is soft, not tender, not distended. BS present Skin: Warm and dry  ASSESSMENT/PLAN:   Assessment & Plan Bilateral leg edema Multifactorial in setting of chronic lymphedema + new right lower extremity injury.  Continue boot per podiatry recommendations. - Trial off amlodipine  - Continue home 20 mg Lasix  as needed for worsening leg swelling - Continue compression stockings - Follow-up with podiatry and vascular surgery Hypertension, unspecified type Well-controlled, however change in therapy due to leg edema see above. - Valsartan  160 mg daily - Follow-up 2 weeks   Follow-up recommendations If blood pressure elevated, recommend starting hydrochlorothiazide -look for combo pill.  Lavada Porteous, DO [facility]

## 2023-07-30 NOTE — Assessment & Plan Note (Addendum)
 Well-controlled, however change in therapy due to leg edema see above. - Valsartan  160 mg daily - Follow-up 2 weeks

## 2023-08-05 DIAGNOSIS — R0683 Snoring: Secondary | ICD-10-CM

## 2023-08-05 DIAGNOSIS — G4719 Other hypersomnia: Secondary | ICD-10-CM

## 2023-08-06 ENCOUNTER — Other Ambulatory Visit: Payer: Self-pay

## 2023-08-06 DIAGNOSIS — I872 Venous insufficiency (chronic) (peripheral): Secondary | ICD-10-CM

## 2023-08-09 ENCOUNTER — Other Ambulatory Visit: Payer: Self-pay | Admitting: Student

## 2023-08-09 DIAGNOSIS — R600 Localized edema: Secondary | ICD-10-CM

## 2023-08-12 ENCOUNTER — Other Ambulatory Visit: Payer: Self-pay | Admitting: Family Medicine

## 2023-08-12 DIAGNOSIS — G43709 Chronic migraine without aura, not intractable, without status migrainosus: Secondary | ICD-10-CM

## 2023-08-15 ENCOUNTER — Ambulatory Visit (HOSPITAL_COMMUNITY): Admission: RE | Admit: 2023-08-15 | Payer: Self-pay | Source: Ambulatory Visit

## 2023-08-20 ENCOUNTER — Ambulatory Visit (HOSPITAL_COMMUNITY)
Admission: RE | Admit: 2023-08-20 | Discharge: 2023-08-20 | Disposition: A | Discharge status: Home / Self Care | Payer: Self-pay | Source: Ambulatory Visit | Attending: Vascular Surgery | Admitting: Vascular Surgery

## 2023-08-20 DIAGNOSIS — I872 Venous insufficiency (chronic) (peripheral): Secondary | ICD-10-CM

## 2023-08-21 ENCOUNTER — Ambulatory Visit (INDEPENDENT_AMBULATORY_CARE_PROVIDER_SITE_OTHER): Payer: Self-pay | Admitting: Podiatry

## 2023-08-21 ENCOUNTER — Encounter: Payer: Self-pay | Admitting: Podiatry

## 2023-08-21 DIAGNOSIS — M778 Other enthesopathies, not elsewhere classified: Secondary | ICD-10-CM

## 2023-08-21 DIAGNOSIS — I890 Lymphedema, not elsewhere classified: Secondary | ICD-10-CM

## 2023-08-21 NOTE — Progress Notes (Signed)
 Subjective: Chief Complaint  Patient presents with   Foot Pain    RM#11 Follow up on right foot pain patient states is doing much better no longer wearing boot and wants to know if can do at home compression boot.    38 year old female presents the office today with above concerns.  Overall she states she is feeling better and she is not limited.  However she still has swelling and discomfort to the foot.  She is also follow-up with her PCP to help rule out other causes of the swelling, lymphedema.  She does not report any recent injuries or changes.  She does have a pump at home that she uses does not help.  She also wears compression socks.  Will be testing to help when she is asking she can do this on her own at home.  Objective: AAO x3, NAD DP/PT pulses palpable bilaterally, CRT less than 3 seconds Chronic edema present to the right lower extremity.  Unable to appreciate any area pinpoint tenderness.  She has some discomfort to the dorsal aspect of the foot there are no specific tenderness.  Edema still chronic with lymphedema however does appear to be somewhat improved.  There is no erythema or warmth.  There are no open lesions.  No pain with calf compression, swelling, warmth, erythema  Assessment: Chronic swelling, capsulitis  Plan: -All treatment options discussed with the patient including all alternatives, risks, complications.  -Unna boot was applied today.  Precautions were advised and when to remove this. -Continue elevation.  She artery has what seems to be a pump at home she is try compression socks.  Will see her back in a week for Unna boot change. -Referral to lymphedema clinic  No follow-ups on file.  Charity Conch DPM

## 2023-08-26 ENCOUNTER — Ambulatory Visit: Admitting: Student

## 2023-08-26 NOTE — Progress Notes (Deleted)
    SUBJECTIVE:   CHIEF COMPLAINT / HPI:   Hypertension Well-controlled hypertension with amlodipine -valsartan  combination pill.  However due to chronic lymphedema and leg swelling, trialed off amlodipine  to assist with leg swelling.  Patient reports***. Blood pressure today is***.   PERTINENT  PMH / PSH: ***  OBJECTIVE:   There were no vitals taken for this visit.   General: NAD, pleasant Respiratory: CTAB, normal wob on RA GI: Abdomen is soft, not tender, not distended. BS present Skin: Warm and dry  ASSESSMENT/PLAN:   Assessment & Plan Hypertension, unspecified type      Lavada Porteous, DO [facility]

## 2023-08-27 DIAGNOSIS — G4733 Obstructive sleep apnea (adult) (pediatric): Secondary | ICD-10-CM

## 2023-08-29 ENCOUNTER — Ambulatory Visit (INDEPENDENT_AMBULATORY_CARE_PROVIDER_SITE_OTHER): Payer: Self-pay | Admitting: Podiatry

## 2023-08-29 DIAGNOSIS — I890 Lymphedema, not elsewhere classified: Secondary | ICD-10-CM

## 2023-08-29 NOTE — Patient Instructions (Signed)
Unna Boot Care An Unna boot is a type of bandage (dressing) for the foot and leg. The dressing is a wrap made of gauze that is soaked with a medicine called zinc oxide. The gauze may also include other lotions and medicines that help in wound healing, such as calamine. An Unna boot may be used to: Treat open sores (venous ulcers) or graft sites on the foot, heel, or leg. Help with swelling from conditions that affect the veins or lymphatic system (lymphedema). Treat skin conditions such as inflammation caused by poor blood flow (stasis dermatitis). Heal wounds on parts of the body below the hips (lower extremities). The dressing is applied by a health care provider. The gauze is wrapped around your lower extremity in several layers that overlap. These layers usually start at the toes and go up to the knee. A dry outer wrap goes over the medicated wrap for support and pressure (compression). Before applying the Unna boot, your health care provider will clean your leg and foot and may apply an antibiotic. You may be asked to raise (elevate) your leg for a while to reduce swelling before the boot is put on. The boot will dry and harden after it is applied. It may need to be changed or replaced once or twice a week. Follow these instructions at home: Boot care Wear the Unna boot as told by your health care provider. You may need to wear a slipper or shoe over the boot that is one or two sizes larger than normal. Do not stick anything inside the boot to scratch your skin. Doing that increases your risk of infection. Check the skin around the boot every day. Tell your health care provider about any concerns. Keep your Unna boot clean and dry. Check the area around the boot every day for signs of infection. Check for: Redness, swelling, or pain in your foot or toes. Fluid or blood coming from the boot. Warmth. Pus or a bad smell. A rash, itching, or red, swollen areas of skin (hives). Remove the boot  and call your health care provider if you have signs of poor blood flow, such as: Your toes tingle or become numb. Your toes turn cold or turn blue or pale. Your toes are more swollen or painful. You cannot move your toes. Bathing Do not take baths, swim, or use a hot tub until your health care provider approves. Ask your health care provider if you may take showers. You may only be allowed to take sponge baths. If your health care provider says that you can take a bath or shower: Do not let the Unna boot get wet. Cover the boot with a watertight covering when you shower. Keep your leg with the boot out of the bathtub when you take a bath. Activity Rest as told by your health care provider. Do not sit for a long time without moving. Get up to take short walks every 1-2 hours. This will improve blood flow and breathing. Ask for help if you feel weak or unsteady. You may walk with the boot once it has dried. Ask your health care provider how much walking is safe for you. General instructions Take over-the-counter and prescription medicines only as told by your health care provider. Keep your leg elevated above the level of your heart while you are sitting or lying down. This will decrease swelling. Do not sit with your knee bent for long periods of time. Do not use any products that contain nicotine   or tobacco. These products include cigarettes, chewing tobacco, and vaping devices, such as e-cigarettes. If you need help quitting, ask your health care provider. Keep all follow-up visits. Your health care provider will change your boot once or twice a week until it is no longer needed. Contact a health care provider if: Your skin feels itchy inside the boot. You feel burning or have a rash or hives in the boot area. You have a fever or chills. You have any signs of infection. You have more numbness or pain in your foot or toes. The skin on your foot or toes changes colors. This may include the  skin turning blue or pale or having patchy areas with spots. Your boot has been damaged or feels like it no longer fits like it should. This information is not intended to replace advice given to you by your health care provider. Make sure you discuss any questions you have with your health care provider. Document Revised: 07/30/2021 Document Reviewed: 07/30/2021 Elsevier Patient Education  2024 [employer]  

## 2023-08-30 NOTE — Progress Notes (Signed)
 Subjective: Chief Complaint  Patient presents with   Foot Swelling    RM#13 Right foot swelling.    38 year old female presents the office today with above concerns.  She states last week boot was too loose but typically been helpful.  She does not report any ulcerations or any changes.   She does have a pump at home that she uses does not help.  She also wears compression socks.  Will be testing to help when she is asking she can do this on her own at home.  Objective: AAO x3, NAD DP/PT pulses palpable bilaterally, CRT less than 3 seconds Chronic edema present to the right lower extremity.  Unable to appreciate any area pinpoint tenderness.  No specific area of tenderness noted today.  No erythema warmth or any signs of flexion.  No open lesions.  No pain with calf compression, swelling, warmth, erythema  Assessment: Chronic swelling,   Plan: -All treatment options discussed with the patient including all alternatives, risks, complications.  -Unna boot was applied today.  Precautions were advised and when to remove this. -Referral to lymphedema clinic  Return in about 1 week (around 09/05/2023) for unna boot on nurse schedule.  Charity Conch DPM

## 2023-09-05 ENCOUNTER — Encounter: Payer: Self-pay | Admitting: Nurse Practitioner

## 2023-09-05 ENCOUNTER — Telehealth: Payer: Self-pay | Admitting: Nurse Practitioner

## 2023-09-05 DIAGNOSIS — G4719 Other hypersomnia: Secondary | ICD-10-CM

## 2023-09-05 DIAGNOSIS — F5101 Primary insomnia: Secondary | ICD-10-CM

## 2023-09-05 DIAGNOSIS — G4739 Other sleep apnea: Secondary | ICD-10-CM | POA: Insufficient documentation

## 2023-09-05 DIAGNOSIS — R0681 Apnea, not elsewhere classified: Secondary | ICD-10-CM

## 2023-09-05 DIAGNOSIS — G4700 Insomnia, unspecified: Secondary | ICD-10-CM

## 2023-09-05 NOTE — Assessment & Plan Note (Signed)
 Nondiagnostic home sleep study.  Suspicion for underlying sleep apnea remains high given constellation of symptoms including excessive daytime sleepiness, snoring, witnessed apneas, chronic migraine headaches in setting of obesity.  Epworth 11. Will have her complete in-lab diagnostic study with potential titration pending results.    - discussed how weight can impact sleep and risk for sleep disordered breathing - discussed options to assist with weight loss: combination of diet modification, cardiovascular and strength training exercises   - had an extensive discussion regarding the adverse health consequences related to untreated sleep disordered breathing - specifically discussed the risks for hypertension, coronary artery disease, cardiac dysrhythmias, cerebrovascular disease, and diabetes - lifestyle modification discussed   - discussed how sleep disruption can increase risk of accidents, particularly when driving - safe driving practices were discussed  Patient Instructions  Given your symptoms, I am still concerned that you may have sleep disordered breathing with sleep apnea. Your home sleep study was not diagnostic for sleep apnea. Recommend in lab sleep study for further evaluation. Someone will contact you to schedule this.    We discussed how untreated sleep apnea puts an individual at risk for cardiac arrhthymias, pulm HTN, DM, stroke and increases their risk for daytime accidents. We also briefly reviewed treatment options including weight loss, side sleeping position, oral appliance, CPAP therapy or referral to ENT for possible surgical options   Use caution when driving and pull over if you become sleepy.   Follow up after in lab sleep study with Katie Aleisha Paone,NP. If symptoms do not improve or worsen, please contact office for sooner follow up

## 2023-09-05 NOTE — Progress Notes (Signed)
 Patient ID: Jacqueline Woodard, female     DOB: [birth year], 38 y.o.      MRN: [id number]  No chief complaint on file.   Virtual Visit via Video Note  I connected with Jacqueline Woodard on 09/05/23 at  4:00 PM EDT by a video enabled telemedicine application and verified that I am speaking with the correct person using two identifiers.  Location: Patient: Home Provider: Office   I discussed the limitations of evaluation and management by telemedicine and the availability of in person appointments. The patient expressed understanding and agreed to proceed.  History of Present Illness: 38 year old female, never smoker referred for sleep consult 07/2023.  Past medical history significant for hypertension, migraine, obesity, depression, anxiety.   TEST/EVENTS:  08/05/2023 HST: AHI 3.6/h, SpO2 low 84% (4%) 81% (3%)  07/23/2023: Ov with Jasmeet Manton NP Discussed the use of AI scribe software for clinical note transcription with the patient, who gave verbal consent to proceed. Jacqueline Woodard is a 38 year old female who presents for a sleep consult due to snoring, waking up with migraines, and suspected sleep apnea. She experiences snoring and wakes up with migraines. Others have informed her that she appears to stop breathing at times during sleep. She feels tired during the day but denies any episodes of drowsy driving or falling asleep while driving. No sleep paralysis or parasomnias present. She is currently taking trazodone for sleep, which sometimes helps her fall asleep, but she still wakes up a couple of times during the night. Doesn't seem to be as effective. Her weight has fluctuated, with a recent increase of about fifteen to twenty pounds. She goes to bed between 10 PM and midnight.  Typically falls asleep within 30 to 45 minutes.  Wakes at least 2-3 times a night.  Usually gets up around 6 to 8 AM. 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 She  consumes alcohol socially, about once or twice a month, and her caffeine intake is minimal, approximately eight ounces once or twice a week.  Epworth 11  09/05/2023: Today - follow up Patient presents today for follow-up via virtual visit to review home sleep study results.  4% scoring without significant sleep disordered breathing.  Her 3% scoring did show some mild events.  She continues to have difficulties with sleep at night, snoring and migraines.  Has been told that she stops breathing when she sleeps at night.  Does feel tired throughout the day.  No significant change in symptoms since her last visit.  No drowsy driving  Allergies  Allergen Reactions   Shellfish Allergy Anaphylaxis and Swelling    ALL SHELLFISH---SWELLING OF FACE AND THROAT   Vancomycin  Other (See Comments)    Kidney failure   Immunization History  Administered Date(s) Administered   Influenza Inj Mdck Quad Pf 01/01/2017   Influenza,inj,Quad PF,6+ Mos 12/06/2013, 01/28/2018   Influenza-Unspecified 12/31/2016   PFIZER(Purple Top)SARS-COV-2 Vaccination 12/19/2019, 01/09/2020   PPD Test 07/19/2015   Tdap 05/18/2021   Past Medical History:  Diagnosis Date   Chronic migraine    Endometrial polyp    GERD (gastroesophageal reflux disease)    Hereditary lymphedema of legs    chronic --- right > left   History of acute renal failure    in setting cellulitis left lower leg 06/ 2008 and right lower leg cellulitis with sepsis   History of borderline diabetes mellitus    08-29-2017 PER PT DX 2016 was checked again  last year no longer pre-diabetic   History of cellulitis    06/ 2008  left lower leg cellulitis:  12-05-2013  and 06-04-2014-- right lower leg cellulitis w/ sepsis   History of Clostridium difficile infection 08/2006   History of gastric ulcer 2015   History of ovarian cyst    History of sepsis    w/ right lower leg cellulitis 12-05-2013 and 06-04-2014   Microcytic anemia    Mild asthma    INHALER PRN--  LAST USED 2014   Uterine fibroid    Wears glasses     Tobacco History: Social History   Tobacco Use  Smoking Status Never  Smokeless Tobacco Never   Counseling given: Not Answered   Outpatient Medications Prior to Visit  Medication Sig Dispense Refill   albuterol  (VENTOLIN  HFA) 108 (90 Base) MCG/ACT inhaler TAKE 2 PUFFS BY MOUTH EVERY 6 HOURS AS NEEDED FOR WHEEZE OR SHORTNESS OF BREATH 6.7 each 2   amphetamine-dextroamphetamine (ADDERALL XR) 20 MG 24 hr capsule Take 20 mg by mouth daily.     budesonide -formoterol  (SYMBICORT ) 80-4.5 MCG/ACT inhaler INHALE 2 PUFFS INTO THE LUNGS TWICE A DAY 30.6 each 1   DYANAVEL XR 10 MG CHER Take by mouth.     EPINEPHrine  0.3 mg/0.3 mL IJ SOAJ injection Inject 0.3 mg into the muscle as needed for anaphylaxis. 1 each 1   fluticasone  (FLONASE ) 50 MCG/ACT nasal spray SPRAY 2 SPRAYS INTO EACH NOSTRIL EVERY DAY 48 mL 2   furosemide  (LASIX ) 20 MG tablet TAKE 1 TABLET BY MOUTH DAILY FOR 7 DAYS, THEN 1 TABLET DAILY AS NEEDED FOR UP TO 14 DAYS (FOR LEG SWELLING NOT RESPONDING TO COMPRESSION). 21 tablet 0   ibuprofen  (ADVIL ) 800 MG tablet Take 1 tablet (800 mg total) by mouth 3 (three) times daily. 21 tablet 0   lamoTRIgine (LAMICTAL) 200 MG tablet Take 200 mg by mouth at bedtime.     loratadine  (CLARITIN  REDITABS) 10 MG dissolvable tablet Take 1 tablet (10 mg total) by mouth daily. As needed for allergy symptoms 31 tablet 12   LORazepam (ATIVAN) 0.5 MG tablet Take by mouth.     olopatadine  (PATANOL) 0.1 % ophthalmic solution Place 1 drop into both eyes 2 (two) times daily. 5 mL 12   ondansetron  (ZOFRAN ) 4 MG tablet TAKE 1 TABLET BY MOUTH EVERY 8 HOURS AS NEEDED FOR NAUSEA AND VOMITING 20 tablet 1   SUMAtriptan  (IMITREX ) 50 MG tablet Take 1 tablet (50 mg total) by mouth every 2 (two) hours as needed for migraine. May repeat in 2 hours if headache persists or recurs. 10 tablet 1   topiramate  (TOPAMAX ) 25 MG tablet Take 1 tablet (25 mg total) by mouth 2 (two)  times daily. 180 tablet 1   topiramate  (TOPAMAX ) 50 MG tablet Take 1 tablet (50 mg total) by mouth 2 (two) times daily. 180 tablet 3   traZODone (DESYREL) 50 MG tablet Take 100 mg by mouth at bedtime as needed.     valsartan  (DIOVAN ) 160 MG tablet Take 1 tablet (160 mg total) by mouth at bedtime. 90 tablet 3   venlafaxine  XR (EFFEXOR -XR) 75 MG 24 hr capsule Take 1 capsule (75 mg total) by mouth daily with breakfast. 90 capsule 3   No facility-administered medications prior to visit.     Review of Systems:   Constitutional: No night sweats, fevers, chills, or lassitude. +weight gain, fatigue  HEENT: No difficulty swallowing, tooth/dental problems, or sore throat. No sneezing, itching, ear ache, nasal congestion, or  post nasal drip. +headaches CV:  No chest pain, orthopnea, PND, swelling in lower extremities, anasarca, dizziness, palpitations, syncope Resp: +snoring, witnessed apneas; baseline shortness of breath with exertion. No excess mucus or change in color of mucus. No productive or non-productive. No hemoptysis. No wheezing.   GI:  No heartburn, indigestion GU: No nocturia  Skin: No rash, lesions, ulcerations MSK:  No joint pain or swelling.  Neuro: No dizziness or lightheadedness.  Psych: No depression or anxiety. Mood stable. +sleep disturbance  Observations/Objective: Patient is well-developed, well-nourished in no acute distress.  Resting comfortably at home.  No labored breathing.  Speech is clear and coherent with logical content.  Patient is alert and oriented at baseline.   Assessment and Plan: Excessive daytime sleepiness Nondiagnostic home sleep study.  Suspicion for underlying sleep apnea remains high given constellation of symptoms including excessive daytime sleepiness, snoring, witnessed apneas, chronic migraine headaches in setting of obesity.  Epworth 11. Will have her complete in-lab diagnostic study with potential titration pending results.    - discussed how  weight can impact sleep and risk for sleep disordered breathing - discussed options to assist with weight loss: combination of diet modification, cardiovascular and strength training exercises   - had an extensive discussion regarding the adverse health consequences related to untreated sleep disordered breathing - specifically discussed the risks for hypertension, coronary artery disease, cardiac dysrhythmias, cerebrovascular disease, and diabetes - lifestyle modification discussed   - discussed how sleep disruption can increase risk of accidents, particularly when driving - safe driving practices were discussed  Patient Instructions  Given your symptoms, I am still concerned that you may have sleep disordered breathing with sleep apnea. Your home sleep study was not diagnostic for sleep apnea. Recommend in lab sleep study for further evaluation. Someone will contact you to schedule this.    We discussed how untreated sleep apnea puts an individual at risk for cardiac arrhthymias, pulm HTN, DM, stroke and increases their risk for daytime accidents. We also briefly reviewed treatment options including weight loss, side sleeping position, oral appliance, CPAP therapy or referral to ENT for possible surgical options   Use caution when driving and pull over if you become sleepy.   Follow up after in lab sleep study with Katie Sabah Zucco,NP. If symptoms do not improve or worsen, please contact office for sooner follow up    Sleep apnea-like behavior See above   I discussed the assessment and treatment plan with the patient. The patient was provided an opportunity to ask questions and all were answered. The patient agreed with the plan and demonstrated an understanding of the instructions.   The patient was advised to call back or seek an in-person evaluation if the symptoms worsen or if the condition fails to improve as anticipated.  I provided 31 minutes of non-face-to-face time during this  encounter.   Roetta Clarke, NP

## 2023-09-05 NOTE — Patient Instructions (Signed)
 Given your symptoms, I am still concerned that you may have sleep disordered breathing with sleep apnea. Your home sleep study was not diagnostic for sleep apnea. Recommend in lab sleep study for further evaluation. Someone will contact you to schedule this.    We discussed how untreated sleep apnea puts an individual at risk for cardiac arrhthymias, pulm HTN, DM, stroke and increases their risk for daytime accidents. We also briefly reviewed treatment options including weight loss, side sleeping position, oral appliance, CPAP therapy or referral to ENT for possible surgical options   Use caution when driving and pull over if you become sleepy.   Follow up after in lab sleep study with Jacqueline Lenn Volker,NP. If symptoms do not improve or worsen, please contact office for sooner follow up

## 2023-09-05 NOTE — Assessment & Plan Note (Signed)
 See above

## 2023-09-08 ENCOUNTER — Telehealth: Payer: Self-pay

## 2023-09-08 NOTE — Telephone Encounter (Signed)
 Referral, office note and demographics faxed to [facility] [phone], fax [phone]

## 2023-09-08 NOTE — Telephone Encounter (Signed)
-----   Message from Donnice JONELLE Fees sent at 09/01/2023  8:10 PM EDT ----- So I put in a referral for them. They sent a message stating they would need to be referred to the outpatient Rehab Clinic at [facility] or [facility]. Can you see if the patient would like to go to one of these locations for lymphedema? Thank you! ----- Message ----- From: Lorren Moats, CMA Sent: 08/27/2023   9:15 AM EDT To: Donnice JONELLE Fees, DPM  The one I am familiar with is [facility] Phone [phone], fax [phone] ----- Message ----- From: Fees Donnice JONELLE, DPM Sent: 08/24/2023  10:20 AM EDT To: Moats Lorren, CMA  Do you know if [facility] still has the lymphedema clinic? I could not find how to do the referral.

## 2023-09-08 NOTE — Progress Notes (Unsigned)
 Office Note     CC:  follow up Requesting Provider:  Rosalynn Camie CROME, MD  HPI: Jacqueline Woodard is a 38 y.o. (1985/10/19) female who has been referred to our office by her PCP for evaluation of bilateral lower extremity edema.  She was seen by Dr. Oris in clinic in 2013 for the same.  She has a known diagnosis of lymphedema affecting both legs.  She has no history of DVT, significant trauma, or any prior vascular interventions.  She ***wears compression stockings.  She ***elevate her legs periodically during the day.  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  She denies tobacco use.   Past Medical History:  Diagnosis Date   Chronic migraine    Endometrial polyp    GERD (gastroesophageal reflux disease)    Hereditary lymphedema of legs    chronic --- right > left   History of acute renal failure    in setting cellulitis left lower leg 06/ 2008 and right lower leg cellulitis with sepsis   History of borderline diabetes mellitus    08-29-2017 PER PT DX 2016 was checked again last year no longer pre-diabetic   History of cellulitis    06/ 2008  left lower leg cellulitis:  12-05-2013  and 06-04-2014-- right lower leg cellulitis w/ sepsis   History of Clostridium difficile infection 08/2006   History of gastric ulcer 2015   History of ovarian cyst    History of sepsis    w/ right lower leg cellulitis 12-05-2013 and 06-04-2014   Microcytic anemia    Mild asthma    INHALER PRN-- LAST USED 2014   Uterine fibroid    Wears glasses     Past Surgical History:  Procedure Laterality Date   BREAST REDUCTION SURGERY Bilateral 03/01/2013   Procedure: BILATERAL MAMMARY REDUCTION  (BREAST);  Surgeon: Elna Pick, MD;  Location: [facility];  Service: Plastics;  Laterality: Bilateral;   BREAST SURGERY Bilateral 2015   Breast reduction   DILATATION & CURETTAGE/HYSTEROSCOPY WITH MYOSURE N/A 09/05/2017   Procedure: DILATATION &  CURETTAGE/HYSTEROSCOPY WITH MYOSURE;  Surgeon: Rockney Evalene SQUIBB, MD;  Location: [facility];  Service: Gynecology;  Laterality: N/A;  requests 7:30am OR time Requests one hour   DILATION AND CURETTAGE OF UTERUS  09/05/2017   Hyperplastic uterus, uterine polyp removal, irregular bleeding   WISDOM TOOTH EXTRACTION      Social History   Socioeconomic History   Marital status: Single    Spouse name: Not on file   Number of children: Not on file   Years of education: Not on file   Highest education level: Associate degree: occupational, Scientist, product/process development, or vocational program  Occupational History   Not on file  Tobacco Use   Smoking status: Never   Smokeless tobacco: Never  Vaping Use   Vaping status: Never Used  Substance and Sexual Activity   Alcohol use: Yes    Comment: Social   Drug use: No   Sexual activity: Yes    Partners: Female    Birth control/protection: I.U.D.    Comment: 1st intercourse 69 yo-5 partners  Other Topics Concern   Not on file  Social History Narrative   Not on file   Social Drivers of Health   Financial Resource Strain: Medium Risk (05/22/2023)   Overall Financial Resource Strain (CARDIA)    Difficulty of Paying Living Expenses: Somewhat hard  Food Insecurity:  No Food Insecurity (05/22/2023)   Hunger Vital Sign    Worried About Running Out of Food in the Last Year: Never true    Ran Out of Food in the Last Year: Never true  Transportation Needs: No Transportation Needs (05/22/2023)   PRAPARE - Administrator, Civil Service (Medical): No    Lack of Transportation (Non-Medical): No  Physical Activity: Unknown (05/22/2023)   Exercise Vital Sign    Days of Exercise per Week: 0 days    Minutes of Exercise per Session: Not on file  Stress: Stress Concern Present (05/22/2023)   [employer] of Occupational Health - Occupational Stress Questionnaire    Feeling of Stress : Very much  Social Connections: Socially Isolated (05/22/2023)    Social Connection and Isolation Panel    Frequency of Communication with Friends and Family: More than three times a week    Frequency of Social Gatherings with Friends and Family: Never    Attends Religious Services: Never    Database administrator or Organizations: No    Attends Engineer, structural: Not on file    Marital Status: Never married  Catering manager Violence: Not on file    Family History  Problem Relation Age of Onset   Diabetes Mother    Thyroid  disease Mother    Fibroids Mother    Asthma Father    Edema Father    Hypertension Paternal Aunt    Hypertension Paternal Aunt    Diabetes Maternal Grandmother    Hypertension Maternal Grandmother    Hypertension Maternal Grandfather    Diabetes Paternal Grandmother    Hypertension Paternal Grandmother    Diabetes Paternal Grandfather    Hypertension Paternal Grandfather     Current Outpatient Medications  Medication Sig Dispense Refill   albuterol  (VENTOLIN  HFA) 108 (90 Base) MCG/ACT inhaler TAKE 2 PUFFS BY MOUTH EVERY 6 HOURS AS NEEDED FOR WHEEZE OR SHORTNESS OF BREATH 6.7 each 2   amphetamine-dextroamphetamine (ADDERALL XR) 20 MG 24 hr capsule Take 20 mg by mouth daily.     budesonide -formoterol  (SYMBICORT ) 80-4.5 MCG/ACT inhaler INHALE 2 PUFFS INTO THE LUNGS TWICE A DAY 30.6 each 1   DYANAVEL XR 10 MG CHER Take by mouth.     EPINEPHrine  0.3 mg/0.3 mL IJ SOAJ injection Inject 0.3 mg into the muscle as needed for anaphylaxis. 1 each 1   fluticasone  (FLONASE ) 50 MCG/ACT nasal spray SPRAY 2 SPRAYS INTO EACH NOSTRIL EVERY DAY 48 mL 2   furosemide  (LASIX ) 20 MG tablet TAKE 1 TABLET BY MOUTH DAILY FOR 7 DAYS, THEN 1 TABLET DAILY AS NEEDED FOR UP TO 14 DAYS (FOR LEG SWELLING NOT RESPONDING TO COMPRESSION). 21 tablet 0   ibuprofen  (ADVIL ) 800 MG tablet Take 1 tablet (800 mg total) by mouth 3 (three) times daily. 21 tablet 0   lamoTRIgine (LAMICTAL) 200 MG tablet Take 200 mg by mouth at bedtime.     loratadine   (CLARITIN  REDITABS) 10 MG dissolvable tablet Take 1 tablet (10 mg total) by mouth daily. As needed for allergy symptoms 31 tablet 12   LORazepam (ATIVAN) 0.5 MG tablet Take by mouth.     olopatadine  (PATANOL) 0.1 % ophthalmic solution Place 1 drop into both eyes 2 (two) times daily. 5 mL 12   ondansetron  (ZOFRAN ) 4 MG tablet TAKE 1 TABLET BY MOUTH EVERY 8 HOURS AS NEEDED FOR NAUSEA AND VOMITING 20 tablet 1   SUMAtriptan  (IMITREX ) 50 MG tablet Take 1 tablet (50 mg total) by  mouth every 2 (two) hours as needed for migraine. May repeat in 2 hours if headache persists or recurs. 10 tablet 1   topiramate  (TOPAMAX ) 25 MG tablet Take 1 tablet (25 mg total) by mouth 2 (two) times daily. 180 tablet 1   topiramate  (TOPAMAX ) 50 MG tablet Take 1 tablet (50 mg total) by mouth 2 (two) times daily. 180 tablet 3   traZODone (DESYREL) 50 MG tablet Take 100 mg by mouth at bedtime as needed.     valsartan  (DIOVAN ) 160 MG tablet Take 1 tablet (160 mg total) by mouth at bedtime. 90 tablet 3   venlafaxine  XR (EFFEXOR -XR) 75 MG 24 hr capsule Take 1 capsule (75 mg total) by mouth daily with breakfast. 90 capsule 3   No current facility-administered medications for this visit.    Allergies  Allergen Reactions   Shellfish Allergy Anaphylaxis and Swelling    ALL SHELLFISH---SWELLING OF FACE AND THROAT   Vancomycin  Other (See Comments)    Kidney failure     REVIEW OF SYSTEMS:   [X]  denotes positive finding, [ ]  denotes negative finding Cardiac  Comments:  Chest pain or chest pressure:    Shortness of breath upon exertion:    Short of breath when lying flat:    Irregular heart rhythm:        Vascular    Pain in calf, thigh, or hip brought on by ambulation:    Pain in feet at night that wakes you up from your sleep:     Blood clot in your veins:    Leg swelling:         Pulmonary    Oxygen at home:    Productive cough:     Wheezing:         Neurologic    Sudden weakness in arms or legs:     Sudden  numbness in arms or legs:     Sudden onset of difficulty speaking or slurred speech:    Temporary loss of vision in one eye:     Problems with dizziness:         Gastrointestinal    Blood in stool:     Vomited blood:         Genitourinary    Burning when urinating:     Blood in urine:        Psychiatric    Major depression:         Hematologic    Bleeding problems:    Problems with blood clotting too easily:        Skin    Rashes or ulcers:        Constitutional    Fever or chills:      PHYSICAL EXAMINATION:  There were no vitals filed for this visit.  General:  WDWN in NAD; vital signs documented above Gait: Not observed HENT: WNL, normocephalic Pulmonary: normal non-labored breathing Cardiac: regular HR Abdomen: soft, NT, no masses Skin: without rashes Vascular Exam/Pulses: *** Extremities: {With/Without:20273} ischemic changes, {With/Without:20273} Gangrene , {With/Without:20273} cellulitis; {With/Without:20273} open wounds;  Musculoskeletal: no muscle wasting or atrophy  Neurologic: A&O X 3 Psychiatric:  The pt has Normal affect.   Non-Invasive Vascular Imaging:   Right lower extremity venous reflux study negative for DVT, negative for deep venous reflux, negative for superficial venous reflux in the GSV or SSV    ASSESSMENT/PLAN:: 38 y.o. female referred back to our office for evaluation of bilateral lower extremity edema  Ms. Ilani Otterson is a 38 year old female  with known history of lymphedema affecting bilateral lower extremities.  She was seen in our office many years ago for the same reason.  She had a right lower extremity venous reflux study which was negative for DVT, deep venous reflux, and superficial venous reflux.  Unfortunately vascular surgery has nothing further to offer.  She would benefit from referral to the lymphedema clinic for treatment as well as to obtain lymphedema pumps.  I stressed the importance of daily use of compression socks,  periodic leg elevation, and avoidance of prolonged sitting and standing.  ***   Donnice Sender, PA-C [facility] [phone]  Clinic MD:   ***

## 2023-09-09 ENCOUNTER — Ambulatory Visit: Payer: Self-pay | Attending: Surgery | Admitting: Physician Assistant

## 2023-09-09 ENCOUNTER — Other Ambulatory Visit: Payer: Self-pay | Admitting: Family Medicine

## 2023-09-09 VITALS — BP 102/71 | HR 100 | Temp 97.9°F | Ht 61.0 in | Wt 216.4 lb

## 2023-09-09 DIAGNOSIS — I872 Venous insufficiency (chronic) (peripheral): Secondary | ICD-10-CM

## 2023-09-09 DIAGNOSIS — I890 Lymphedema, not elsewhere classified: Secondary | ICD-10-CM

## 2023-11-04 ENCOUNTER — Ambulatory Visit (HOSPITAL_BASED_OUTPATIENT_CLINIC_OR_DEPARTMENT_OTHER): Payer: Self-pay | Admitting: Internal Medicine
